# Patient Record
Sex: Male | Born: 1945
Health system: Southern US, Community
[De-identification: ages and names within clinical notes are randomized; demographics above are authoritative.]

## PROBLEM LIST (undated history)

## (undated) DIAGNOSIS — C801 Malignant (primary) neoplasm, unspecified: Secondary | ICD-10-CM

## (undated) DIAGNOSIS — M79609 Pain in unspecified limb: Secondary | ICD-10-CM

## (undated) DIAGNOSIS — K5100 Ulcerative (chronic) pancolitis without complications: Secondary | ICD-10-CM

## (undated) DIAGNOSIS — J45909 Unspecified asthma, uncomplicated: Secondary | ICD-10-CM

## (undated) DIAGNOSIS — F988 Other specified behavioral and emotional disorders with onset usually occurring in childhood and adolescence: Secondary | ICD-10-CM

## (undated) DIAGNOSIS — E785 Hyperlipidemia, unspecified: Secondary | ICD-10-CM

## (undated) DIAGNOSIS — N529 Male erectile dysfunction, unspecified: Secondary | ICD-10-CM

## (undated) DIAGNOSIS — T8852XA Failed moderate sedation during procedure, initial encounter: Secondary | ICD-10-CM

## (undated) DIAGNOSIS — R011 Cardiac murmur, unspecified: Secondary | ICD-10-CM

## (undated) DIAGNOSIS — K219 Gastro-esophageal reflux disease without esophagitis: Secondary | ICD-10-CM

## (undated) DIAGNOSIS — K589 Irritable bowel syndrome without diarrhea: Secondary | ICD-10-CM

## (undated) DIAGNOSIS — F329 Major depressive disorder, single episode, unspecified: Secondary | ICD-10-CM

## (undated) DIAGNOSIS — F411 Generalized anxiety disorder: Secondary | ICD-10-CM

## (undated) DIAGNOSIS — I10 Essential (primary) hypertension: Secondary | ICD-10-CM

## (undated) DIAGNOSIS — M25559 Pain in unspecified hip: Secondary | ICD-10-CM

## (undated) DIAGNOSIS — M899 Disorder of bone, unspecified: Secondary | ICD-10-CM

## (undated) DIAGNOSIS — M949 Disorder of cartilage, unspecified: Secondary | ICD-10-CM

## (undated) DIAGNOSIS — R091 Pleurisy: Secondary | ICD-10-CM

## (undated) DIAGNOSIS — M1990 Unspecified osteoarthritis, unspecified site: Secondary | ICD-10-CM

## (undated) DIAGNOSIS — R079 Chest pain, unspecified: Secondary | ICD-10-CM

## (undated) DIAGNOSIS — S2249XA Multiple fractures of ribs, unspecified side, initial encounter for closed fracture: Secondary | ICD-10-CM

## (undated) DIAGNOSIS — N182 Chronic kidney disease, stage 2 (mild): Secondary | ICD-10-CM

## (undated) HISTORY — DX: Essential (primary) hypertension: I10

## (undated) HISTORY — DX: Pain in unspecified hip: M25.559

## (undated) HISTORY — DX: Multiple fractures of ribs, unspecified side, initial encounter for closed fracture: S22.49XA

## (undated) HISTORY — DX: Chronic kidney disease, stage 2 (mild): N18.2

## (undated) HISTORY — DX: Generalized anxiety disorder: F41.1

## (undated) HISTORY — DX: Unspecified asthma, uncomplicated: J45.909

## (undated) HISTORY — DX: Malignant (primary) neoplasm, unspecified: C80.1

## (undated) HISTORY — DX: Chest pain, unspecified: R07.9

## (undated) HISTORY — DX: Unspecified osteoarthritis, unspecified site: M19.90

## (undated) HISTORY — DX: Irritable bowel syndrome without diarrhea: K58.9

## (undated) HISTORY — DX: Failed moderate sedation during procedure, initial encounter: T88.52XA

## (undated) HISTORY — DX: Pain in unspecified limb: M79.609

## (undated) HISTORY — PX: COLONOSCOPY W/ BIOPSIES: SHX1374

## (undated) HISTORY — DX: Disorder of bone, unspecified: M89.9

## (undated) HISTORY — DX: Hyperlipidemia, unspecified: E78.5

## (undated) HISTORY — DX: Cardiac murmur, unspecified: R01.1

## (undated) HISTORY — DX: Other specified behavioral and emotional disorders with onset usually occurring in childhood and adolescence: F98.8

## (undated) HISTORY — DX: Gastro-esophageal reflux disease without esophagitis: K21.9

## (undated) HISTORY — PX: BASAL CELL CARCINOMA EXCISION: SHX1214

## (undated) HISTORY — DX: Disorder of cartilage, unspecified: M94.9

## (undated) HISTORY — DX: Major depressive disorder, single episode, unspecified: F32.9

## (undated) HISTORY — DX: Male erectile dysfunction, unspecified: N52.9

## (undated) HISTORY — PX: TONSILLECTOMY: SUR1361

## (undated) HISTORY — DX: Ulcerative (chronic) pancolitis without complications: K51.00

---

## 1990-12-30 HISTORY — PX: LUMBAR DISC SURGERY: SHX700

## 1996-12-30 HISTORY — PX: INCISION AND DRAINAGE PERIRECTAL ABSCESS: SHX1804

## 1998-11-08 ENCOUNTER — Encounter: Payer: Self-pay | Admitting: Internal Medicine

## 1998-11-13 ENCOUNTER — Encounter: Payer: Self-pay | Admitting: Internal Medicine

## 1998-11-28 ENCOUNTER — Encounter: Payer: Self-pay | Admitting: Nephrology

## 1998-11-28 ENCOUNTER — Ambulatory Visit (HOSPITAL_COMMUNITY): Admission: RE | Admit: 1998-11-28 | Discharge: 1998-11-28 | Payer: Self-pay | Admitting: Nephrology

## 2000-02-07 ENCOUNTER — Ambulatory Visit (HOSPITAL_COMMUNITY): Admission: RE | Admit: 2000-02-07 | Discharge: 2000-02-07 | Payer: Self-pay | Admitting: Gastroenterology

## 2000-02-07 ENCOUNTER — Encounter (INDEPENDENT_AMBULATORY_CARE_PROVIDER_SITE_OTHER): Payer: Self-pay | Admitting: *Deleted

## 2001-06-29 HISTORY — PX: UPPER GASTROINTESTINAL ENDOSCOPY: SHX188

## 2001-07-14 ENCOUNTER — Encounter (INDEPENDENT_AMBULATORY_CARE_PROVIDER_SITE_OTHER): Payer: Self-pay | Admitting: Specialist

## 2001-07-14 ENCOUNTER — Encounter: Payer: Self-pay | Admitting: Internal Medicine

## 2001-07-14 ENCOUNTER — Other Ambulatory Visit: Admission: RE | Admit: 2001-07-14 | Discharge: 2001-07-14 | Payer: Self-pay | Admitting: Gastroenterology

## 2002-01-30 HISTORY — PX: ELBOW SURGERY: SHX618

## 2004-11-13 ENCOUNTER — Ambulatory Visit: Payer: Self-pay | Admitting: Internal Medicine

## 2004-11-16 ENCOUNTER — Ambulatory Visit: Payer: Self-pay | Admitting: Family Medicine

## 2004-11-20 ENCOUNTER — Encounter: Admission: RE | Admit: 2004-11-20 | Discharge: 2004-11-20 | Payer: Self-pay | Admitting: Family Medicine

## 2004-11-23 ENCOUNTER — Inpatient Hospital Stay (HOSPITAL_COMMUNITY): Admission: EM | Admit: 2004-11-23 | Discharge: 2004-11-25 | Payer: Self-pay | Admitting: Internal Medicine

## 2004-11-23 ENCOUNTER — Ambulatory Visit: Payer: Self-pay | Admitting: Internal Medicine

## 2004-11-28 ENCOUNTER — Encounter: Admission: RE | Admit: 2004-11-28 | Discharge: 2004-11-28 | Payer: Self-pay | Admitting: Internal Medicine

## 2004-12-03 ENCOUNTER — Ambulatory Visit: Payer: Self-pay | Admitting: Internal Medicine

## 2004-12-10 ENCOUNTER — Ambulatory Visit: Payer: Self-pay | Admitting: Internal Medicine

## 2005-01-16 ENCOUNTER — Ambulatory Visit: Payer: Self-pay | Admitting: Internal Medicine

## 2005-04-26 ENCOUNTER — Ambulatory Visit: Payer: Self-pay | Admitting: Internal Medicine

## 2005-05-31 ENCOUNTER — Ambulatory Visit: Payer: Self-pay | Admitting: Internal Medicine

## 2005-10-16 ENCOUNTER — Encounter: Admission: RE | Admit: 2005-10-16 | Discharge: 2005-10-16 | Payer: Self-pay | Admitting: Internal Medicine

## 2006-04-14 ENCOUNTER — Ambulatory Visit: Payer: Self-pay | Admitting: Internal Medicine

## 2006-07-08 ENCOUNTER — Ambulatory Visit: Payer: Self-pay | Admitting: Internal Medicine

## 2006-08-07 ENCOUNTER — Ambulatory Visit: Payer: Self-pay | Admitting: Internal Medicine

## 2006-08-13 ENCOUNTER — Encounter: Payer: Self-pay | Admitting: Internal Medicine

## 2006-08-14 ENCOUNTER — Ambulatory Visit: Payer: Self-pay | Admitting: Internal Medicine

## 2006-08-27 ENCOUNTER — Ambulatory Visit: Payer: Self-pay | Admitting: Psychology

## 2006-09-10 ENCOUNTER — Ambulatory Visit: Payer: Self-pay | Admitting: Gastroenterology

## 2006-09-10 ENCOUNTER — Ambulatory Visit: Payer: Self-pay | Admitting: Psychology

## 2006-09-15 ENCOUNTER — Ambulatory Visit: Payer: Self-pay | Admitting: Internal Medicine

## 2006-09-25 ENCOUNTER — Ambulatory Visit: Payer: Self-pay | Admitting: Gastroenterology

## 2006-09-25 ENCOUNTER — Encounter (INDEPENDENT_AMBULATORY_CARE_PROVIDER_SITE_OTHER): Payer: Self-pay | Admitting: *Deleted

## 2006-09-25 ENCOUNTER — Encounter: Payer: Self-pay | Admitting: Internal Medicine

## 2006-09-29 ENCOUNTER — Ambulatory Visit: Payer: Self-pay | Admitting: Psychology

## 2006-10-08 ENCOUNTER — Ambulatory Visit: Payer: Self-pay | Admitting: Psychology

## 2006-10-28 ENCOUNTER — Ambulatory Visit: Payer: Self-pay | Admitting: Psychology

## 2006-11-05 ENCOUNTER — Ambulatory Visit: Payer: Self-pay | Admitting: Gastroenterology

## 2006-11-06 ENCOUNTER — Ambulatory Visit: Payer: Self-pay | Admitting: Psychology

## 2006-12-10 ENCOUNTER — Ambulatory Visit: Payer: Self-pay | Admitting: Psychology

## 2006-12-16 ENCOUNTER — Ambulatory Visit: Payer: Self-pay | Admitting: Internal Medicine

## 2006-12-18 ENCOUNTER — Ambulatory Visit: Payer: Self-pay | Admitting: Psychology

## 2006-12-26 ENCOUNTER — Ambulatory Visit: Payer: Self-pay | Admitting: Psychology

## 2007-01-21 ENCOUNTER — Ambulatory Visit: Payer: Self-pay | Admitting: Psychology

## 2007-02-04 ENCOUNTER — Ambulatory Visit: Payer: Self-pay | Admitting: Psychology

## 2007-02-19 ENCOUNTER — Ambulatory Visit: Payer: Self-pay | Admitting: Psychology

## 2007-02-24 ENCOUNTER — Ambulatory Visit: Payer: Self-pay | Admitting: Internal Medicine

## 2007-03-04 ENCOUNTER — Ambulatory Visit: Payer: Self-pay | Admitting: Psychology

## 2007-03-27 ENCOUNTER — Ambulatory Visit: Payer: Self-pay | Admitting: Internal Medicine

## 2007-04-02 ENCOUNTER — Ambulatory Visit: Payer: Self-pay | Admitting: Psychology

## 2007-04-09 ENCOUNTER — Ambulatory Visit: Payer: Self-pay

## 2007-04-16 ENCOUNTER — Ambulatory Visit: Payer: Self-pay | Admitting: Psychology

## 2007-04-30 ENCOUNTER — Ambulatory Visit: Payer: Self-pay | Admitting: Psychology

## 2007-05-05 ENCOUNTER — Ambulatory Visit: Payer: Self-pay | Admitting: Internal Medicine

## 2007-05-18 ENCOUNTER — Ambulatory Visit: Payer: Self-pay | Admitting: Psychology

## 2007-06-11 ENCOUNTER — Ambulatory Visit: Payer: Self-pay | Admitting: Psychology

## 2007-07-09 ENCOUNTER — Ambulatory Visit: Payer: Self-pay | Admitting: Psychology

## 2007-08-03 DIAGNOSIS — I10 Essential (primary) hypertension: Secondary | ICD-10-CM

## 2007-08-03 DIAGNOSIS — F3289 Other specified depressive episodes: Secondary | ICD-10-CM

## 2007-08-03 DIAGNOSIS — E785 Hyperlipidemia, unspecified: Secondary | ICD-10-CM

## 2007-08-03 DIAGNOSIS — F411 Generalized anxiety disorder: Secondary | ICD-10-CM

## 2007-08-03 DIAGNOSIS — K219 Gastro-esophageal reflux disease without esophagitis: Secondary | ICD-10-CM | POA: Insufficient documentation

## 2007-08-03 DIAGNOSIS — F329 Major depressive disorder, single episode, unspecified: Secondary | ICD-10-CM

## 2007-08-03 DIAGNOSIS — J45909 Unspecified asthma, uncomplicated: Secondary | ICD-10-CM

## 2007-08-03 DIAGNOSIS — F339 Major depressive disorder, recurrent, unspecified: Secondary | ICD-10-CM | POA: Insufficient documentation

## 2007-08-03 HISTORY — DX: Essential (primary) hypertension: I10

## 2007-08-03 HISTORY — DX: Unspecified asthma, uncomplicated: J45.909

## 2007-08-03 HISTORY — DX: Generalized anxiety disorder: F41.1

## 2007-08-03 HISTORY — DX: Other specified depressive episodes: F32.89

## 2007-08-03 HISTORY — DX: Hyperlipidemia, unspecified: E78.5

## 2007-08-03 HISTORY — DX: Major depressive disorder, single episode, unspecified: F32.9

## 2007-08-03 HISTORY — DX: Gastro-esophageal reflux disease without esophagitis: K21.9

## 2007-08-06 ENCOUNTER — Ambulatory Visit: Payer: Self-pay | Admitting: Psychology

## 2007-08-13 ENCOUNTER — Ambulatory Visit: Payer: Self-pay | Admitting: Internal Medicine

## 2007-08-13 LAB — CONVERTED CEMR LAB
AST: 22 units/L (ref 0–37)
Albumin: 3.6 g/dL (ref 3.5–5.2)
Alkaline Phosphatase: 37 units/L — ABNORMAL LOW (ref 39–117)
BUN: 29 mg/dL — ABNORMAL HIGH (ref 6–23)
Basophils Absolute: 0 10*3/uL (ref 0.0–0.1)
Basophils Relative: 0.1 % (ref 0.0–1.0)
Bilirubin Urine: NEGATIVE
Bilirubin, Direct: 0.1 mg/dL (ref 0.0–0.3)
CO2: 34 meq/L — ABNORMAL HIGH (ref 19–32)
Calcium: 9.2 mg/dL (ref 8.4–10.5)
Chloride: 105 meq/L (ref 96–112)
Cholesterol: 199 mg/dL (ref 0–200)
Creatinine, Ser: 1.6 mg/dL — ABNORMAL HIGH (ref 0.4–1.5)
Eosinophils Absolute: 0.3 10*3/uL (ref 0.0–0.6)
Eosinophils Relative: 3.8 % (ref 0.0–5.0)
GFR calc Af Amer: 57 mL/min
GFR calc non Af Amer: 47 mL/min
Glucose, Bld: 86 mg/dL (ref 70–99)
Glucose, Urine, Semiquant: NEGATIVE
HDL: 55.9 mg/dL (ref >39.0)
Hemoglobin: 14.6 g/dL (ref 13.0–17.0)
Ketones, urine, test strip: NEGATIVE
LDL Cholesterol: 121 mg/dL — ABNORMAL HIGH (ref 0–99)
MCHC: 34.6 g/dL (ref 30.0–36.0)
MCV: 91.8 fL (ref 78.0–100.0)
Monocytes Absolute: 0.9 10*3/uL — ABNORMAL HIGH (ref 0.2–0.7)
Monocytes Relative: 10.8 % (ref 3.0–11.0)
Neutro Abs: 3.9 10*3/uL (ref 1.4–7.7)
Neutrophils Relative %: 47.1 % (ref 43.0–77.0)
Nitrite: NEGATIVE
PSA: 1.35 ng/mL (ref 0.10–4.00)
Platelets: 200 10*3/uL (ref 150–400)
Potassium: 3.9 meq/L (ref 3.5–5.1)
Protein, U semiquant: NEGATIVE
RDW: 12.2 % (ref 11.5–14.6)
Sodium: 144 meq/L (ref 135–145)
TSH: 1.74 microintl units/mL (ref 0.35–5.50)
Total Bilirubin: 0.7 mg/dL (ref 0.3–1.2)
Total CHOL/HDL Ratio: 3.6
Triglycerides: 111 mg/dL (ref 0–149)
Urobilinogen, UA: 0.2
VLDL: 22 mg/dL (ref 0–40)
WBC Urine, dipstick: NEGATIVE
WBC: 8.3 10*3/uL (ref 4.5–10.5)
pH: 6.5

## 2007-08-20 ENCOUNTER — Ambulatory Visit: Payer: Self-pay | Admitting: Internal Medicine

## 2007-08-26 ENCOUNTER — Ambulatory Visit: Payer: Self-pay | Admitting: Psychology

## 2007-09-04 ENCOUNTER — Ambulatory Visit: Payer: Self-pay | Admitting: Internal Medicine

## 2007-09-04 ENCOUNTER — Encounter: Payer: Self-pay | Admitting: Internal Medicine

## 2007-09-14 ENCOUNTER — Ambulatory Visit: Payer: Self-pay | Admitting: Psychology

## 2007-09-30 HISTORY — PX: SHOULDER SURGERY: SHX246

## 2007-12-10 ENCOUNTER — Telehealth: Payer: Self-pay | Admitting: Internal Medicine

## 2008-01-05 ENCOUNTER — Encounter: Payer: Self-pay | Admitting: Internal Medicine

## 2008-01-12 ENCOUNTER — Telehealth: Payer: Self-pay | Admitting: Internal Medicine

## 2008-02-03 ENCOUNTER — Telehealth: Payer: Self-pay | Admitting: Internal Medicine

## 2008-02-23 ENCOUNTER — Ambulatory Visit: Payer: Self-pay | Admitting: Internal Medicine

## 2008-02-23 DIAGNOSIS — N529 Male erectile dysfunction, unspecified: Secondary | ICD-10-CM | POA: Insufficient documentation

## 2008-02-23 DIAGNOSIS — F988 Other specified behavioral and emotional disorders with onset usually occurring in childhood and adolescence: Secondary | ICD-10-CM

## 2008-02-23 HISTORY — DX: Other specified behavioral and emotional disorders with onset usually occurring in childhood and adolescence: F98.8

## 2008-02-23 HISTORY — DX: Male erectile dysfunction, unspecified: N52.9

## 2008-02-25 ENCOUNTER — Telehealth: Payer: Self-pay | Admitting: Internal Medicine

## 2008-02-25 LAB — CONVERTED CEMR LAB: Testosterone: 251.61 ng/dL — ABNORMAL LOW (ref 350.00–890)

## 2008-05-09 ENCOUNTER — Ambulatory Visit: Payer: Self-pay | Admitting: Internal Medicine

## 2008-05-27 ENCOUNTER — Ambulatory Visit: Payer: Self-pay | Admitting: Internal Medicine

## 2008-05-27 DIAGNOSIS — M25559 Pain in unspecified hip: Secondary | ICD-10-CM

## 2008-05-27 DIAGNOSIS — M5136 Other intervertebral disc degeneration, lumbar region: Secondary | ICD-10-CM | POA: Insufficient documentation

## 2008-05-27 HISTORY — DX: Pain in unspecified hip: M25.559

## 2008-05-27 LAB — CONVERTED CEMR LAB
ALT: 24 units/L (ref 0–53)
AST: 28 units/L (ref 0–37)
Albumin: 3.8 g/dL (ref 3.5–5.2)
BUN: 24 mg/dL — ABNORMAL HIGH (ref 6–23)
Basophils Absolute: 0 10*3/uL (ref 0.0–0.1)
Basophils Relative: 0.5 % (ref 0.0–1.0)
CO2: 32 meq/L (ref 19–32)
Calcium: 9.3 mg/dL (ref 8.4–10.5)
Chloride: 99 meq/L (ref 96–112)
Creatinine, Ser: 1.6 mg/dL — ABNORMAL HIGH (ref 0.4–1.5)
Eosinophils Relative: 0.1 % (ref 0.0–5.0)
GFR calc Af Amer: 57 mL/min
GFR calc non Af Amer: 47 mL/min
Glucose, Bld: 103 mg/dL — ABNORMAL HIGH (ref 70–99)
HCT: 42.7 % (ref 39.0–52.0)
Hemoglobin: 14.7 g/dL (ref 13.0–17.0)
Lymphocytes Relative: 15.6 % (ref 12.0–46.0)
MCHC: 34.5 g/dL (ref 30.0–36.0)
Monocytes Relative: 6.6 % (ref 3.0–12.0)
Neutro Abs: 7.1 10*3/uL (ref 1.4–7.7)
Neutrophils Relative %: 77.2 % — ABNORMAL HIGH (ref 43.0–77.0)
Platelets: 217 10*3/uL (ref 150–400)
RBC: 4.64 M/uL (ref 4.22–5.81)
RDW: 12.2 % (ref 11.5–14.6)
Sodium: 140 meq/L (ref 135–145)
Total Bilirubin: 1.1 mg/dL (ref 0.3–1.2)
WBC: 9.1 10*3/uL (ref 4.5–10.5)

## 2008-05-31 ENCOUNTER — Encounter: Payer: Self-pay | Admitting: Internal Medicine

## 2008-06-26 ENCOUNTER — Emergency Department (HOSPITAL_BASED_OUTPATIENT_CLINIC_OR_DEPARTMENT_OTHER): Admission: EM | Admit: 2008-06-26 | Discharge: 2008-06-26 | Payer: Self-pay | Admitting: Emergency Medicine

## 2008-06-28 ENCOUNTER — Telehealth: Payer: Self-pay | Admitting: Internal Medicine

## 2008-06-30 ENCOUNTER — Ambulatory Visit: Payer: Self-pay | Admitting: Internal Medicine

## 2008-06-30 DIAGNOSIS — S2249XA Multiple fractures of ribs, unspecified side, initial encounter for closed fracture: Secondary | ICD-10-CM

## 2008-06-30 HISTORY — DX: Multiple fractures of ribs, unspecified side, initial encounter for closed fracture: S22.49XA

## 2008-07-08 ENCOUNTER — Telehealth: Payer: Self-pay | Admitting: Internal Medicine

## 2008-07-26 ENCOUNTER — Ambulatory Visit: Payer: Self-pay | Admitting: Internal Medicine

## 2008-07-26 DIAGNOSIS — M899 Disorder of bone, unspecified: Secondary | ICD-10-CM

## 2008-07-26 DIAGNOSIS — M949 Disorder of cartilage, unspecified: Secondary | ICD-10-CM

## 2008-07-26 HISTORY — DX: Disorder of bone, unspecified: M89.9

## 2008-08-11 ENCOUNTER — Ambulatory Visit (HOSPITAL_COMMUNITY): Admission: RE | Admit: 2008-08-11 | Discharge: 2008-08-11 | Payer: Self-pay | Admitting: Internal Medicine

## 2008-08-11 ENCOUNTER — Encounter: Payer: Self-pay | Admitting: Internal Medicine

## 2008-08-11 LAB — HM COLONOSCOPY

## 2008-08-18 ENCOUNTER — Ambulatory Visit: Payer: Self-pay | Admitting: Internal Medicine

## 2008-08-19 ENCOUNTER — Encounter (INDEPENDENT_AMBULATORY_CARE_PROVIDER_SITE_OTHER): Payer: Self-pay

## 2008-09-21 ENCOUNTER — Ambulatory Visit: Payer: Self-pay | Admitting: Internal Medicine

## 2008-09-23 ENCOUNTER — Encounter: Payer: Self-pay | Admitting: Internal Medicine

## 2008-09-23 ENCOUNTER — Ambulatory Visit: Payer: Self-pay | Admitting: Internal Medicine

## 2008-09-26 ENCOUNTER — Encounter: Payer: Self-pay | Admitting: Internal Medicine

## 2008-10-03 ENCOUNTER — Encounter: Payer: Self-pay | Admitting: Internal Medicine

## 2008-10-03 LAB — CONVERTED CEMR LAB: Creatinine, Urine: 66.1 mg/dL

## 2008-10-04 LAB — CONVERTED CEMR LAB: Protein, Ur: 30 mg/24hr — ABNORMAL LOW (ref 50–100)

## 2008-10-19 ENCOUNTER — Ambulatory Visit: Payer: Self-pay | Admitting: Internal Medicine

## 2008-10-19 LAB — CONVERTED CEMR LAB
ALT: 23 units/L (ref 0–53)
AST: 29 units/L (ref 0–37)
Albumin: 3.7 g/dL (ref 3.5–5.2)
Alkaline Phosphatase: 41 units/L (ref 39–117)
BUN: 26 mg/dL — ABNORMAL HIGH (ref 6–23)
Basophils Absolute: 0.1 10*3/uL (ref 0.0–0.1)
Basophils Relative: 0.9 % (ref 0.0–3.0)
Bilirubin, Direct: 0.1 mg/dL (ref 0.0–0.3)
CO2: 31 meq/L (ref 19–32)
Calcium: 8.9 mg/dL (ref 8.4–10.5)
Chloride: 106 meq/L (ref 96–112)
Cholesterol: 202 mg/dL (ref 0–200)
Creatinine, Ser: 1.6 mg/dL — ABNORMAL HIGH (ref 0.4–1.5)
Direct LDL: 103.6 mg/dL
Eosinophils Absolute: 0.3 10*3/uL (ref 0.0–0.7)
Eosinophils Relative: 4.4 % (ref 0.0–5.0)
GFR calc Af Amer: 57 mL/min
GFR calc non Af Amer: 47 mL/min
Glucose, Bld: 94 mg/dL (ref 70–99)
HCT: 40.2 % (ref 39.0–52.0)
HDL: 62.9 mg/dL (ref >39.0)
Hemoglobin: 14 g/dL (ref 13.0–17.0)
Lymphocytes Relative: 43.2 % (ref 12.0–46.0)
MCHC: 34.7 g/dL (ref 30.0–36.0)
MCV: 92.3 fL (ref 78.0–100.0)
Monocytes Absolute: 0.8 10*3/uL (ref 0.1–1.0)
Monocytes Relative: 12.4 % — ABNORMAL HIGH (ref 3.0–12.0)
Neutro Abs: 2.6 10*3/uL (ref 1.4–7.7)
Neutrophils Relative %: 39.1 % — ABNORMAL LOW (ref 43.0–77.0)
PSA: 0.89 ng/mL (ref 0.10–4.00)
Platelets: 172 10*3/uL (ref 150–400)
Potassium: 4.1 meq/L (ref 3.5–5.1)
RBC: 4.36 M/uL (ref 4.22–5.81)
RDW: 12.3 % (ref 11.5–14.6)
Sodium: 146 meq/L — ABNORMAL HIGH (ref 135–145)
TSH: 1.4 microintl units/mL (ref 0.35–5.50)
Total Bilirubin: 0.7 mg/dL (ref 0.3–1.2)
Total CHOL/HDL Ratio: 3.2
Total Protein: 6.4 g/dL (ref 6.0–8.3)
Triglycerides: 70 mg/dL (ref 0–149)
VLDL: 14 mg/dL (ref 0–40)
WBC: 6.7 10*3/uL (ref 4.5–10.5)

## 2008-10-25 ENCOUNTER — Encounter: Payer: Self-pay | Admitting: Internal Medicine

## 2008-10-26 ENCOUNTER — Ambulatory Visit: Payer: Self-pay | Admitting: Internal Medicine

## 2008-11-02 ENCOUNTER — Ambulatory Visit: Payer: Self-pay | Admitting: Internal Medicine

## 2008-11-02 DIAGNOSIS — M1990 Unspecified osteoarthritis, unspecified site: Secondary | ICD-10-CM | POA: Insufficient documentation

## 2008-11-02 DIAGNOSIS — K589 Irritable bowel syndrome without diarrhea: Secondary | ICD-10-CM | POA: Insufficient documentation

## 2008-11-02 DIAGNOSIS — T8852XA Failed moderate sedation during procedure, initial encounter: Secondary | ICD-10-CM | POA: Insufficient documentation

## 2008-11-02 HISTORY — DX: Unspecified osteoarthritis, unspecified site: M19.90

## 2008-11-02 HISTORY — DX: Failed moderate sedation during procedure, initial encounter: T88.52XA

## 2008-11-02 HISTORY — DX: Irritable bowel syndrome, unspecified: K58.9

## 2008-11-04 DIAGNOSIS — N182 Chronic kidney disease, stage 2 (mild): Secondary | ICD-10-CM

## 2008-11-04 DIAGNOSIS — N183 Chronic kidney disease, stage 3 unspecified: Secondary | ICD-10-CM | POA: Insufficient documentation

## 2008-11-04 HISTORY — DX: Chronic kidney disease, stage 2 (mild): N18.2

## 2008-11-07 ENCOUNTER — Telehealth: Payer: Self-pay | Admitting: Internal Medicine

## 2008-11-10 ENCOUNTER — Telehealth: Payer: Self-pay | Admitting: Internal Medicine
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
## 2008-11-16 ENCOUNTER — Telehealth: Payer: Self-pay | Admitting: Internal Medicine

## 2008-11-17 ENCOUNTER — Ambulatory Visit: Payer: Self-pay | Admitting: Internal Medicine

## 2008-11-17 ENCOUNTER — Telehealth: Payer: Self-pay | Admitting: Internal Medicine

## 2008-11-17 DIAGNOSIS — R079 Chest pain, unspecified: Secondary | ICD-10-CM

## 2008-11-17 HISTORY — DX: Chest pain, unspecified: R07.9

## 2008-11-18 ENCOUNTER — Ambulatory Visit: Payer: Self-pay | Admitting: Cardiology

## 2008-11-22 LAB — CONVERTED CEMR LAB
ALT: 23 units/L (ref 0–53)
AST: 28 units/L (ref 0–37)
Albumin: 3.8 g/dL (ref 3.5–5.2)
Alkaline Phosphatase: 37 units/L — ABNORMAL LOW (ref 39–117)
Basophils Absolute: 0 10*3/uL (ref 0.0–0.1)
Basophils Relative: 0.7 % (ref 0.0–3.0)
CO2: 29 meq/L (ref 19–32)
Calcium: 9.2 mg/dL (ref 8.4–10.5)
Chloride: 102 meq/L (ref 96–112)
Creatinine, Ser: 1.6 mg/dL — ABNORMAL HIGH (ref 0.4–1.5)
Eosinophils Absolute: 0 10*3/uL (ref 0.0–0.7)
Eosinophils Relative: 0.4 % (ref 0.0–5.0)
GFR calc Af Amer: 57 mL/min
GFR calc non Af Amer: 47 mL/min
Glucose, Bld: 128 mg/dL — ABNORMAL HIGH (ref 70–99)
HCT: 40.5 % (ref 39.0–52.0)
Hemoglobin: 13.9 g/dL (ref 13.0–17.0)
Lymphocytes Relative: 22.3 % (ref 12.0–46.0)
MCHC: 34.4 g/dL (ref 30.0–36.0)
MCV: 91.8 fL (ref 78.0–100.0)
Monocytes Absolute: 0.4 10*3/uL (ref 0.1–1.0)
Monocytes Relative: 6.6 % (ref 3.0–12.0)
Neutro Abs: 4.8 10*3/uL (ref 1.4–7.7)
Neutrophils Relative %: 70 % (ref 43.0–77.0)
Platelets: 188 10*3/uL (ref 150–400)
RBC: 4.41 M/uL (ref 4.22–5.81)
Sodium: 140 meq/L (ref 135–145)
Total Bilirubin: 0.7 mg/dL (ref 0.3–1.2)
Total Protein: 6.4 g/dL (ref 6.0–8.3)

## 2008-11-29 ENCOUNTER — Encounter: Payer: Self-pay | Admitting: Internal Medicine

## 2008-11-29 ENCOUNTER — Ambulatory Visit: Payer: Self-pay

## 2008-12-27 ENCOUNTER — Ambulatory Visit: Payer: Self-pay | Admitting: Internal Medicine

## 2009-01-05 ENCOUNTER — Encounter: Payer: Self-pay | Admitting: Internal Medicine

## 2009-01-19 ENCOUNTER — Ambulatory Visit: Payer: Self-pay | Admitting: Internal Medicine

## 2009-01-25 ENCOUNTER — Ambulatory Visit: Payer: Self-pay | Admitting: Internal Medicine

## 2009-01-26 DIAGNOSIS — K5100 Ulcerative (chronic) pancolitis without complications: Secondary | ICD-10-CM

## 2009-01-26 HISTORY — DX: Ulcerative (chronic) pancolitis without complications: K51.00

## 2009-04-05 ENCOUNTER — Telehealth: Payer: Self-pay | Admitting: Internal Medicine

## 2009-05-24 ENCOUNTER — Ambulatory Visit: Payer: Self-pay | Admitting: Internal Medicine

## 2009-06-20 ENCOUNTER — Ambulatory Visit: Payer: Self-pay | Admitting: Internal Medicine

## 2009-06-20 DIAGNOSIS — M79609 Pain in unspecified limb: Secondary | ICD-10-CM

## 2009-06-20 HISTORY — DX: Pain in unspecified limb: M79.609

## 2009-06-30 ENCOUNTER — Telehealth: Payer: Self-pay | Admitting: Family Medicine

## 2009-07-25 ENCOUNTER — Encounter (INDEPENDENT_AMBULATORY_CARE_PROVIDER_SITE_OTHER): Payer: Self-pay | Admitting: *Deleted

## 2009-07-31 ENCOUNTER — Telehealth: Payer: Self-pay | Admitting: Internal Medicine

## 2009-08-02 ENCOUNTER — Encounter: Admission: RE | Admit: 2009-08-02 | Discharge: 2009-08-02 | Payer: Self-pay | Admitting: Specialist

## 2009-09-11 ENCOUNTER — Telehealth: Payer: Self-pay | Admitting: Internal Medicine

## 2009-09-19 ENCOUNTER — Ambulatory Visit: Payer: Self-pay | Admitting: Internal Medicine

## 2009-09-20 ENCOUNTER — Encounter: Payer: Self-pay | Admitting: Internal Medicine

## 2009-09-20 LAB — CONVERTED CEMR LAB
Cholesterol: 323 mg/dL — ABNORMAL HIGH (ref 0–200)
Direct LDL: 240.3 mg/dL
HDL: 51.6 mg/dL (ref >39.00)
Total CHOL/HDL Ratio: 6
Triglycerides: 156 mg/dL — ABNORMAL HIGH (ref 0.0–149.0)
VLDL: 31.2 mg/dL (ref 0.0–40.0)

## 2009-10-02 ENCOUNTER — Telehealth: Payer: Self-pay | Admitting: Internal Medicine

## 2009-10-19 ENCOUNTER — Ambulatory Visit: Payer: Self-pay | Admitting: Internal Medicine

## 2009-10-19 ENCOUNTER — Encounter: Payer: Self-pay | Admitting: Internal Medicine

## 2009-12-20 ENCOUNTER — Ambulatory Visit: Payer: Self-pay | Admitting: Internal Medicine

## 2009-12-20 LAB — CONVERTED CEMR LAB
ALT: 24 units/L (ref 0–53)
Alkaline Phosphatase: 50 units/L (ref 39–117)
BUN: 29 mg/dL — ABNORMAL HIGH (ref 6–23)
Basophils Relative: 0.4 % (ref 0.0–3.0)
Bilirubin, Direct: 0 mg/dL (ref 0.0–0.3)
Calcium: 9.6 mg/dL (ref 8.4–10.5)
Cholesterol: 203 mg/dL — ABNORMAL HIGH (ref 0–200)
Creatinine, Ser: 1.8 mg/dL — ABNORMAL HIGH (ref 0.4–1.5)
Direct LDL: 119.9 mg/dL
Eosinophils Relative: 4 % (ref 0.0–5.0)
GFR calc non Af Amer: 40.68 mL/min (ref >60)
HCT: 41.6 % (ref 39.0–52.0)
HDL: 67.3 mg/dL (ref >39.00)
Lymphocytes Relative: 31.4 % (ref 12.0–46.0)
Lymphs Abs: 2.5 10*3/uL (ref 0.7–4.0)
MCHC: 33.8 g/dL (ref 30.0–36.0)
Monocytes Absolute: 0.9 10*3/uL (ref 0.1–1.0)
Monocytes Relative: 11.7 % (ref 3.0–12.0)
Neutro Abs: 4.4 10*3/uL (ref 1.4–7.7)
Neutrophils Relative %: 52.5 % (ref 43.0–77.0)
PSA: 0.59 ng/mL (ref 0.10–4.00)
Platelets: 189 10*3/uL (ref 150.0–400.0)
Potassium: 4.2 meq/L (ref 3.5–5.1)
RBC: 4.29 M/uL (ref 4.22–5.81)
TSH: 1.44 microintl units/mL (ref 0.35–5.50)
Total Bilirubin: 0.8 mg/dL (ref 0.3–1.2)
Total CHOL/HDL Ratio: 3
Total Protein: 6.6 g/dL (ref 6.0–8.3)
VLDL: 18.4 mg/dL (ref 0.0–40.0)
WBC: 8.1 10*3/uL (ref 4.5–10.5)

## 2010-01-01 ENCOUNTER — Telehealth: Payer: Self-pay | Admitting: Internal Medicine

## 2010-01-11 ENCOUNTER — Telehealth: Payer: Self-pay | Admitting: Internal Medicine

## 2010-01-16 ENCOUNTER — Encounter: Admission: RE | Admit: 2010-01-16 | Discharge: 2010-01-16 | Payer: Self-pay | Admitting: Specialist

## 2010-02-21 ENCOUNTER — Telehealth: Payer: Self-pay | Admitting: Internal Medicine

## 2010-02-27 HISTORY — PX: HAND SURGERY: SHX662

## 2010-03-20 ENCOUNTER — Ambulatory Visit: Payer: Self-pay | Admitting: Internal Medicine

## 2010-04-03 ENCOUNTER — Ambulatory Visit: Payer: Self-pay | Admitting: Internal Medicine

## 2010-04-05 LAB — CONVERTED CEMR LAB
BUN: 27 mg/dL — ABNORMAL HIGH (ref 6–23)
CO2: 30 meq/L (ref 19–32)
Chloride: 102 meq/L (ref 96–112)
Creatinine, Ser: 1.8 mg/dL — ABNORMAL HIGH (ref 0.4–1.5)
Glucose, Bld: 95 mg/dL (ref 70–99)
Potassium: 4.5 meq/L (ref 3.5–5.1)
Sodium: 140 meq/L (ref 135–145)

## 2010-05-29 ENCOUNTER — Telehealth: Payer: Self-pay | Admitting: Internal Medicine

## 2010-07-17 ENCOUNTER — Ambulatory Visit: Payer: Self-pay | Admitting: Internal Medicine

## 2010-09-11 ENCOUNTER — Telehealth: Payer: Self-pay | Admitting: Internal Medicine

## 2010-09-21 ENCOUNTER — Telehealth: Payer: Self-pay | Admitting: Internal Medicine

## 2010-09-25 ENCOUNTER — Telehealth: Payer: Self-pay | Admitting: Internal Medicine

## 2010-11-01 ENCOUNTER — Telehealth: Payer: Self-pay | Admitting: Internal Medicine

## 2010-11-19 ENCOUNTER — Telehealth: Payer: Self-pay | Admitting: Internal Medicine

## 2010-11-20 ENCOUNTER — Ambulatory Visit: Payer: Self-pay | Admitting: Internal Medicine

## 2010-11-20 DIAGNOSIS — M533 Sacrococcygeal disorders, not elsewhere classified: Secondary | ICD-10-CM | POA: Insufficient documentation

## 2010-11-21 ENCOUNTER — Ambulatory Visit: Payer: Self-pay | Admitting: Internal Medicine

## 2010-11-21 LAB — CONVERTED CEMR LAB: Vit D, 25-Hydroxy: 56 ng/mL (ref 30–89)

## 2010-12-26 ENCOUNTER — Encounter: Payer: Self-pay | Admitting: Internal Medicine

## 2010-12-30 HISTORY — PX: CARPAL TUNNEL RELEASE: SHX101

## 2010-12-30 HISTORY — PX: ANTERIOR FUSION CERVICAL SPINE: SUR626

## 2011-01-23 ENCOUNTER — Ambulatory Visit: Admit: 2011-01-23 | Payer: Self-pay | Admitting: Family Medicine

## 2011-01-26 ENCOUNTER — Encounter: Payer: Self-pay | Admitting: Internal Medicine
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
## 2011-01-29 NOTE — Assessment & Plan Note (Signed)
Summary: 4 month rov/njr   Vital Signs:  Patient profile:   65 year old male Weight:      147 pounds Temp:     98.1 degrees F oral BP sitting:   110 / 66  (right arm) Cuff size:   regular  Vitals Entered By: Duard Brady LPN (November 21, 2010 7:57 AM) CC: 4 mos rov - doing ok   **declines flu vaccine Is Patient Diabetic? No   Primary Care Provider:  Eleonore Chiquito, MD  CC:  4 mos rov - doing ok   **declines flu vaccine.  History of Present Illness: 65 year old patient who has a history of inflammatory bowel disease.  His last two colonoscopies have been negative and it is felt by GI at that this is fairly inactive.  He also has IBS.  He has osteoporosis and Fosamax was discontinued a few months ago after a greater than 5 years of use.  Remains on calcium and vitamin D supplementation.  Attempts at tapering prednisone, less than 5 mg have resulted in a flare of his symptoms.  This was discussed with the patient and it is more likely that symptoms are related to IBS or possibly the prednisone taper itself.  Will attempt a very slow taper.  He has hypertension and dyslipidemia, which has been stable.  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  His asthma has been stable  Allergies: 1)  ! Novocain 2)  ! Purinethol (Mercaptopurine) 3)  ! Adhesive Tape 4)  Aspirin (Aspirin)  Past History:  Past Medical History: Reviewed history from 11/20/2010 and no changes required. Asthma Hyperlipidemia Hypertension GERD Anxiety Depression Ulcerative colitis Renal insufficiency  Osteopenia DEXA 10/10 ADD Arthritis Irritable Bowel Syndrome ED  Past Surgical History: Reviewed history from 07/17/2010 and no changes required. Colonoscopy-07/2008 left elbow surgery in February 2003 left shoulder surgery, October 2008, injury at work, dislocated I&D perirectal abscess 1998 Lumbar disk 1992 bone density 2008 2010 Hand surgery 02/2010 basal thumb jont  removal and tendon transplant ([place])  Review of Systems  The patient denies anorexia, fever, weight loss, weight gain, vision loss, decreased hearing, hoarseness, chest pain, syncope, dyspnea on exertion, peripheral edema, prolonged cough, headaches, hemoptysis, abdominal pain, melena, hematochezia, severe indigestion/heartburn, hematuria, incontinence, genital sores, muscle weakness, suspicious skin lesions, transient blindness, difficulty walking, depression, unusual weight change, abnormal bleeding, enlarged lymph nodes, angioedema, breast masses, and testicular masses.    Physical Exam  General:  Well-developed,well-nourished,in no acute distress; alert,appropriate and cooperative throughout examination Head:  Normocephalic and atraumatic without obvious abnormalities. No apparent alopecia or balding. Eyes:  No corneal or conjunctival inflammation noted. EOMI. Perrla. Funduscopic exam benign, without hemorrhages, exudates or papilledema. Vision grossly normal. Mouth:  Oral mucosa and oropharynx without lesions or exudates.  Teeth in good repair. Neck:  No deformities, masses, or tenderness noted. Lungs:  Normal respiratory effort, chest expands symmetrically. Lungs are clear to auscultation, no crackles or wheezes. Heart:  Normal rate and regular rhythm. S1 and S2 normal without gallop, murmur, click, rub or other extra sounds. Abdomen:  Bowel sounds positive,abdomen soft and non-tender without masses, organomegaly or hernias noted. Msk:  No deformity or scoliosis noted of thoracic or lumbar spine.   Pulses:  R and L carotid,radial,femoral,dorsalis pedis and posterior tibial pulses are full and equal bilaterally Extremities:  No clubbing, cyanosis, edema, or deformity noted with normal full range of motion of all joints.     Impression & Recommendations:  Problem # 1:  IRRITABLE BOWEL SYNDROME (ICD-564.1)  Problem # 2:  OSTEOPENIA (ICD-733.90)  Orders: Venipuncture (21308) Specimen  Handling (65784)  Problem # 3:  ADD (ICD-314.00)  Problem # 4:  HYPERTENSION (ICD-401.9)  His updated medication list for this problem includes:    Benazepril-hydrochlorothiazide 20-12.5 Mg Tabs (Benazepril-hydrochlorothiazide) .Marland Kitchen... Take 1 tablet by mouth once a day  His updated medication list for this problem includes:    Benazepril-hydrochlorothiazide 20-12.5 Mg Tabs (Benazepril-hydrochlorothiazide) .Marland Kitchen... Take 1 tablet by mouth once a day  Problem # 5:  HYPERLIPIDEMIA (ICD-272.4)  His updated medication list for this problem includes:    Zocor 80 Mg Tabs (Simvastatin) .Marland Kitchen... Take 1 tablet by mouth at bedtime  His updated medication list for this problem includes:    Zocor 80 Mg Tabs (Simvastatin) .Marland Kitchen... Take 1 tablet by mouth at bedtime  Complete Medication List: 1)  Adderall Xr 10 Mg Cp24 (Amphetamine-dextroamphetamine) .... Take 1 tablet by mouth every morning 2)  Omeprazole 20 Mg Cpdr (Omeprazole) .Marland Kitchen.. 1 once a day 3)  Hyoscyamine Sulfate Cr 0.375 Mg Xr12h-tab (Hyoscyamine sulfate) .... One tablet by mouth once daily 4)  Alprazolam 0.5 Mg Tbdp (Alprazolam) .... Take 1/2 tablet by mouth once daily with adderall 5)  Zocor 80 Mg Tabs (Simvastatin) .... Take 1 tablet by mouth at bedtime 6)  Benazepril-hydrochlorothiazide 20-12.5 Mg Tabs (Benazepril-hydrochlorothiazide) .... Take 1 tablet by mouth once a day 7)  Androgel 50 Mg/5gm Gel (Testosterone) .Marland Kitchen.. 1 once daily 8)  Multivitamins Tabs (Multiple vitamin) .... Take 1 tablet by mouth once a day 9)  Allegra-d 12 Hour 60-120 Mg Xr12h-tab (Fexofenadine-pseudoephedrine) .... One twice daily as needed 10)  Ambien 10 Mg Tabs (Zolpidem tartrate) .... Take 1 tablet by mouth at bedtime as needed 11)  Budeprion Sr 150 Mg Xr12h-tab (Bupropion hcl) .... Take 1 tablet by mouth once a day in morning 12)  Upcal D 500-250 Mg-unit Pack (Calcium citrate-vitamin d) .Marland Kitchen.. 1 two times a day 13)  Proair Hfa 108 (90 Base) Mcg/act Aers (Albuterol sulfate)  .... 2 puffs qid prn 14)  Vitamin C 1000 Mg Tabs (Ascorbic acid) .... Once daily 15)  Prednisone 5 Mg Tabs (Prednisone) .... One half tablet  daily as directed 16)  Prednisone 1 Mg Tabs (Prednisone) .... Two tablets daily  Patient Instructions: 1)  It is important that you exercise regularly at least 20 minutes 5 times a week. If you develop chest pain, have severe difficulty breathing, or feel very tired , stop exercising immediately and seek medical attention. 2)  Take calcium +Vitamin D daily. 3)  prednisone 4.5 milligrams daily for one month, then 3.5 mg daily for two months, then 2.5 mg daily 4)  Please schedule a follow-up appointment in 4 months for CPX 5)  Advised not to eat any food or drink any liquids after 10 PM the night before your procedure. Prescriptions: AMBIEN 10 MG TABS (ZOLPIDEM TARTRATE) Take 1 tablet by mouth at bedtime as needed  #50 x 3   Entered and Authorized by:   Gordy Savers  MD   Signed by:   Gordy Savers  MD on 11/21/2010   Method used:   Print then Give to Patient   RxID:   [id number] ANDROGEL 50 MG/5GM  GEL (TESTOSTERONE) 1 once daily  #30 x 6   Entered and Authorized by:   Gordy Savers  MD   Signed by:   Gordy Savers  MD on 11/21/2010   Method used:   Print then Give  to Patient   RxID:   [id number] ALPRAZOLAM 0.5 MG TBDP (ALPRAZOLAM) Take 1/2 tablet by mouth once daily with adderall  #50 x 4   Entered and Authorized by:   Gordy Savers  MD   Signed by:   Gordy Savers  MD on 11/21/2010   Method used:   Print then Give to Patient   RxID:   [id number] ADDERALL XR 10 MG CP24 (AMPHETAMINE-DEXTROAMPHETAMINE) Take 1 tablet by mouth every morning  #30 x 0   Entered and Authorized by:   Gordy Savers  MD   Signed by:   Gordy Savers  MD on 11/21/2010   Method used:   Print then Give to Patient   RxID:   [id number] ADDERALL XR 10 MG CP24 (AMPHETAMINE-DEXTROAMPHETAMINE) Take 1 tablet by  mouth every morning  #30 x 0   Entered and Authorized by:   Gordy Savers  MD   Signed by:   Gordy Savers  MD on 11/21/2010   Method used:   Print then Give to Patient   RxID:   [id number] ADDERALL XR 10 MG CP24 (AMPHETAMINE-DEXTROAMPHETAMINE) Take 1 tablet by mouth every morning  #30 x 0   Entered and Authorized by:   Gordy Savers  MD   Signed by:   Gordy Savers  MD on 11/21/2010   Method used:   Print then Give to Patient   RxID:   [id number] PREDNISONE 1 MG TABS (PREDNISONE) two tablets daily  #100 x 2   Entered and Authorized by:   Gordy Savers  MD   Signed by:   Gordy Savers  MD on 11/21/2010   Method used:   Electronically to        [facility] (retail)       [address]       [place], [place]  [address]       Ph: [phone]       Fax: [phone]   RxID:   [id number] PREDNISONE 5 MG TABS (PREDNISONE) one half tablet  daily as directed  #100 x 2   Entered and Authorized by:   Gordy Savers  MD   Signed by:   Gordy Savers  MD on 11/21/2010   Method used:   Electronically to        [facility] ([facility]ail[facility]       [address]       [place], [place]  [address]       Ph: [phone]       Fax: [phone]   RxID:   [id number] PROAIR HFA 108 ([phone] BASE) MCG/ACT AERS (ALBUTEROL SULFATE) 2 puffs qid prn  #1 x 6   Entered and Authorized by:   Gordy Savers  MD   Signed by:   Gordy Savers  MD on 11/21/2010   Method used:   Electronically to        [facility]       [address]       [place], [place]  [address]       Ph: [phone]       Fax: [phone]   RxID:   [id number] BUDEPRION SR 150 MG XR12H-TAB (BUPROPION HCL) Take 1 tablet by mouth once a  day in morning  #90 x 6   Entered and Authorized by:   Gordy Savers   MD   Signed by:   Gordy Savers  MD on 11/21/2010   Method used:   Electronically to        [facility] ([facility]       [address]       [place], [place]  [address]       Ph: [phone]       Fax: [phone]   RxID:   [id number] BENAZEPRIL-HYDROCHLOROTHIAZIDE 20-12.5 MG  TABS (BENAZEPRIL-HYDROCHLOROTHIAZIDE) Take 1 tablet by mouth once a day  #90 x 6   Entered and Authorized by:   Gordy Savers  MD   Signed by:   Gordy Savers  MD on 11/21/2010   Method used:   Electronically to        [facility] ([facility]       [address]       [place], [place]  [address]       Ph: [phone]       Fax: [phone]   RxID:   [id number] ZOCOR 80 MG TABS (SIMVASTATIN) Take 1 tablet by mouth at bedtime  #90 x 6   Entered and Authorized by:   Gordy Savers  MD   Signed by:   Gordy Savers  MD on 11/21/2010   Method used:   Electronically to        [facility] ([facility]       [address]       [place], [place]  [address]       Ph: [phone]       Fax: [phone]   RxID:   [id number] HYOSCYAMINE SULFATE CR 0.375 MG XR12H-TAB (HYOSCYAMINE SULFATE) one tablet by mouth once daily  #90 x 3   Entered and Authorized by:   Gordy Savers  MD   Signed by:   Gordy Savers  MD on 11/21/2010   Method used:   Electronically to        [facility] ([facility]       [address]       [place], [place]  [address]       Ph: [phone]       Fax: [phone]   RxID:   [id number] OMEPRAZOLE 20 MG CPDR (OMEPRAZOLE) 1 once a day  #90 x 6   Entered and Authorized by:   Gordy Savers  MD   Signed by:   Gordy Savers  MD on 11/21/2010   Method used:   Electronically to        [facility] ([facility]        [address]       [place], [place]  [address]       Ph: [phone]       Fax: [phone]   RxID:   [id number]    Orders Added: 1)  Est. Patient Level IV [58099] 2)  Venipuncture [83382] 3)  Specimen Handling [99000]

## 2011-01-29 NOTE — Progress Notes (Signed)
Summary: prednisone refill  Phone Note Call from Patient   Caller: Spouse Summary of Call: requesting prednisone refill to [facility] [address]- would like to pick up today. KIK Initial call taken by: Duard Brady LPN,  November 01, 2010 2:24 PM  Follow-up for Phone Call        i do see where erquest was made to other doctor. Please advise. KIk Follow-up by: Duard Brady LPN,  November 01, 2010 2:25 PM  Additional Follow-up for Phone Call Additional follow up Details #1::        Prednisone 5mg   #90  RF 3 Additional Follow-up by: Gordy Savers  MD,  November 01, 2010 4:42 PM    Additional Follow-up for Phone Call Additional follow up Details #2::    called hm # - ans mach - LMTCB if questions - rx efilled to [facility]. KIK Follow-up by: Duard Brady LPN,  November 01, 2010 5:39 PM  New/Updated Medications: PREDNISONE 5 MG TABS (PREDNISONE) once daily as directed Prescriptions: PREDNISONE 5 MG TABS (PREDNISONE) once daily as directed  #90 x 3   Entered by:   Duard Brady LPN   Authorized by:   Gordy Savers  MD   Signed by:   Duard Brady LPN on 16/09/9603   Method used:   [facility]        [facility]       [address]       [place], [place]  [address]       Ph: [phone]       Fax: [phone]   RxID:   [id number]

## 2011-01-29 NOTE — Progress Notes (Signed)
Summary: refill hyoscyamine  Phone Note Refill Request Message from:  Fax from Pharmacy on September 11, 2010 5:02 PM  Refills Requested: Medication #1:  HYOSCYAMINE SULFATE CR 0.375 MG XR12H-TAB one tablet by mouth once daily [employer]   Method Requested: Fax to [facility] Initial call taken by: Cay Schillings LPN,  September 12, 4579 5:02 PM    Prescriptions: HYOSCYAMINE SULFATE CR 0.375 MG XR12H-TAB (HYOSCYAMINE SULFATE) one tablet by mouth once daily  #90 x 3   Entered by:   Cay Schillings LPN   Authorized by:   Marletta Lor  MD   Signed by:   Cay Schillings LPN on 99/83/3825   Method used:   Historical   RxID:   [id number]  faxed to [facility] home delivery. KIK

## 2011-01-29 NOTE — Progress Notes (Signed)
Summary: refill aplrazolam - rx expired  Phone Note Refill Request Message from:  Fax from Pharmacy on September 21, 2010 8:16 AM  Refills Requested: Medication #1:  ALPRAZOLAM 0.5 MG TBDP Take 1/2 tablet by mouth once daily with adderall has unsued refill - expired rx  Network engineer   Method Requested: Fax to [facility] Initial call taken by: Cay Schillings LPN,  September 21, 6393 8:17 AM    Prescriptions: ALPRAZOLAM 0.5 MG TBDP (ALPRAZOLAM) Take 1/2 tablet by mouth once daily with adderall  #50 x 1   Entered by:   Cay Schillings LPN   Authorized by:   Marletta Lor  MD   Signed by:   Cay Schillings LPN on 32/00/3794   Method used:   Historical   RxID:   [id number]  faxed to [facility]

## 2011-01-29 NOTE — Progress Notes (Signed)
Summary: directions on proair inhaler  Phone Note From Pharmacy   Caller: [facility] Summary of Call: need directions on proair inhaler - for insurance aduit. KIk  Follow-up for Phone Call        change to med list and ne rx sent to [facility] Follow-up by: Cay Schillings LPN,  November 19, 1854 12:09 PM    New/Updated Medications: PROAIR HFA 108 (90 BASE) MCG/ACT AERS (ALBUTEROL SULFATE) 2 puffs qid prn Prescriptions: PROAIR HFA 108 (90 BASE) MCG/ACT AERS (ALBUTEROL SULFATE) 2 puffs qid prn  #1 x 6   Entered by:   Cay Schillings LPN   Authorized by:   Marletta Lor  MD   Signed by:   Cay Schillings LPN on 01/58/6825   Method used:   Faxed to ...       [facility]       [address]       [place], [place]  [address]       Ph: [phone]       Fax: [phone]   RxID:   [id number]

## 2011-01-29 NOTE — Progress Notes (Signed)
Summary: hyoscymine refill  Phone Note Refill Request Message from:  Fax from Pharmacy on May 29, 2010 5:36 PM  Refills Requested: Medication #1:  HYOSCYAMINE SULFATE CR 0.375 MG XR12H-TAB one tablet by mouth once daily [facility]   [phone]  opt #3    order # [id number]   Method Requested: Fax to [facility] Initial call taken by: Cay Schillings LPN,  May 30, 8675 7:20 PM    Prescriptions: HYOSCYAMINE SULFATE CR 0.375 MG XR12H-TAB (HYOSCYAMINE SULFATE) one tablet by mouth once daily  #90 x 4   Entered by:   Cay Schillings LPN   Authorized by:   Marletta Lor  MD   Signed by:   Cay Schillings LPN on 94/70/9628   Method used:   Historical   RxID:   [id number]  faxed to [place] [facility] delivery pharmacy  KIK

## 2011-01-29 NOTE — Progress Notes (Signed)
Summary: mail order meds  Phone Note Refill Request Message from:  Fax from Pharmacy on September 25, 2010 11:07 AM  Refills Requested: Medication #1:  ALPRAZOLAM 0.5 MG TBDP Take 1/2 tablet by mouth once daily with adderall  Medication #2:  AMBIEN 10 MG TABS Take 1 tablet by mouth at bedtime as needed [facility]   Method Requested: Fax to Local Pharmacy Initial call taken by: Duard Brady LPN,  September 25, 2010 11:12 AM    Prescriptions: AMBIEN 10 MG TABS (ZOLPIDEM TARTRATE) Take 1 tablet by mouth at bedtime as needed  #50 x 3   Entered by:   Duard Brady LPN   Authorized by:   Gordy Savers  MD   Signed by:   Duard Brady LPN on 16/09/9603   Method used:   Historical   RxID:   [id number] ALPRAZOLAM 0.5 MG TBDP (ALPRAZOLAM) Take 1/2 tablet by mouth once daily with adderall  #50 x 2   Entered by:   Duard Brady LPN   Authorized by:   Gordy Savers  MD   Signed by:   Duard Brady LPN on 21/30/8657   Method used:   Historical   RxID:   [id number]  faxed to [facility]   KIK

## 2011-01-29 NOTE — Assessment & Plan Note (Signed)
Summary: UC F-UP/YF    History of Present Illness Visit Type: Follow-up Visit Primary GI MD: Stan Head MD [facility] Primary Provider: Beverely Low, MD Requesting Provider: Marzella Schlein Chief Complaint: ulcerative colitis History of Present Illness:   Last seen 12/2008. He has  colonoscopy  8/09 minimal dz   Is costipated on narcotic analgesics used for post-op hand pain.  Bruising with prednisone. Had tried Lialda but is convinced he had atypical chest pain and dyspnea with that. Unwilling to try that now but perhaps in future. Carol Ada to recover from left hand surgery. He is contemplating repeat left shoulder surgery.     GI Review of Systems      Denies abdominal pain, acid reflux, belching, bloating, chest pain, dysphagia with liquids, dysphagia with solids, heartburn, loss of appetite, nausea, vomiting, vomiting blood, weight loss, and  weight gain.      Reports constipation.     Denies anal fissure, black tarry stools, change in bowel habit, diarrhea, diverticulosis, fecal incontinence, heme positive stool, hemorrhoids, irritable bowel syndrome, jaundice, light color stool, liver problems, rectal bleeding, and  rectal pain.    Current Medications (verified): 1)  Adderall Xr 10 Mg Cp24 (Amphetamine-Dextroamphetamine) .... Take 1 Tablet By Mouth Every Morning 2)  Fosamax 70 Mg Tabs (Alendronate Sodium) .... Take 1 Tablet By Mouth Once A Week 3)  Omeprazole 20 Mg Cpdr (Omeprazole) .Marland Kitchen.. 1 Once A Day 4)  Hyoscyamine Sulfate Cr 0.375 Mg Xr12h-Tab (Hyoscyamine Sulfate) .... One Tablet By Mouth Once Daily 5)  Alprazolam 0.5 Mg Tbdp (Alprazolam) .... Take 1/2 Tablet By Mouth Once Daily With Adderall 6)  Zocor 80 Mg Tabs (Simvastatin) .... Take 1 Tablet By Mouth At Bedtime 7)  Benazepril-Hydrochlorothiazide 20-12.5 Mg  Tabs (Benazepril-Hydrochlorothiazide) .... Take 1 Tablet By Mouth Once A Day 8)  Androgel 50 Mg/5gm  Gel (Testosterone) .Marland Kitchen.. 1 Once Daily 9)  Multivitamins    Tabs (Multiple Vitamin) .... Take 1 Tablet By Mouth Once A Day 10)  Allegra-D 12 Hour 60-120 Mg Xr12h-Tab (Fexofenadine-Pseudoephedrine) .... One Twice Daily As Needed 11)  Ambien 10 Mg Tabs (Zolpidem Tartrate) .... Take 1 Tablet By Mouth At Bedtime As Needed 12)  Prednisone 7. 5 Mg Tabs (Prednisone) .... Qd 13)  Budeprion Sr 150 Mg Xr12h-Tab (Bupropion Hcl) .... Take 1 Tablet By Mouth Once A Day in Morning 14)  Upcal D 500-250 Mg-Unit Pack (Calcium Citrate-Vitamin D) .Marland Kitchen.. 1 Two Times A Day 15)  Voltaren 1 % Gel (Diclofenac Sodium) .... As Needed 16)  Proair Hfa 108 (90 Base) Mcg/act Aers (Albuterol Sulfate) .... Use As Needed 17)  Methocarbamol 500 Mg Tabs (Methocarbamol) .... Take 1 Tablet Every 6 Hours For Spasms 18)  Oxycodone-Acetaminophen 5-325 Mg Tabs (Oxycodone-Acetaminophen) .... Take 1-2 Every 4-6 Hours For Pain 19)  Doc-Q-Lace 100 Mg Caps (Docusate Sodium) .... Two Times A Day 20)  Vitamin C 1000 Mg Tabs (Ascorbic Acid) .... Once Daily 21)  [facility]vs Vitamin B-6 200 Mg Tabs (Pyridoxine Hcl) .... Once Daily 22)  Benadryl 25 Mg Tabs (Diphenhydramine Hcl) .... As Needed 23)  Gentle Laxative 5 Mg Tbec (Bisacodyl) .... Take 2 Tablets Daily 24)  Gentle Laxative 10 Mg Supp (Bisacodyl) .... As Needed  Allergies (verified): 1)  ! Novocain 2)  ! Purinethol (Mercaptopurine) 3)  ! Adhesive Tape 4)  Aspirin (Aspirin)  Past History:  Past Medical History: Reviewed history from 10/26/2008 and no changes required. Asthma Hyperlipidemia Hypertension GERD Anxiety Depression Ulcerative colitis Renal insufficiency  Osteoporosis ADD  Past Surgical History:  Colonoscopy-07/2008 left elbow surgery in February 2003 left shoulder surgery, October 2008, injury at work, dislocated I&D perirectal abscess 1998 Lumbar disk 1992 bone density 2008 2010 Hand surgery 02/2010 basal thumb jont removal and tendon transplant Orlan Leavens)  Family History: Reviewed history from 11/17/2008 and no changes  required. father died at 51.  History: Colon Cancer, metastatic to the brain mother died at 70, heart failure S/P CABG AGE 45 two brothers, one sister:  one brother with coronary disease,AGE 22 seizure disorder, and dementia.  Sister with COPD  Social History: Reviewed history from 05/27/2008 and no changes required. Never Smoked Married Illicit Drug Use - no Alcohol Use - yes  glass of wine with dinner Patient does not get regular exercise.   Review of Systems       The patient complains of arthritis/joint pain, back pain, fatigue, itching, and sleeping problems.    Vital Signs:  Patient profile:   65 year old male Height:      66 inches Weight:      146.50 pounds BMI:     23.73 Pulse rate:   68 / minute Pulse rhythm:   regular BP sitting:   134 / 72  (left arm) Cuff size:   regular  Vitals Entered By: June McMurray CMA Duncan Dull) (April 03, 2010 10:02 AM)  Physical Exam  General:  mildly cushingoid NAD Eyes:  anicteric Lungs:  scattered wheeze Heart:  Regular rate and rhythm; no murmurs, rubs,  or bruits. Abdomen:  Bowel sounds positive,abdomen soft and non-tender without masses, organomegaly or hernias noted. Extremities:  no edema LUE in wrap/splint Psych:  Alert and cooperative. Normal mood and affect.   Impression & Recommendations:  Problem # 1:  UNIVERSAL ULCERATIVE COLITIS (ICD-556.6) Assessment Unchanged Review of old biopsy and colonoscopy reports to 1982 indicate some 'granulomatoid features' on original biopsies and overall minimally active colitis in most cases. Endoscopic features when active are diffuse and confluent most consistent with UC. His current colonoscopy did not show active endoscopic disease nor did it show any activity on biopsies. He requires propofol sedation. He is intolerant of immunomodulators and there was a ? of renal insufficeincy related to Asacol. recent 24 hr urine without proteinuria and creat stable x yrs. He tried Lialda but  thought it caused exertional chest pain and dyspnea  Will try to taper prednisone to 5 mg once daily over next few months and then screening colonoscopy later this year. ERV 4 months.  Problem # 2:  RENAL DISEASE, CHRONIC, MILD (ICD-585.2) Assessment: Deteriorated Creatinine up to 1.8, will recheck. He says it was a creatine supplement that caused it.  Orders: TLB-BMP (Basic Metabolic Panel-BMET) (80048-METABOL)  Problem # 3:  STEROID USE, LONG TERM (ICD-V58.65) Assessment: Unchanged He is well aware of side effects of long-term steroids from multiple prior discussions. Will reattempt taper.  Problem # 4:  IRRITABLE BOWEL SYNDROME (ICD-564.1) Assessment: Unchanged ok at present He clearly has this in top of IBD   Patient Instructions: 1)  Please go to the basement to have your lab tests drawn today. 2)  Your kidney function will be rechecked today. 3)  Change prednisone as written in your medication list below. 4)  Please schedule a follow-up appointment in 4 months.  5)  The medication list was reviewed and reconciled.  All changed / newly prescribed medications were explained.  A complete medication list was provided to the patient / caregiver. Prescriptions: PREDNISONE 5 MG  TABS (PREDNISONE) 1 1/2 tabs by mouth  one day alternating with 1 tab daily x 1 month then on May 5 change to 5 mg by mouth once daily (1 tab)  #45 x 3   Entered and Authorized by:   Iva Boop MD, [facility]   Signed by:   Iva Boop MD, [facility] on 04/03/2010   Method used:   Electronically to        [facility] (retail)       [address]       [place], [place]  [address]       Ph: [phone]       Fax: [phone]   RxID:   [id number]

## 2011-01-29 NOTE — Progress Notes (Signed)
Summary: refill  Phone Note Call from Patient   Caller: Patient Call For: Marletta Lor  MD Summary of Call: Pt needs refill of generic Pro Air called to [facility]. [phone] Initial call taken by: Deanna Artis CMA,  January 01, 2010 10:11 AM  Follow-up for Phone Call        Rx Called In Follow-up by: Chipper Oman, RN,  January 01, 2010 1:46 PM    New/Updated Medications: PROAIR HFA 108 (90 BASE) MCG/ACT AERS (ALBUTEROL SULFATE) use as needed Prescriptions: PROAIR HFA 108 (90 BASE) MCG/ACT AERS (ALBUTEROL SULFATE) use as needed  #1 x 5   Entered by:   Chipper Oman, RN   Authorized by:   Marletta Lor  MD   Signed by:   Chipper Oman, RN on 01/01/2010   Method used:   Electronically to        [facility]       [address]       [place], [place]  [address]       Ph: [phone]       Fax: [phone]   RxID:   [id number]

## 2011-01-29 NOTE — Assessment & Plan Note (Signed)
Summary: 3 month rov/njr  Will in the  Vital Signs:  Patient profile:   65 year old male Weight:      147 pounds Temp:     97.7 degrees F oral BP sitting:   112 / 68  (right arm) Cuff size:   regular  Vitals Entered By: Duard Brady LPN (March 20, 2010 8:20 AM) CC: 3 mos rov - faitgue this am , needs refills Is Patient Diabetic? No   Primary Care Provider:  Beverely Low, MD  CC:  3 mos rov - faitgue this am  and needs refills.  History of Present Illness: 65 year old patient who is seen today for follow-up.  He has a history of ulcerative colitis and is scheduled for GI follow-up soon.  He is tolerated a prednisone taper to 7.5 mg daily.  His hypertension and dyslipidemia.  Laboratory studies were reviewed from December of last year.  He has a history of asthma, which has been stable, although he does complain of some mild allergy related symptoms.  He has a history depression and does complain of some fatigue today.  A brother had serious traumatic brain injury about 7 weeks ago.  He has ADD, which has been stable.  Allergies: 1)  ! Novocain 2)  ! Purinethol (Mercaptopurine) 3)  ! Adhesive Tape 4)  Aspirin (Aspirin)  Past History:  Past Medical History: Reviewed history from 10/26/2008 and no changes required. Asthma Hyperlipidemia Hypertension GERD Anxiety Depression Ulcerative colitis Renal insufficiency  Osteoporosis ADD  Past Surgical History: Reviewed history from 12/20/2009 and no changes required. Colonoscopy-09/25/2006 left elbow surgery in February 2003 left shoulder surgery, October 2008, injury at work, dislocated I&D perirectal abscess 1998 Lumbar disk 1992 bone density 2008 2010  Review of Systems       The patient complains of depression.  The patient denies anorexia, fever, weight loss, weight gain, vision loss, decreased hearing, hoarseness, chest pain, syncope, dyspnea on exertion, peripheral edema, prolonged cough, headaches,  hemoptysis, abdominal pain, melena, hematochezia, severe indigestion/heartburn, hematuria, incontinence, genital sores, muscle weakness, suspicious skin lesions, transient blindness, difficulty walking, unusual weight change, abnormal bleeding, enlarged lymph nodes, angioedema, breast masses, and testicular masses.    Physical Exam  General:  Well-developed,well-nourished,in no acute distress; alert,appropriate and cooperative throughout examination; low-pressure low normal Head:  Normocephalic and atraumatic without obvious abnormalities. No apparent alopecia or balding. Eyes:  No corneal or conjunctival inflammation noted. EOMI. Perrla. Funduscopic exam benign, without hemorrhages, exudates or papilledema. Vision grossly normal. Mouth:  Oral mucosa and oropharynx without lesions or exudates.  Teeth in good repair. Neck:  No deformities, masses, or tenderness noted. Lungs:  Normal respiratory effort, chest expands symmetrically. Lungs are clear to auscultation, no crackles or wheezes. Heart:  grade 2/6 systolic murmur Abdomen:  Bowel sounds positive,abdomen soft and non-tender without masses, organomegaly or hernias noted. Msk:  No deformity or scoliosis noted of thoracic or lumbar spine.   Pulses:  R and L carotid,radial,femoral,dorsalis pedis and posterior tibial pulses are full and equal bilaterally Extremities:  No clubbing, cyanosis, edema, or deformity noted with normal full range of motion of all joints.   Skin:  resolving ecchymosis, right medial calf Cervical Nodes:  No lymphadenopathy noted   Impression & Recommendations:  Problem # 1:  OSTEOARTHRITIS (ICD-715.90)  Problem # 2:  OSTEOPOROSIS (ICD-733.00)  His updated medication list for this problem includes:    Fosamax 70 Mg Tabs (Alendronate sodium) .Marland Kitchen... Take 1 tablet by mouth once a week  His updated medication  list for this problem includes:    Fosamax 70 Mg Tabs (Alendronate sodium) .Marland Kitchen... Take 1 tablet by mouth once a  week  Problem # 3:  UNIVERSAL ULCERATIVE COLITIS (ICD-556.6)  Problem # 4:  ADD (ICD-314.00) medications refilled  Problem # 5:  HYPERTENSION (ICD-401.9)  His updated medication list for this problem includes:    Benazepril-hydrochlorothiazide 20-12.5 Mg Tabs (Benazepril-hydrochlorothiazide) .Marland Kitchen... Take 1 tablet by mouth once a day  His updated medication list for this problem includes:    Benazepril-hydrochlorothiazide 20-12.5 Mg Tabs (Benazepril-hydrochlorothiazide) .Marland Kitchen... Take 1 tablet by mouth once a day  Complete Medication List: 1)  Adderall Xr 10 Mg Cp24 (Amphetamine-dextroamphetamine) .... Take 1 tablet by mouth every morning 2)  Fosamax 70 Mg Tabs (Alendronate sodium) .... Take 1 tablet by mouth once a week 3)  Omeprazole 20 Mg Cpdr (Omeprazole) .Marland Kitchen.. 1 once a day 4)  Hyoscyamine Sulfate Cr 0.375 Mg Xr12h-tab (Hyoscyamine sulfate) .... One tablet by mouth once daily 5)  Alprazolam 0.5 Mg Tbdp (Alprazolam) .... Take 1/2 tablet by mouth once daily with adderall 6)  Zocor 80 Mg Tabs (Simvastatin) .... Take 1 tablet by mouth at bedtime 7)  Benazepril-hydrochlorothiazide 20-12.5 Mg Tabs (Benazepril-hydrochlorothiazide) .... Take 1 tablet by mouth once a day 8)  Androgel 50 Mg/5gm Gel (Testosterone) .Marland Kitchen.. 1 once daily 9)  Multivitamins Tabs (Multiple vitamin) .... Take 1 tablet by mouth once a day 10)  Allegra-d 12 Hour 60-120 Mg Xr12h-tab (Fexofenadine-pseudoephedrine) .... One twice daily as needed 11)  Ambien 10 Mg Tabs (Zolpidem tartrate) .... Take 1 tablet by mouth at bedtime as needed 12)  Prednisone 7. 5 Mg Tabs (prednisone)  .... Qd 13)  Budeprion Sr 150 Mg Xr12h-tab (Bupropion hcl) .... Take 1 tablet by mouth once a day in morning 14)  Upcal D 500-250 Mg-unit Pack (Calcium citrate-vitamin d) .Marland Kitchen.. 1 two times a day 15)  Voltaren 1 % Gel (Diclofenac sodium) .... As needed 16)  Proair Hfa 108 (90 Base) Mcg/act Aers (Albuterol sulfate) .... Use as needed  Patient  Instructions: 1)  Please schedule a follow-up appointment in 4 months. 2)  Limit your Sodium (Salt). 3)  It is important that you exercise regularly at least 20 minutes 5 times a week. If you develop chest pain, have severe difficulty breathing, or feel very tired , stop exercising immediately and seek medical attention. Prescriptions: ADDERALL XR 10 MG CP24 (AMPHETAMINE-DEXTROAMPHETAMINE) Take 1 tablet by mouth every morning  #30 x 0   Entered and Authorized by:   Gordy Savers  MD   Signed by:   Gordy Savers  MD on 03/20/2010   Method used:   Print then Give to Patient   RxID:   [id number] ADDERALL XR 10 MG CP24 (AMPHETAMINE-DEXTROAMPHETAMINE) Take 1 tablet by mouth every morning  #30 x 0   Entered and Authorized by:   Gordy Savers  MD   Signed by:   Gordy Savers  MD on 03/20/2010   Method used:   Print then Give to Patient   RxID:   [id number] PROAIR HFA 108 (90 BASE) MCG/ACT AERS (ALBUTEROL SULFATE) use as needed  #1 x 5   Entered and Authorized by:   Gordy Savers  MD   Signed by:   Gordy Savers  MD on 03/20/2010   Method used:   Print then Give to Patient   RxID:   [id number] BUDEPRION SR 150 MG XR12H-TAB (BUPROPION HCL) Take 1 tablet by mouth once a day  in morning  #90 x 6   Entered and Authorized by:   Gordy Savers  MD   Signed by:   Gordy Savers  MD on 03/20/2010   Method used:   Print then Give to Patient   RxID:   [id number] PREDNISONE 7. 5 MG TABS (PREDNISONE) qd  #90 x 2   Entered and Authorized by:   Gordy Savers  MD   Signed by:   Gordy Savers  MD on 03/20/2010   Method used:   Print then Give to Patient   RxID:   [id number] AMBIEN 10 MG TABS (ZOLPIDEM TARTRATE) Take 1 tablet by mouth at bedtime as needed  #50 x 3   Entered and Authorized by:   Gordy Savers  MD   Signed by:   Gordy Savers  MD on 03/20/2010   Method used:   Print then Give to Patient   RxID:    [id number] ALLEGRA-D 12 HOUR 60-120 MG XR12H-TAB (FEXOFENADINE-PSEUDOEPHEDRINE) one twice daily as needed  #50 x 6   Entered and Authorized by:   Gordy Savers  MD   Signed by:   Gordy Savers  MD on 03/20/2010   Method used:   Print then Give to Patient   RxID:   [id number] ANDROGEL 50 MG/5GM  GEL (TESTOSTERONE) 1 once daily  #30 x 6   Entered and Authorized by:   Gordy Savers  MD   Signed by:   Gordy Savers  MD on 03/20/2010   Method used:   Print then Give to Patient   RxID:   [id number] BENAZEPRIL-HYDROCHLOROTHIAZIDE 20-12.5 MG  TABS (BENAZEPRIL-HYDROCHLOROTHIAZIDE) Take 1 tablet by mouth once a day  #90 x 6   Entered and Authorized by:   Gordy Savers  MD   Signed by:   Gordy Savers  MD on 03/20/2010   Method used:   Print then Give to Patient   RxID:   [id number] ZOCOR 80 MG TABS (SIMVASTATIN) Take 1 tablet by mouth at bedtime  #90 x 6   Entered and Authorized by:   Gordy Savers  MD   Signed by:   Gordy Savers  MD on 03/20/2010   Method used:   Print then Give to Patient   RxID:   [id number] ALPRAZOLAM 0.5 MG TBDP (ALPRAZOLAM) Take 1/2 tablet by mouth once daily with adderall  #50 x 6   Entered and Authorized by:   Gordy Savers  MD   Signed by:   Gordy Savers  MD on 03/20/2010   Method used:   Print then Give to Patient   RxID:   [id number] OMEPRAZOLE 20 MG CPDR (OMEPRAZOLE) 1 once a day  #90 x 6   Entered and Authorized by:   Gordy Savers  MD   Signed by:   Gordy Savers  MD on 03/20/2010   Method used:   Print then Give to Patient   RxID:   [id number] FOSAMAX 70 MG TABS (ALENDRONATE SODIUM) Take 1 tablet by mouth once a week  #3 x 6   Entered and Authorized by:   Gordy Savers  MD   Signed by:   Gordy Savers  MD on 03/20/2010   Method used:   Print then Give to Patient   RxID:   [id number] ADDERALL XR 10 MG CP24  (AMPHETAMINE-DEXTROAMPHETAMINE) Take 1 tablet by mouth every morning  #30 x 0   Entered  and Authorized by:   Gordy Savers  MD   Signed by:   Gordy Savers  MD on 03/20/2010   Method used:   Print then Give to Patient   RxID:   [id number]

## 2011-01-29 NOTE — Assessment & Plan Note (Signed)
Summary: 4 month rov/njr   Vital Signs:  Patient profile:   65 year old male Weight:      148 pounds Temp:     98.1 degrees F oral BP sitting:   102 / 70  (left arm) Cuff size:   regular  Vitals Entered By: Duard Brady LPN (July 17, 2010 8:00 AM) CC: 4 mos rov - doing ok Is Patient Diabetic? No   Primary Care Provider:  Beverely Low, MD  CC:  4 mos rov - doing ok.  History of Present Illness: T13-year[age]old patient who is seen today for follow-up medical problems include ulcerative colitis.  He is followed closely by GI and the prednisone dose recently tapered to 5 mg.  He has associated osteoporosis.  He has ADD, which has been stable.  History of hypertension, and dyslipidemia.  He is recovering from a recent left hand surgery.  Has a history of anxiety and insomnia, which has improved  Allergies: 1)  ! Novocain 2)  ! Purinethol (Mercaptopurine) 3)  ! Adhesive Tape 4)  Aspirin (Aspirin)  Past History:  Past Medical History: Reviewed history from 10/26/2008 and no changes required. 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    Physical Exam  General:  Well-developed,well-nourished,in no acute distress; alert,appropriate and cooperative throughout examination Head:  Normocephalic and atraumatic without obvious abnormalities. No apparent alopecia or balding. Eyes:  No corneal or conjunctival inflammation noted. EOMI. Perrla. Funduscopic exam benign, without hemorrhages, exudates or papilledema. Vision grossly normal. Ears:  External ear exam shows no significant lesions or deformities.  Otoscopic examination reveals clear canals, tympanic membranes are intact bilaterally without bulging, retraction, inflammation or discharge. Hearing is grossly normal bilaterally. Mouth:  Oral mucosa and oropharynx without lesions or exudates.  Teeth in good repair. Neck:  No deformities, masses, or tenderness noted. Chest Wall:  No deformities, masses, tenderness or gynecomastia noted. Lungs:  Normal respiratory effort, chest expands symmetrically. Lungs are clear to auscultation, no crackles or wheezes. Heart:  Normal rate and regular rhythm. S1 and S2 normal without gallop, murmur, click, rub or other extra sounds. Abdomen:  Bowel sounds positive,abdomen soft and non-tender without masses, organomegaly or hernias noted. Msk:  No deformity or scoliosis noted of thoracic or lumbar spine.   Pulses:  R and L carotid,radial,femoral,dorsalis pedis and posterior tibial pulses are full and equal bilaterally Extremities:  No clubbing, cyanosis, edema, or deformity noted with normal full range of motion of all joints.     Impression & Recommendations:  Problem # 1:  OSTEOPOROSIS (ICD-733.00)  His updated medication list for this problem includes:    Fosamax 70 Mg Tabs (Alendronate sodium) .Marland Kitchen... Take 1 tablet by mouth once a week  His updated medication list for this problem includes:    Fosamax 70 Mg Tabs (Alendronate sodium) .Marland Kitchen... Take 1 tablet by  mouth once a week  Problem #  2:  UNIVERSAL ULCERATIVE COLITIS (ICD-556.6)  Problem # 3:  ADD (ICD-314.00)  Problem # 4:  HYPERTENSION (ICD-401.9)  His updated medication list for this problem includes:    Benazepril-hydrochlorothiazide 20-12.5 Mg Tabs (Benazepril-hydrochlorothiazide) .Marland Kitchen... Take 1 tablet by mouth once a day  His updated medication list for this problem includes:    Benazepril-hydrochlorothiazide 20-12.5 Mg Tabs (Benazepril-hydrochlorothiazide) .Marland Kitchen... Take 1 tablet by mouth once a day  Complete Medication List: 1)  Adderall Xr 10 Mg Cp24 (Amphetamine-dextroamphetamine) .... Take 1 tablet by mouth every morning 2)  Fosamax 70 Mg Tabs (Alendronate sodium) .... Take 1 tablet by mouth once a week 3)  Omeprazole 20 Mg Cpdr (Omeprazole) .Marland Kitchen.. 1 once a day 4)  Hyoscyamine Sulfate Cr 0.375 Mg Xr12h-tab (Hyoscyamine sulfate) .... One tablet by mouth once daily 5)  Alprazolam 0.5 Mg Tbdp (Alprazolam) .... Take 1/2 tablet by mouth once daily with adderall 6)  Zocor 80 Mg Tabs (Simvastatin) .... Take 1 tablet by mouth at bedtime 7)  Benazepril-hydrochlorothiazide 20-12.5 Mg Tabs (Benazepril-hydrochlorothiazide) .... Take 1 tablet by mouth once a day 8)  Androgel 50 Mg/5gm Gel (Testosterone) .Marland Kitchen.. 1 once daily 9)  Multivitamins Tabs (Multiple vitamin) .... Take 1 tablet by mouth once a day 10)  Allegra-d 12 Hour 60-120 Mg Xr12h-tab (Fexofenadine-pseudoephedrine) .... One twice daily as needed 11)  Ambien 10 Mg Tabs (Zolpidem tartrate) .... Take 1 tablet by mouth at bedtime as needed 12)  Prednisone 5 Mg Tabs (Prednisone) .Marland Kitchen.. 1 1/2 tabs by mouth one day alternating with 1 tab daily x 1 month then on may 5 change to 5 mg by mouth once daily (1 tab) 13)  Budeprion Sr 150 Mg Xr12h-tab (Bupropion hcl) .... Take 1 tablet by mouth once a day in morning 14)  Upcal D 500-250 Mg-unit Pack (Calcium citrate-vitamin d) .Marland Kitchen.. 1 two times a day 15)  Voltaren 1 % Gel (Diclofenac sodium) .... As needed 16)  Proair Hfa 108 (90 Base)  Mcg/act Aers (Albuterol sulfate) .... Use as needed 17)  Vitamin C 1000 Mg Tabs (Ascorbic acid) .... Once daily 18)  Cvs Vitamin B-6 200 Mg Tabs (Pyridoxine hcl) .... Once daily 19)  Benadryl 25 Mg Tabs (Diphenhydramine hcl) .... As needed 20)  Gentle Laxative 5 Mg Tbec (Bisacodyl) .... Take 2 tablets daily  Patient Instructions: 1)  Please schedule a follow-up appointment in 4 months. 2)  Limit your Sodium (Salt) to less than 4 grams a day (slightly less than 1 teaspoon) to prevent fluid retention, swelling, or worsening or symptoms. 3)  It is important that you exercise regularly at least 20 minutes 5 times a week. If you develop chest pain, have severe difficulty breathing, or feel very tired , stop exercising immediately and seek medical attention. 4)  Take calcium +Vitamin D daily. Prescriptions: ALPRAZOLAM 0.5 MG TBDP (ALPRAZOLAM) Take 1/2 tablet by mouth once daily with adderall  #50 x 6   Entered and Authorized by:   Gordy Savers  MD   Signed by:   Gordy Savers  MD on 07/17/2010   Method used:   Print then Give to Patient   RxID:   [id number] ADDERALL XR 10 MG CP24 (AMPHETAMINE-DEXTROAMPHETAMINE) Take 1 tablet by mouth every morning  #30 x 0   Entered and Authorized by:   Gordy Savers  MD   Signed by:   Gordy Savers  MD on 07/17/2010   Method used:   Print then  Give to Patient   RxID:   [id number] ADDERALL XR 10 MG CP24 (AMPHETAMINE-DEXTROAMPHETAMINE) Take 1 tablet by mouth every morning  #30 x 0   Entered and Authorized by:   Gordy Savers  MD   Signed by:   Gordy Savers  MD on 07/17/2010   Method used:   Print then Give to Patient   RxID:   [id number] ADDERALL XR 10 MG CP24 (AMPHETAMINE-DEXTROAMPHETAMINE) Take 1 tablet by mouth every morning  #30 x 0   Entered and Authorized by:   Gordy Savers  MD   Signed by:   Gordy Savers  MD on 07/17/2010   Method used:   Print then Give to Patient   RxID:    [id number]

## 2011-01-29 NOTE — Progress Notes (Signed)
Summary: REQ FOR MED  Phone Note Call from Patient   Caller: Patient [phone] Reason for Call: Refill Medication Summary of Call: Pt called to adv that he needs a new RX for med:  ADDERALL XR 10 MG ...... Pt adv that he was given 3 scripts at his last visit and 2 have already been filled but when he went to get the 3rd prescription filled, the pharmacist adv that she could not fill it because the script wasn't signed.... Pt would like to see if same can be prepared so he can take same to [facility] [address], [employer]  to be filled.  Pt also req refills RX for the next 3 months be prepared for p/u so he won't have to get off of work to make another trip for new scripts...Marland Kitchen??  Pt can be reached at [phone] when same is ready for p/u or if you have any questions or concerns.  Initial call taken by: Debbra Riding,  February 21, 2010 9:43 AM  Follow-up for Phone Call        spoke with pt - rx's ready for pick up .  KIK Follow-up by: Duard Brady LPN,  February 21, 2010 12:07 PM    Prescriptions: ADDERALL XR 10 MG CP24 (AMPHETAMINE-DEXTROAMPHETAMINE) Take 1 tablet by mouth every morning  #30 x 0   Entered and Authorized by:   Gordy Savers  MD   Signed by:   Gordy Savers  MD on 02/21/2010   Method used:   Print then Give to Patient   RxID:   [id number]

## 2011-01-29 NOTE — Progress Notes (Signed)
Summary: Schedule REV   Phone Note Outgoing Call Call back at [facility] Phone [phone]   Call placed by: Harlow Mares CMA Duncan Dull),  January 11, 2010 4:21 PM Call placed to: Patient Summary of Call: Left message on patients machine to call back. patient needs an office visit to schedule a rev to follow up on the patients UC Initial call taken by: Harlow Mares CMA Duncan Dull),  January 11, 2010 4:22 PM  Follow-up for Phone Call        Left message on patients machine to call back. Follow-up by: Harlow Mares CMA Duncan Dull),  January 19, 2010 11:55 AM  Additional Follow-up for Phone Call Additional follow up Details #1::        I scheduled pt for 02-09-10 4pm. He wanted the very latest appoinment of the day. Additional Follow-up by: Leanor Kail [facility],  January 19, 2010 3:41 PM

## 2011-01-31 NOTE — Medication Information (Signed)
Summary: New Rx for Androgel requested by Marikay Alar Rx for Androgel requested by [employer]   Imported By: Laural Benes 01/01/2011 09:40:28  _____________________________________________________________________  External Attachment:    Type:   Image     Comment:   External Document

## 2011-02-01 ENCOUNTER — Ambulatory Visit (INDEPENDENT_AMBULATORY_CARE_PROVIDER_SITE_OTHER): Payer: Managed Care, Other (non HMO) | Admitting: Internal Medicine

## 2011-02-01 ENCOUNTER — Encounter: Payer: Self-pay | Admitting: Internal Medicine

## 2011-02-01 DIAGNOSIS — F988 Other specified behavioral and emotional disorders with onset usually occurring in childhood and adolescence: Secondary | ICD-10-CM

## 2011-02-01 DIAGNOSIS — M25559 Pain in unspecified hip: Secondary | ICD-10-CM

## 2011-02-01 DIAGNOSIS — K5100 Ulcerative (chronic) pancolitis without complications: Secondary | ICD-10-CM

## 2011-02-01 DIAGNOSIS — I10 Essential (primary) hypertension: Secondary | ICD-10-CM

## 2011-02-01 MED ORDER — AMPHETAMINE-DEXTROAMPHET ER 10 MG PO CP24: 10.0000 mg | ORAL_CAPSULE | ORAL | Status: DC

## 2011-02-01 MED ORDER — PREDNISONE 5 MG PO TABS: 5.0000 mg | ORAL_TABLET | Freq: Every day | ORAL | Status: DC

## 2011-02-01 NOTE — Progress Notes (Signed)
  Subjective:    Patient ID: Joseph Mejia, male    DOB: 1946-03-16, 65 y.o.   MRN: [id number]  HPI  65 year old patient who presents with a 4 week history of worsening right hip and leg pain.  He does have a history of lumbar disk disease and 2005 and 2006 Underwent successful epidural injections at L4-5 on the left.  At that time, a lumbar MRI suggested a recurrent herniated disk on the right at the same level. He has a history of inflammatory bowel disease and has been on chronic prednisone.  He describes increasing pain, especially the small joints of the hands since a prednisone reduction down to 3.5 mg daily.  He has had some intermittent right hip pain in the past.  He does have a history of osteopenia, but has been reluctant to consider Fosamax due to possible side effects.  He has treated hypertension and ADD.  He needs refills on his Adderall. Review of Systems  Constitutional: Negative for fever, chills, appetite change and fatigue.  HENT: Negative for hearing loss, ear pain, congestion, sore throat, trouble swallowing, neck stiffness, dental problem, voice change and tinnitus.   Eyes: Negative for pain, discharge and visual disturbance.  Respiratory: Negative for cough, chest tightness, wheezing and stridor.   Cardiovascular: Negative for chest pain, palpitations and leg swelling.  Gastrointestinal: Negative for nausea, vomiting, abdominal pain, diarrhea, constipation, blood in stool and abdominal distention.  Genitourinary: Negative for urgency, hematuria, flank pain, discharge, difficulty urinating and genital sores.  Musculoskeletal: Positive for back pain and gait problem. Negative for myalgias, joint swelling and arthralgias.  Skin: Negative for rash.  Neurological: Negative for dizziness, syncope, speech difficulty, weakness, numbness and headaches.  Hematological: Negative for adenopathy. Does not bruise/bleed easily.  Psychiatric/Behavioral: Negative for behavioral problems  and dysphoric mood. The patient is not nervous/anxious.        Objective:   Physical Exam  Constitutional: He is oriented to person, place, and time. He appears well-developed.  HENT:  Head: Normocephalic.  Right Ear: External ear normal.  Left Ear: External ear normal.  Eyes: Conjunctivae and EOM are normal.  Neck: Normal range of motion.  Cardiovascular: Normal rate and normal heart sounds.   Pulmonary/Chest: Breath sounds normal.  Abdominal: Bowel sounds are normal.  Musculoskeletal: Normal range of motion. He exhibits no edema and no tenderness.       Patient had a positive straight leg test on the right; range of motion of the right hip appear to be intact without pain  Neurological: He is alert and oriented to person, place, and time.  Psychiatric: He has a normal mood and affect. His behavior is normal.          Assessment & Plan:

## 2011-02-01 NOTE — Assessment & Plan Note (Signed)
Appears clinically stable on reduce prednisone dose.  Will increase prednisone to 5 mg daily untold epidurals set up early next week

## 2011-02-01 NOTE — Assessment & Plan Note (Signed)
ADD-stable.  Ninety day supply of Adderall, dispensed today

## 2011-02-01 NOTE — Assessment & Plan Note (Signed)
Right hip pain-  Clinical gland appears consistent with a right herniated disk and he does have a prior MRI confirming a herniated disk at the right L4-5 level.  Will set up for epidural

## 2011-02-01 NOTE — Patient Instructions (Signed)
Return in the spring for your annual exam as scheduled Lumbar epidurals as scheduled  Call if any clinical worsening or leg weakness

## 2011-02-05 ENCOUNTER — Other Ambulatory Visit: Payer: Self-pay | Admitting: Internal Medicine

## 2011-02-05 ENCOUNTER — Telehealth: Payer: Self-pay | Admitting: Internal Medicine

## 2011-02-05 DIAGNOSIS — M25559 Pain in unspecified hip: Secondary | ICD-10-CM

## 2011-02-05 DIAGNOSIS — M2550 Pain in unspecified joint: Secondary | ICD-10-CM

## 2011-02-06 ENCOUNTER — Other Ambulatory Visit: Payer: Self-pay | Admitting: Internal Medicine

## 2011-02-06 ENCOUNTER — Ambulatory Visit
Admission: RE | Admit: 2011-02-06 | Discharge: 2011-02-06 | Disposition: A | Discharge status: Home / Self Care | Payer: Managed Care, Other (non HMO) | Source: Ambulatory Visit | Attending: Internal Medicine | Admitting: Internal Medicine

## 2011-02-06 DIAGNOSIS — M2550 Pain in unspecified joint: Secondary | ICD-10-CM

## 2011-02-06 DIAGNOSIS — M25559 Pain in unspecified hip: Secondary | ICD-10-CM

## 2011-02-13 NOTE — Telephone Encounter (Signed)
error 

## 2011-02-15 ENCOUNTER — Telehealth: Payer: Self-pay | Admitting: Internal Medicine

## 2011-02-15 ENCOUNTER — Other Ambulatory Visit: Payer: Self-pay | Admitting: Internal Medicine

## 2011-02-15 NOTE — Telephone Encounter (Signed)
efilled med 245pm  KIK

## 2011-02-15 NOTE — Telephone Encounter (Signed)
Pt called to adv that he needs a refill on med: wellbutrin 150 mg...... Joseph Mejia - [employer].... Pt is currently out of medication and will need same asap.

## 2011-03-08 ENCOUNTER — Other Ambulatory Visit: Payer: Managed Care, Other (non HMO) | Admitting: Internal Medicine

## 2011-03-08 DIAGNOSIS — Z0000 Encounter for general adult medical examination without abnormal findings: Secondary | ICD-10-CM

## 2011-03-08 LAB — BASIC METABOLIC PANEL
BUN: 28 mg/dL — ABNORMAL HIGH (ref 6–23)
CO2: 31 mEq/L (ref 19–32)
Chloride: 103 mEq/L (ref 96–112)
Glucose, Bld: 83 mg/dL (ref 70–99)
Potassium: 4.4 mEq/L (ref 3.5–5.1)
Sodium: 142 mEq/L (ref 135–145)

## 2011-03-08 LAB — CBC WITH DIFFERENTIAL/PLATELET
Basophils Absolute: 0 10*3/uL (ref 0.0–0.1)
HCT: 42.2 % (ref 39.0–52.0)
Hemoglobin: 14.4 g/dL (ref 13.0–17.0)
Lymphs Abs: 2.6 10*3/uL (ref 0.7–4.0)
MCHC: 34 g/dL (ref 30.0–36.0)
MCV: 94.9 fl (ref 78.0–100.0)
Monocytes Absolute: 0.8 10*3/uL (ref 0.1–1.0)
Monocytes Relative: 11.4 % (ref 3.0–12.0)
Neutro Abs: 3.2 10*3/uL (ref 1.4–7.7)
Platelets: 177 10*3/uL (ref 150.0–400.0)
RDW: 13.2 % (ref 11.5–14.6)

## 2011-03-08 LAB — LIPID PANEL
Cholesterol: 184 mg/dL (ref 0–200)
Triglycerides: 91 mg/dL (ref 0.0–149.0)
VLDL: 18.2 mg/dL (ref 0.0–40.0)

## 2011-03-08 LAB — POCT URINALYSIS DIPSTICK
Blood, UA: NEGATIVE
Glucose, UA: NEGATIVE
Ketones, UA: NEGATIVE
Protein, UA: NEGATIVE
Spec Grav, UA: 1.01

## 2011-03-08 LAB — HEPATIC FUNCTION PANEL
AST: 24 U/L (ref 0–37)
Albumin: 3.9 g/dL (ref 3.5–5.2)
Total Bilirubin: 0.5 mg/dL (ref 0.3–1.2)

## 2011-03-08 LAB — TSH: TSH: 1.25 u[IU]/mL (ref 0.35–5.50)

## 2011-03-08 LAB — PSA: PSA: 0.79 ng/mL (ref 0.10–4.00)

## 2011-03-15 ENCOUNTER — Encounter: Payer: Self-pay | Admitting: Internal Medicine

## 2011-03-18 ENCOUNTER — Other Ambulatory Visit: Payer: Self-pay | Admitting: Internal Medicine

## 2011-03-29 ENCOUNTER — Encounter: Payer: Self-pay | Admitting: Internal Medicine

## 2011-03-29 ENCOUNTER — Other Ambulatory Visit: Payer: Self-pay

## 2011-03-29 ENCOUNTER — Ambulatory Visit (INDEPENDENT_AMBULATORY_CARE_PROVIDER_SITE_OTHER): Payer: Managed Care, Other (non HMO) | Admitting: Internal Medicine

## 2011-03-29 VITALS — BP 110/78 | HR 86 | Temp 98.4°F | Resp 16 | Ht 67.0 in | Wt 140.0 lb

## 2011-03-29 DIAGNOSIS — M899 Disorder of bone, unspecified: Secondary | ICD-10-CM

## 2011-03-29 DIAGNOSIS — IMO0002 Reserved for concepts with insufficient information to code with codable children: Secondary | ICD-10-CM

## 2011-03-29 DIAGNOSIS — I10 Essential (primary) hypertension: Secondary | ICD-10-CM

## 2011-03-29 DIAGNOSIS — K5100 Ulcerative (chronic) pancolitis without complications: Secondary | ICD-10-CM

## 2011-03-29 DIAGNOSIS — M5416 Radiculopathy, lumbar region: Secondary | ICD-10-CM

## 2011-03-29 DIAGNOSIS — N182 Chronic kidney disease, stage 2 (mild): Secondary | ICD-10-CM

## 2011-03-29 DIAGNOSIS — M949 Disorder of cartilage, unspecified: Secondary | ICD-10-CM

## 2011-03-29 DIAGNOSIS — M1990 Unspecified osteoarthritis, unspecified site: Secondary | ICD-10-CM

## 2011-03-29 DIAGNOSIS — Z0000 Encounter for general adult medical examination without abnormal findings: Secondary | ICD-10-CM

## 2011-03-29 DIAGNOSIS — F988 Other specified behavioral and emotional disorders with onset usually occurring in childhood and adolescence: Secondary | ICD-10-CM

## 2011-03-29 MED ORDER — ALENDRONATE SODIUM 70 MG PO TABS: 70.0000 mg | ORAL_TABLET | ORAL | Status: DC

## 2011-03-29 MED ORDER — OMEPRAZOLE 20 MG PO CPDR: 20.0000 mg | DELAYED_RELEASE_CAPSULE | Freq: Every day | ORAL | Status: DC

## 2011-03-29 MED ORDER — ALPRAZOLAM 0.5 MG PO TABS: 0.2500 mg | ORAL_TABLET | Freq: Every day | ORAL | Status: DC

## 2011-03-29 MED ORDER — SIMVASTATIN 80 MG PO TABS: 80.0000 mg | ORAL_TABLET | Freq: Every day | ORAL | Status: DC

## 2011-03-29 MED ORDER — ALBUTEROL SULFATE HFA 108 (90 BASE) MCG/ACT IN AERS: 2.0000 | INHALATION_SPRAY | Freq: Four times a day (QID) | RESPIRATORY_TRACT | Status: DC

## 2011-03-29 MED ORDER — PREDNISONE 2.5 MG PO TABS: ORAL_TABLET | ORAL | Status: DC

## 2011-03-29 MED ORDER — BENAZEPRIL-HYDROCHLOROTHIAZIDE 20-12.5 MG PO TABS: 1.0000 | ORAL_TABLET | Freq: Every day | ORAL | Status: DC

## 2011-03-29 MED ORDER — ZOLPIDEM TARTRATE 10 MG PO TABS: 10.0000 mg | ORAL_TABLET | Freq: Every evening | ORAL | Status: DC | PRN

## 2011-03-29 MED ORDER — AMPHETAMINE-DEXTROAMPHET ER 10 MG PO CP24: 10.0000 mg | ORAL_CAPSULE | ORAL | Status: DC

## 2011-03-29 MED ORDER — TESTOSTERONE 50 MG/5GM (1%) TD GEL: 5.0000 g | Freq: Every day | TRANSDERMAL | Status: DC

## 2011-03-29 MED ORDER — BUPROPION HCL ER (SR) 150 MG PO TB12: 150.0000 mg | ORAL_TABLET | ORAL | Status: DC

## 2011-03-29 NOTE — Progress Notes (Signed)
Subjective:    Patient ID: Joseph Mejia, male    DOB: 01-11-1946, 65 y.o.   MRN: [id number]  HPI  65 year old patient who is seen today for an annual exam. Medical problems include a history of ulcerative colitis. This has been stable for some time and presently he is on a prednisone taper presently he has tapered down to 2.5 mg daily. He's had no GI symptoms. He has a history of ADD and which has done well on his present regimen refills are needed. He continues to have right lumbar back pain with radiation down the right leg. He did have an epidural earlier this year which was not very successful. A lumbar MRI in 2005 revealed a right L4-L5 disc. He states the pain has worsening and he is requesting another MRI for further evaluation and consideration of surgery  Review of Systems  Constitutional: Negative for fever, chills, activity change, appetite change and fatigue.  HENT: Negative for hearing loss, ear pain, congestion, rhinorrhea, sneezing, mouth sores, trouble swallowing, neck pain, neck stiffness, dental problem, voice change, sinus pressure and tinnitus.   Eyes: Negative for photophobia, pain, redness and visual disturbance.  Respiratory: Negative for apnea, cough, choking, chest tightness, shortness of breath and wheezing.   Cardiovascular: Negative for chest pain, palpitations and leg swelling.  Gastrointestinal: Negative for nausea, vomiting, abdominal pain, diarrhea, constipation, blood in stool, abdominal distention, anal bleeding and rectal pain.  Genitourinary: Negative for dysuria, urgency, frequency, hematuria, flank pain, decreased urine volume, discharge, penile swelling, scrotal swelling, difficulty urinating, genital sores and testicular pain.  Musculoskeletal: Positive for back pain. Negative for myalgias, joint swelling, arthralgias and gait problem.  Skin: Negative for color change, rash and wound.       [Easy bruisability secondary to prednisone Neurological:  Negative for dizziness, tremors, seizures, syncope, facial asymmetry, speech difficulty, weakness, light-headedness, numbness and headaches.  Hematological: Negative for adenopathy. Does not bruise/bleed easily.  Psychiatric/Behavioral: Negative for suicidal ideas, hallucinations, behavioral problems, confusion, sleep disturbance, self-injury, dysphoric mood, decreased concentration and agitation. The patient is not nervous/anxious.        Objective:   Physical Exam  Constitutional: He appears well-developed and well-nourished.       Blood pressure low normal  HENT:  Head: Normocephalic and atraumatic.  Right Ear: External ear normal.  Left Ear: External ear normal.  Nose: Nose normal.  Mouth/Throat: Oropharynx is clear and moist.  Eyes: Conjunctivae and EOM are normal. Pupils are equal, round, and reactive to light. No scleral icterus.  Neck: Normal range of motion. Neck supple. No JVD present. No thyromegaly present.  Cardiovascular: Regular rhythm, normal heart sounds and intact distal pulses.  Exam reveals no gallop and no friction rub.   No murmur heard. Pulmonary/Chest: Effort normal and breath sounds normal. He exhibits no tenderness.  Abdominal: Soft. Bowel sounds are normal. He exhibits no distension and no mass. There is no tenderness.  Genitourinary: Prostate normal and penis normal.       Prostate +2 enlarged and benign. Stool hematest negative.  Musculoskeletal: Normal range of motion. He exhibits no edema and no tenderness.  Lymphadenopathy:    He has no cervical adenopathy.  Neurological: He is alert. He has normal reflexes. No cranial nerve deficit. Coordination normal.  Skin: Skin is warm and dry. Rash noted.       Scattered ecchymoses over the arms  Psychiatric: He has a normal mood and affect. His behavior is normal.  Assessment & Plan:  Annual health examination Chronic lumbar and right leg pain. History of right L4-L5 herniated disc Hypertension  stable Universal ulcerative colitis ADD Osteopenia. We'll  continue calcium vitamin D and Fosamax. Chronic lumbar and right leg pain refractory to spinal epidurals. We'll set up for a lumbar MRI

## 2011-03-29 NOTE — Patient Instructions (Signed)
It is important that you exercise regularly, at least 20 minutes 3 to 4 times per week.  If you develop chest pain or shortness of breath seek  medical attention.  Take a calcium supplement, plus [phone] units of vitamin D  Return in 4 months for follow-up

## 2011-03-29 NOTE — Telephone Encounter (Signed)
Opened in error

## 2011-04-11 ENCOUNTER — Ambulatory Visit
Admission: RE | Admit: 2011-04-11 | Discharge: 2011-04-11 | Disposition: A | Discharge status: Home / Self Care | Payer: Managed Care, Other (non HMO) | Source: Ambulatory Visit | Attending: Internal Medicine | Admitting: Internal Medicine

## 2011-04-11 DIAGNOSIS — M5416 Radiculopathy, lumbar region: Secondary | ICD-10-CM

## 2011-04-24 ENCOUNTER — Telehealth: Payer: Self-pay | Admitting: *Deleted

## 2011-04-24 NOTE — Telephone Encounter (Signed)
Pt would like MRI results, please.

## 2011-04-25 NOTE — Telephone Encounter (Signed)
Attempt to call - ans mach at Hm# - [id number] if need assist with referral to neurosurgeon. Left results. KIK

## 2011-04-25 NOTE — Telephone Encounter (Signed)
Please patient that the lumbar MRI revealed significant degenerative changes as well as moderate spinal stenosis. Suggest he followup with his neurosurgeon. Ask if we need to refer

## 2011-05-14 NOTE — Assessment & Plan Note (Signed)
[place] OFFICE NOTE   NAME:Pelcher, TALOR CHEEMA                    MRN:          [id number]  DATE:11/18/2008                            DOB:          12-25-1946    Mr. Appleby is a pleasant 65 year old gentleman with a past medical  history of hypertension, hyperlipidemia, ulcerative colitis, who I am  asked to evaluate for chest pain.  The patient does state that he has  had a murmur.  Approximately 12 years ago, he also had a problem with  his heart racing, but this has resolved.  In March 2008, he apparently  was scheduled to have a stress Cardiolite as well as ultrasound of his  carotids.  I do not have those records available.  The patient states  that recently Dr. Carlean Purl has been trying to decrease his prednisone.  He is on this for ulcerative colitis.  After decreasing from 10 to 5 mg,  the patient felt week and fatigued.  Approximately 3 weeks ago, he was  working in his yard.  He felt the pain in his chest that was described  as a tightness.  The pain increased with certain movements.  It also  increased with inspiration.  It did not radiate.  There is no associated  nausea, vomiting, shortness of breath, or diaphoresis.  If he did not  move his arms, the pain was not present.  He had another episode this  past Monday while working in his yard, again associated with arm  movements.  If he is active without moving his arms, he does not have  chest pain.  There is no dyspnea on exertion, orthopnea, PND, pedal  edema, palpitations, presyncope, or syncope.  Because of the above, we  were asked to further evaluate.   MEDICATIONS:  1. Adderall 10 mg p.o. daily.  2. Fosamax.  3. Omeprazole 20 mg p.o. daily.  4. Hyoscyamine.  5. Zocor 80 mg p.o. daily.  6. Prednisone 10 mg p.o. daily.  7. Benazepril HCT 20/12.5 mg p.o. daily.  8. Tramadol 50 mg p.o. b.i.d.  9. AndroGel.  10.Calcium/vitamin D.  11.Multivitamin.  12.Uroxatral.  13.He takes Xanax and Allegra as needed.   ALLERGIES:  He has an allergy to ASPIRIN which causes nosebleeds and to  NOVOCAINE.   SOCIAL HISTORY:  He does not smoke.  He consumes a glass of wine in the  evenings occasionally.  He is married.   FAMILY HISTORY:  Positive for coronary artery disease.  His mother had a  coronary bypassing graft at age 22.  He has a brother who had a stent  placed at age approximately 10.  His father died of colon cancer.   PAST MEDICAL HISTORY:  Significant for hypertension and hyperlipidemia.  There is no diabetes mellitus.  He has a history of ulcerative colitis  as described in the HPI.  He has had a prior tonsillectomy as well as  back surgery.  He has also had left shoulder surgery and left elbow  surgery.  Also note that he fell approximately 3 months ago and  fractured several ribs on the right.   REVIEW OF SYSTEMS:  He denies any headaches or fevers, chills.  He  occasionally has a cough but is nonproductive.  There is no hemoptysis.  There is no dysphagia, odynophagia, melena, or hematochezia.  There is  no dysuria or hematuria.  There is no rash or seizure activity.  There  is no orthopnea, PND, or pedal edema.  Remaining systems are negative.   PHYSICAL EXAMINATION:  VITAL SIGNS:  Today shows a blood pressure of  128/73 and his pulse is 64.  His weight is 148 pounds.\  GENERAL:  He is well-developed and well-nourished in no acute distress.  He is not acutely depressed, although mildly anxious.  SKIN:  Warm and dry.  His skin is significant for diffuse ecchymoses.  BACK:  Normal.  HEENT:  Normal.  Normal eyelids.  NECK:  Supple with a normal upstroke bilaterally.  He has a soft left  carotid bruit which is being followed by Dr. Burnice Logan.  There is no  thyromegaly.  I cannot appreciate jugular venous distention.  CHEST:  Clear to auscultation.  No expansion.  CARDIOVASCULAR:  Regular rate and rhythm.   Normal S1 and S2.  There is a  1/6 systolic ejection murmur at the left sternal border.  There is no S3  or S4.  ABDOMEN:  Nontender, distended, positive bowel sounds.  No  hepatosplenomegaly, no masses appreciated.  There is no abdominal bruit.  EXTREMITIES:  There is no peripheral clubbing.  He has 2+ femoral pulses  bilaterally.  No bruits.  Extremities show no edema.  I could palpate no  cords.  He has 2+ posterior tibial pulses bilaterally.  NEUROLOGIC:  Grossly intact.   His electrocardiogram shows a sinus rhythm at a rate of 60.  The axis is  normal.  There are no significant ST changes noted.   DIAGNOSIS:  1. Atypical chest pain - Mr. [place]onkers symptoms are somewhat      atypical and may be musculoskeletal in etiology.  Note, his      electrocardiogram is normal.  However, he has multiple risk factors      including family history, male sex, hypertension, hyperlipidemia.      We will schedule him to have a stress Myoview.  If it shows normal      perfusion, then we will not pursue further cardiac workup.  He will      continue with risk factor modification.  2. Hypertension - his blood pressure is adequately controlled on his      present medications.  3. Hyperlipidemia - he will continue on his statin and this is being      managed by Dr. Burnice Logan.  4. Renal insufficiency - he did have BUN and creatinine checked      recently which is 33 and 1.6.  He apparently has had mild renal      insufficiency in the past and I will ask him to follow up with Dr.      Burnice Logan concerning this issue as well.  5. Ulcerative colitis.   I will see him back on as-needed basis pending the results of his  Myoview.  Also note that he has had carotid Dopplers in the past and he  states that the Dr. Burnice Logan is following this as well.     Denice Bors Stanford Breed, MD, [facility]  Electronically Signed    BSC/MedQ  DD: 11/18/2008  DT: 11/18/2008  Job #: [id number]   cc:  Lowella Bandy. Olevia Perches, MD

## 2011-05-17 NOTE — Assessment & Plan Note (Signed)
[facility] OFFICE NOTE   NAME:Joseph Mejia, Joseph Mejia                    MRN:          [id number]  DATE:08/14/2006                            DOB:          Nov 21, 1946    REASON FOR VISIT:  A 65 year old gentleman seen today for a comprehensive  evaluation.  He has a history of inflammatory bowel disease followed by Dr.  Velora Heckler.  He has hypertension, hypercholesterolemia, and history of  palpitations.  He has been seen a number of professionals for depression.  His main complaint today is depression, fatigue, lack of energy and interest  in daily activities.  His last colonoscopy was in 2002.   FAMILY HISTORY:  Positive for colon cancer, congestive heart failure,  coronary artery disease.   PHYSICAL EXAMINATION:  GENERAL:  Well-developed, healthy-appearing male in  no acute distress.  VITAL SIGNS:  Blood pressure 150/96.  HEENT:  Fundi, ears, nose, and throat clear.  NECK:  No bruits or adenopathy.  CHEST:  Clear.  CARDIOVASCULAR:  Grade 2/6 systolic murmur.  ABDOMEN:  Benign.  No tenderness or organomegaly.  External genitalia  normal.  RECTAL:  Prostate +1 and benign.  Stool heme-negative.  EXTREMITIES:  Full peripheral pulses, no edema.   IMPRESSION:  1. Hypertension, poorly controlled.  2. Depression.  3. Beta-blocker therapy.  4. Inflammatory bowel disease.  5. Hyperlipidemia.   DISPOSITION:  His Toprol-XL will be tapered and discontinued.  He will be  maintained on clonidine.  He will be placed on Benicar 40/12.5 and  reassessed in four weeks.   FOLLOW UP:  He will follow up with Dr. Velora Heckler for a colonoscopy and also  Dr. Cheryln Manly for his depression.  Reassess here in four weeks.                                   Marletta Lor, MD   PFK/MedQ  DD:  08/14/2006  DT:  08/14/2006  Job #:  [id number]

## 2011-05-17 NOTE — Assessment & Plan Note (Signed)
[place] OFFICE NOTE   NAME:[name], [name]                    MRN:          [id number]  DATE:11/05/2006                            DOB:          22-May-1946    Joseph Mejia comes in on November 05, 2006, after his colonoscopy.  I read the results  of the biopsies, which were good, showing very little inflammatory response.  He said he is still taking his 10 mg of prednisone but he has a very  difficult time and he really does not feel like he can cut back on it  because he has tried a number of times to do it alternating with from 10 to  7.5 and so on, and just starts having GI symptoms.  Whether this is  functional or not, he is not sure, but it occurs anyway.  He says his  depression seems to be getting worse.  He has been working with Dr.  Cheryln Manly with this and been on some medication as per Dr. Cheryln Manly for  this as well.  He has also been treated for his osteoporosis by Dr.  Burnice Logan.   Joseph Mejia is taking omeprazole on a daily basis for his heartburn, he says, which  helps him.  As far as his GI tract is concerned, he does have some  alternating diarrhea and constipation, abdominal pain and gas.  He has had  problems with hemorrhoids and he has also lost some weight recently.  We did  talk to him about trying to get back on his Azulfidine and mesalamine, but  it is obvious from reviewing the chart that there was a question about  whether he had some increased BUN/creatinine levels, which we suspected to  be on the basis of the use of that medication so it was discontinued.   It should be noted that [name]'s father had colon cancer at age 55.   PAST MEDICAL HISTORY:  Reveals that he has had hypertension, asthma,  hyperlipidemia, arthritis, has marked anxiety and depression, allergies, and  some back surgery.   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  Does not smoke or drink.   REVIEW OF SYSTEMS:  He notes some recent changes in urination, some skin  rashes, some difficulty with sleeping.  He also described to me some  numbness in his feet that he said Dr. Burnice Logan is aware of.   The laboratory studies that I received recently revealed some mild  eosinophil elevations.  He also had some slight elevation of his BUN at 24,  creatinine at 1.8 which is only minimal, and his cholesterol is slightly  elevated.  There was no B12 level available.   PHYSICAL EXAMINATION:  GENERAL:  He is quite thin.  I have known him a  number of years and taken care of him a number of years.  VITAL SIGNS:  He weighed 142 pounds, was 5 feet 6 inches, blood pressure  130/64, pulse 60 and regular.  HEENT:  His oropharynx was negative although his  tongue was somewhat coated.  NECK:  The supraclavicular area was negative, neck was negative, thyroid  appeared normal and felt normal to palpation.  LUNGS:  Clear.  HEART:  Revealed a regular rhythm.  ABDOMEN:  Soft.  There were no masses or organomegaly, nontender.  There  were no bruits or rubs.  RECTAL:  Deferred.  EXTREMITIES:  Unremarkable.   IMPRESSION:  1. A patient who has steroid-dependent colitis.  2. Anxiety/depression, the latter becoming more severe.  3. History of gastroesophageal reflux disease and gastritis.  4. Hypertension.   RECOMMENDATION:  Continue the same medicines, although I wonder if Dr.  Burnice Logan would consider getting a B12 level on him and following his BUN  and creatinine and maybe we should retry some Azulfidine or mesalamine to  see if we cannot get him off the steroids, or down to at least a 5-mg level.  I wonder about getting CK levels because he is on Zocor and he says he has  some muscle weaknesses.  I will leave those up to his primary care doctor,  Dr. Burnice Logan.  I will plan to see Joseph Mejia back in 3-4 months in followup  unless his symptoms worsen.      Clarene Reamer, MD  Electronically Signed    SML/MedQ  DD: 11/05/2006  DT: 11/06/2006  Job #: [id number]   cc:   Marletta Lor, MD

## 2011-05-17 NOTE — Assessment & Plan Note (Signed)
[facility] OFFICE NOTE   NAME:Joseph Mejia, Joseph Mejia                    MRN:          [id number]  DATE:03/27/2007                            DOB:          10/12/1946    Patient is seen today for followup.  He had radiographs of the neck and  shoulder at [facility].  Apparently this showed some calcium  in his left carotid artery.  Patient also states that, three weeks ago,  he had an episode of significant mid-chest pain that lasted ten minutes.  He has had no recurrent pain.  He does have a history of hypertension  and hyperlipidemia.   Examination today revealed him to be in no acute distress.  Blood  pressure was 90/60 in both arms.  Examination of the neck revealed no  bruits.  Carotid upstroke felt normal.  Chest and cardiovascular exam:  Normal.   IMPRESSION:  1. History of hypertension, presently slightly hypotensive.  2. History of carotid artery calcification.  3. History of chest pain.   DISPOSITION:  In view of his multiple cardiac risk factors, we will set  up for a stress Cardiolite.  In addition, we will do an ultrasound of  his carotid arteries.  His clonidine h.s. will be held and his Benicar  HCT will also be held for 48 hours.  He will then be resumed on Benicar  20 daily.  We will reassess his blood pressure and general status in  three weeks.  To report any focal neurological symptoms or any recurrent  chest pain.     Marletta Lor, MD  Electronically Signed    PFK/MedQ  DD: 03/27/2007  DT: 03/27/2007  Job #: [id number]

## 2011-06-03 ENCOUNTER — Telehealth: Payer: Self-pay | Admitting: Internal Medicine

## 2011-06-03 NOTE — Telephone Encounter (Signed)
I spoke with the patient's wife.  She is not sure about his UC symptoms but she knows he is losing weight she estimates 10-15 lbs in the last 6 weeks.  Patient wants to come in on a Thursday due to work schedule.  He will come in and see Tye Savoy RNP on 06/06/11.

## 2011-06-06 ENCOUNTER — Ambulatory Visit: Payer: Managed Care, Other (non HMO) | Admitting: Nurse Practitioner

## 2011-06-10 ENCOUNTER — Telehealth: Payer: Self-pay | Admitting: Internal Medicine

## 2011-06-10 MED ORDER — SIMVASTATIN 80 MG PO TABS: 80.0000 mg | ORAL_TABLET | Freq: Every day | ORAL | Status: DC

## 2011-06-10 NOTE — Telephone Encounter (Signed)
OK  to0  RF 4#9

## 2011-06-10 NOTE — Telephone Encounter (Signed)
Refill Simvastatin at [facility] order.

## 2011-06-10 NOTE — Telephone Encounter (Signed)
Simvastatin ordered sent to [place]

## 2011-06-21 ENCOUNTER — Ambulatory Visit (INDEPENDENT_AMBULATORY_CARE_PROVIDER_SITE_OTHER): Payer: Managed Care, Other (non HMO) | Admitting: Internal Medicine

## 2011-06-21 ENCOUNTER — Encounter: Payer: Self-pay | Admitting: Internal Medicine

## 2011-06-21 DIAGNOSIS — F988 Other specified behavioral and emotional disorders with onset usually occurring in childhood and adolescence: Secondary | ICD-10-CM

## 2011-06-21 DIAGNOSIS — M25559 Pain in unspecified hip: Secondary | ICD-10-CM

## 2011-06-21 DIAGNOSIS — M1990 Unspecified osteoarthritis, unspecified site: Secondary | ICD-10-CM

## 2011-06-21 DIAGNOSIS — M48061 Spinal stenosis, lumbar region without neurogenic claudication: Secondary | ICD-10-CM

## 2011-06-21 DIAGNOSIS — I10 Essential (primary) hypertension: Secondary | ICD-10-CM

## 2011-06-21 DIAGNOSIS — E785 Hyperlipidemia, unspecified: Secondary | ICD-10-CM

## 2011-06-21 MED ORDER — AMPHETAMINE-DEXTROAMPHET ER 10 MG PO CP24: 10.0000 mg | ORAL_CAPSULE | ORAL | Status: DC

## 2011-06-21 MED ORDER — ALPRAZOLAM 0.5 MG PO TABS: 0.2500 mg | ORAL_TABLET | Freq: Every day | ORAL | Status: DC

## 2011-06-21 MED ORDER — TESTOSTERONE 50 MG/5GM (1%) TD GEL: 5.0000 g | Freq: Every day | TRANSDERMAL | Status: DC

## 2011-06-21 MED ORDER — HYDROCODONE-ACETAMINOPHEN 7.5-750 MG PO TABS: 1.0000 | ORAL_TABLET | Freq: Four times a day (QID) | ORAL | Status: AC | PRN

## 2011-06-21 MED ORDER — SIMVASTATIN 80 MG PO TABS: 80.0000 mg | ORAL_TABLET | Freq: Every day | ORAL | Status: DC

## 2011-06-21 MED ORDER — PREDNISONE 2.5 MG PO TABS: ORAL_TABLET | ORAL | Status: DC

## 2011-06-21 MED ORDER — ALBUTEROL SULFATE HFA 108 (90 BASE) MCG/ACT IN AERS: 2.0000 | INHALATION_SPRAY | Freq: Four times a day (QID) | RESPIRATORY_TRACT | Status: DC

## 2011-06-21 MED ORDER — BUPROPION HCL ER (SR) 150 MG PO TB12: 150.0000 mg | ORAL_TABLET | ORAL | Status: DC

## 2011-06-21 MED ORDER — BENAZEPRIL-HYDROCHLOROTHIAZIDE 20-12.5 MG PO TABS: 1.0000 | ORAL_TABLET | Freq: Every day | ORAL | Status: DC

## 2011-06-21 MED ORDER — OMEPRAZOLE 20 MG PO CPDR: 20.0000 mg | DELAYED_RELEASE_CAPSULE | Freq: Every day | ORAL | Status: DC

## 2011-06-21 NOTE — Progress Notes (Signed)
  Subjective:    Patient ID: Joseph Mejia, male    DOB: [birth year], 65 y.o.   MRN: [id number]  HPI  and 65 year old patient who is seen today for followup of his multiple medical problems he has a history of ulcerative colitis which has been stable on low-dose alternate day they're obtained. His main complaints today are orthopedic. He continues to have low back pain with radiation down the right leg. He did have the MRI performed earlier that revealed moderate spinal stenosis. He has had a prior lumbar laminectomy in the past. He is requesting referral to [facility] orthopedics. For the past month she has had some right shoulder pain and paresthesias and weakness involving his right hand. He has a history of ADHD and needs medication refills. He is requesting a refill on his analgesics. He has anxiety and requires medication refill.  Wt Readings from Last 3 Encounters:  06/21/11 137 lb (62.143 kg)  03/29/11 140 lb (63.504 kg)  02/01/11 145 lb (65.772 kg)     Review of Systems  Constitutional: Negative for fever, chills, appetite change and fatigue.  HENT: Negative for hearing loss, ear pain, congestion, sore throat, trouble swallowing, neck stiffness, dental problem, voice change and tinnitus.   Eyes: Negative for pain, discharge and visual disturbance.  Respiratory: Negative for cough, chest tightness, wheezing and stridor.   Cardiovascular: Negative for chest pain, palpitations and leg swelling.  Gastrointestinal: Negative for nausea, vomiting, abdominal pain, diarrhea, constipation, blood in stool and abdominal distention.  Genitourinary: Negative for urgency, hematuria, flank pain, discharge, difficulty urinating and genital sores.  Musculoskeletal: Positive for back pain. Negative for myalgias, joint swelling, arthralgias and gait problem.  Skin: Negative for rash.  Neurological: Positive for numbness. Negative for dizziness, syncope, speech difficulty, weakness and headaches.    Hematological: Negative for adenopathy. Does not bruise/bleed easily.  Psychiatric/Behavioral: Negative for behavioral problems and dysphoric mood. The patient is not nervous/anxious.        Objective:   Physical Exam  Constitutional: He is oriented to person, place, and time. He appears well-developed.  HENT:  Head: Normocephalic.  Right Ear: External ear normal.  Left Ear: External ear normal.  Eyes: Conjunctivae and EOM are normal.  Neck: Normal range of motion.  Cardiovascular: Normal rate and normal heart sounds.   Pulmonary/Chest: Breath sounds normal.  Abdominal: Bowel sounds are normal.  Musculoskeletal: Normal range of motion. He exhibits no edema and no tenderness.  Neurological: He is alert and oriented to person, place, and time.       Grip strength appeared fairly symmetrical Reflexes were brisk and equal bilaterally This included biceps triceps patellar and Achilles  Psychiatric: He has a normal mood and affect. His behavior is normal.          Assessment & Plan:   Osteoarthritis with spinal stenosis Rule out cervical radiculopathy Ulcerative colitis Hypertension stable ADHD. Medications refilled Anxiety. Medications refilled  We'll set up referral to [facility] orthopedics. May need cervical MRI

## 2011-06-21 NOTE — Patient Instructions (Signed)
Followup Dr. Leroy Kennedy and greens for orthopedics as discussed  Return here 3 months for followup

## 2011-07-19 ENCOUNTER — Ambulatory Visit: Payer: Managed Care, Other (non HMO) | Admitting: Internal Medicine

## 2011-08-09 ENCOUNTER — Other Ambulatory Visit (INDEPENDENT_AMBULATORY_CARE_PROVIDER_SITE_OTHER): Payer: Managed Care, Other (non HMO)

## 2011-08-09 ENCOUNTER — Encounter: Payer: Self-pay | Admitting: Internal Medicine

## 2011-08-09 ENCOUNTER — Ambulatory Visit (INDEPENDENT_AMBULATORY_CARE_PROVIDER_SITE_OTHER): Payer: Managed Care, Other (non HMO) | Admitting: Internal Medicine

## 2011-08-09 VITALS — BP 98/58 | HR 88 | Ht 66.0 in | Wt 136.0 lb

## 2011-08-09 DIAGNOSIS — K589 Irritable bowel syndrome without diarrhea: Secondary | ICD-10-CM

## 2011-08-09 DIAGNOSIS — R634 Abnormal weight loss: Secondary | ICD-10-CM

## 2011-08-09 DIAGNOSIS — K219 Gastro-esophageal reflux disease without esophagitis: Secondary | ICD-10-CM

## 2011-08-09 DIAGNOSIS — IMO0002 Reserved for concepts with insufficient information to code with codable children: Secondary | ICD-10-CM

## 2011-08-09 DIAGNOSIS — M5137 Other intervertebral disc degeneration, lumbosacral region: Secondary | ICD-10-CM

## 2011-08-09 DIAGNOSIS — M79609 Pain in unspecified limb: Secondary | ICD-10-CM

## 2011-08-09 DIAGNOSIS — N182 Chronic kidney disease, stage 2 (mild): Secondary | ICD-10-CM

## 2011-08-09 DIAGNOSIS — F411 Generalized anxiety disorder: Secondary | ICD-10-CM

## 2011-08-09 DIAGNOSIS — K5100 Ulcerative (chronic) pancolitis without complications: Secondary | ICD-10-CM

## 2011-08-09 DIAGNOSIS — M5136 Other intervertebral disc degeneration, lumbar region: Secondary | ICD-10-CM

## 2011-08-09 DIAGNOSIS — M51379 Other intervertebral disc degeneration, lumbosacral region without mention of lumbar back pain or lower extremity pain: Secondary | ICD-10-CM

## 2011-08-09 LAB — COMPREHENSIVE METABOLIC PANEL
ALT: 27 U/L (ref 0–53)
AST: 27 U/L (ref 0–37)
Albumin: 4.3 g/dL (ref 3.5–5.2)
Calcium: 9.9 mg/dL (ref 8.4–10.5)
Chloride: 101 mEq/L (ref 96–112)
Potassium: 4.4 mEq/L (ref 3.5–5.1)
Total Protein: 7.3 g/dL (ref 6.0–8.3)

## 2011-08-09 NOTE — Patient Instructions (Addendum)
Please go to the basement upon leaving today to have your labs done. Stay on 2 mg of the Prednisone daily. We will call you with lab results. Return to see Dr. Leone Payor in about 3 months.

## 2011-08-09 NOTE — Progress Notes (Signed)
  Subjective:    Patient ID: Joseph Mejia, male    DOB: 08-09-1946, 65 y.o.   MRN: [id number]  HPI Tapering prednisone and down to 2/1 went back to 2 mg daily. Struggling with back pain and has a nerve impingement in lumbar area and also began having right hand and finger problems with pain - cervical spine MRI checked but pending. Some left hand symptoms also.  Weight loss also - sold his house and moving into new town home. One brother died 04-10-2023 and another brother had a head injury and is disabled. Stressful times. Living in a motel some also and renting a room.  Schedule at work is such that once a month has to go to work at [employer] 1 weekend a month. More anxiety from that. This really causes a disruption. Upset about daughter needing gynecological surgery.  Significant fatigue. Better with 2 mg daily, increased on his own. ?'s about how do we know when adrenal gland kicks in. Concerned about being completely off prednisone.  Ulcerative colitis is ok.   Review of Systems     Objective:   Physical Exam Thin NAD abd soft and nontender w/o mass Mood is slightly anxious, ? Slight tearing when discussing his stressors and physical ailments       Assessment & Plan:  CMET and TSH 2 mg daily pred Delay colo see me 3 mos Dr. Shon Baton  Anxiety/stress ? Other Tx

## 2011-08-10 DIAGNOSIS — IMO0002 Reserved for concepts with insufficient information to code with codable children: Secondary | ICD-10-CM | POA: Insufficient documentation

## 2011-08-10 DIAGNOSIS — R634 Abnormal weight loss: Secondary | ICD-10-CM | POA: Insufficient documentation

## 2011-08-10 NOTE — Assessment & Plan Note (Signed)
Appears asymptomatic at present.

## 2011-08-10 NOTE — Assessment & Plan Note (Signed)
10# in several months. I believe this is related to situational stressors, anxiety. Reassess through PCP and in 3 months.

## 2011-08-10 NOTE — Assessment & Plan Note (Signed)
Struggling to come off the steroids. Has gotten down but felt terrible at 2/1 mg alternating. Feels better at 2 mg daily. He admits could be psychological and not physiologic but is concerned abut this. Hold at 2 mg daily for now. Await colonoscopy later this year to bring down further.

## 2011-08-10 NOTE — Assessment & Plan Note (Signed)
Multiple stressors: family death, injury, moving, physical ailments (spinal and related sxs) are causing this to flare. Following up with PCP. Did not discuss today but ? counseling but I suspect that once he moves and gets diagnosis and plans on C-spine and L-spine issues things will be better.

## 2011-08-10 NOTE — Progress Notes (Signed)
Quick Note:  Thyroid working ok Kidney function abnormal as it has been - over time  Needs to be watched Repeat BMET 1 month re renal insufficiency ______

## 2011-08-10 NOTE — Assessment & Plan Note (Signed)
Asymptomatic. Is at 3 years since last screening colonoscopy but will defer for 3-6 months given other issues. Needs propofol. Continue current therapy. Only on prednisone so stay at 2 mg daily given overall situation. ? If UC is "burned out" and now more IBS. Leave at 2 mg daily prednisone until can get colonoscopy done. I will see him in 3 months to arrange that.

## 2011-08-12 ENCOUNTER — Other Ambulatory Visit: Payer: Self-pay

## 2011-08-12 DIAGNOSIS — N289 Disorder of kidney and ureter, unspecified: Secondary | ICD-10-CM

## 2011-09-10 ENCOUNTER — Other Ambulatory Visit (INDEPENDENT_AMBULATORY_CARE_PROVIDER_SITE_OTHER): Payer: Managed Care, Other (non HMO)

## 2011-09-10 DIAGNOSIS — N289 Disorder of kidney and ureter, unspecified: Secondary | ICD-10-CM

## 2011-09-11 LAB — BASIC METABOLIC PANEL
BUN: 36 mg/dL — ABNORMAL HIGH (ref 6–23)
Calcium: 8.7 mg/dL (ref 8.4–10.5)
Creatinine, Ser: 1.6 mg/dL — ABNORMAL HIGH (ref 0.4–1.5)
GFR: 46.01 mL/min — ABNORMAL LOW (ref >60.00)
Glucose, Bld: 106 mg/dL — ABNORMAL HIGH (ref 70–99)
Potassium: 3.7 mEq/L (ref 3.5–5.1)

## 2011-09-12 ENCOUNTER — Telehealth: Payer: Self-pay

## 2011-09-12 NOTE — Progress Notes (Signed)
Quick Note:  See result note in case I did not send it to you ______

## 2011-09-12 NOTE — Progress Notes (Signed)
Quick Note:  Kidney function is a bit better - had been slightly worse If he gets diarrhea problems and is at risk of dehydration he needs to get attention via phone or be seen as at higher risk of kidney injury due to meds he is on. He needs to see me Nov or Dec ______

## 2011-09-12 NOTE — Telephone Encounter (Signed)
Patient advised of results and that he needs an office visit with Dr Leone Payor for Nov or December

## 2011-09-12 NOTE — Telephone Encounter (Signed)
Message copied by Annett Fabian on Thu Sep 12, 2011 10:13 AM ------      Message from: Annett Fabian      Created: Mon Aug 12, 2011  2:32 PM       Needs labs

## 2011-09-17 ENCOUNTER — Other Ambulatory Visit: Payer: Self-pay

## 2011-09-17 MED ORDER — ZOLPIDEM TARTRATE 10 MG PO TABS: 10.0000 mg | ORAL_TABLET | Freq: Every evening | ORAL | Status: DC | PRN

## 2011-09-17 NOTE — Telephone Encounter (Signed)
Faxed back to pharm

## 2011-09-23 ENCOUNTER — Telehealth: Payer: Self-pay | Admitting: Internal Medicine

## 2011-09-23 NOTE — Telephone Encounter (Signed)
Pts spouse called mail order pharmacy re: status of pts Ambien, and was told that no response had been rcvd by pts pcp. Pt is completely out of med. Pls resend script to [place] and also sent a partial refill to [facility] in [facility] on [address] [phone], until pts mail order arrives.

## 2011-09-24 ENCOUNTER — Other Ambulatory Visit: Payer: Self-pay

## 2011-09-24 MED ORDER — ZOLPIDEM TARTRATE 10 MG PO TABS: 10.0000 mg | ORAL_TABLET | Freq: Every evening | ORAL | Status: DC | PRN

## 2011-09-24 NOTE — Telephone Encounter (Signed)
Rosann Auerbach was already faxed corrected rx - will call in short term rx to [facility].

## 2011-09-24 NOTE — Telephone Encounter (Signed)
Open in error

## 2011-09-25 ENCOUNTER — Other Ambulatory Visit: Payer: Self-pay | Admitting: Internal Medicine

## 2011-09-27 ENCOUNTER — Ambulatory Visit: Payer: Managed Care, Other (non HMO) | Admitting: Internal Medicine

## 2011-10-01 ENCOUNTER — Ambulatory Visit: Payer: Managed Care, Other (non HMO) | Admitting: Internal Medicine

## 2011-10-04 ENCOUNTER — Ambulatory Visit: Payer: Managed Care, Other (non HMO) | Admitting: Internal Medicine

## 2011-10-09 ENCOUNTER — Other Ambulatory Visit: Payer: Self-pay | Admitting: Dermatology

## 2011-10-11 ENCOUNTER — Encounter: Payer: Self-pay | Admitting: Internal Medicine

## 2011-10-11 ENCOUNTER — Ambulatory Visit (INDEPENDENT_AMBULATORY_CARE_PROVIDER_SITE_OTHER): Payer: Managed Care, Other (non HMO) | Admitting: Internal Medicine

## 2011-10-11 DIAGNOSIS — M5090 Cervical disc disorder, unspecified, unspecified cervical region: Secondary | ICD-10-CM

## 2011-10-11 DIAGNOSIS — K5100 Ulcerative (chronic) pancolitis without complications: Secondary | ICD-10-CM

## 2011-10-11 DIAGNOSIS — I10 Essential (primary) hypertension: Secondary | ICD-10-CM

## 2011-10-11 DIAGNOSIS — M1990 Unspecified osteoarthritis, unspecified site: Secondary | ICD-10-CM

## 2011-10-11 DIAGNOSIS — M79609 Pain in unspecified limb: Secondary | ICD-10-CM

## 2011-10-11 DIAGNOSIS — F988 Other specified behavioral and emotional disorders with onset usually occurring in childhood and adolescence: Secondary | ICD-10-CM

## 2011-10-11 MED ORDER — LISINOPRIL 20 MG PO TABS: 20.0000 mg | ORAL_TABLET | Freq: Every day | ORAL | Status: DC

## 2011-10-11 MED ORDER — AMPHETAMINE-DEXTROAMPHET ER 10 MG PO CP24: 10.0000 mg | ORAL_CAPSULE | ORAL | Status: DC

## 2011-10-11 NOTE — Patient Instructions (Signed)
Limit your sodium (Salt) intake  Return in 3 months for follow-up  GI followup as scheduled  Neurosurgical followup

## 2011-10-11 NOTE — Progress Notes (Signed)
  Subjective:    Patient ID: Joseph Mejia, male    DOB: 05-22-1946, 65 y.o.   MRN: [id number]  HPI  65 year old patient who is seen today for followup. He is followed closely by GI for his ulcerative colitis and is scheduled for followup next month. He remains on low dose prednisone therapy. He has ADHD and requires medication refills of his Adderall He has treated hypertension. He has had some low readings. Blood pressure today was in a low normal range Complaints today include bilateral hand pain this sounds more like osteoarthritis. He has been seen at [facility] for orthopedics and a cervical MRI has been obtained he does have a moderate right paracentral disc extrusion that deforms the cord. Is requesting a referral to Dr. Donalee Citrin    Review of Systems  Constitutional: Positive for fatigue. Negative for fever, chills and appetite change.  HENT: Negative for hearing loss, ear pain, congestion, sore throat, trouble swallowing, neck stiffness, dental problem, voice change and tinnitus.   Eyes: Negative for pain, discharge and visual disturbance.  Respiratory: Negative for cough, chest tightness, wheezing and stridor.   Cardiovascular: Negative for chest pain, palpitations and leg swelling.  Gastrointestinal: Negative for nausea, vomiting, abdominal pain, diarrhea, constipation, blood in stool and abdominal distention.  Genitourinary: Negative for urgency, hematuria, flank pain, discharge, difficulty urinating and genital sores.  Musculoskeletal: Positive for back pain and joint swelling. Negative for myalgias, arthralgias and gait problem.  Skin: Negative for rash.  Neurological: Positive for light-headedness. Negative for dizziness, syncope, speech difficulty, weakness, numbness and headaches.  Hematological: Negative for adenopathy. Does not bruise/bleed easily.  Psychiatric/Behavioral: Negative for behavioral problems and dysphoric mood. The patient is not nervous/anxious.          Objective:   Physical Exam  Constitutional: He is oriented to person, place, and time. He appears well-developed.        Blood pressure 96/70  HENT:  Head: Normocephalic.  Right Ear: External ear normal.  Left Ear: External ear normal.  Eyes: Conjunctivae and EOM are normal.  Neck: Normal range of motion.  Cardiovascular: Normal rate and normal heart sounds.   Pulmonary/Chest: Breath sounds normal.  Abdominal: Bowel sounds are normal.  Musculoskeletal: Normal range of motion. He exhibits no edema and no tenderness.       Osteoarthritic changes involving the small joints of the hands  Neurological: He is alert and oriented to person, place, and time.       He does appear to have diminished grip strength bilaterally  Psychiatric: He has a normal mood and affect. His behavior is normal.          Assessment & Plan:   Cervical disc disease. We'll set up for neurosurgical evaluation Osteoarthritis Hypertension. We'll discontinue combination therapy and treat with lisinopril only. Diuretic therapy will be discontinued A DD. Adderall refill completed

## 2011-10-30 ENCOUNTER — Other Ambulatory Visit: Payer: Self-pay | Admitting: Internal Medicine

## 2011-11-07 ENCOUNTER — Ambulatory Visit: Payer: Managed Care, Other (non HMO) | Admitting: Internal Medicine

## 2011-12-03 ENCOUNTER — Telehealth: Payer: Self-pay | Admitting: Internal Medicine

## 2011-12-04 NOTE — Telephone Encounter (Signed)
See notes from the [facility] patient called back and declined to schedule at this time

## 2011-12-04 NOTE — Telephone Encounter (Signed)
Per Dr Marvell Fuller direction on labs from 09/12/2011 patient needs seen in the office in Nov or Dec not a direct procedure.  I have left a message for him to call back to discuss

## 2011-12-05 ENCOUNTER — Ambulatory Visit
Admission: RE | Admit: 2011-12-05 | Discharge: 2011-12-05 | Disposition: A | Discharge status: Home / Self Care | Payer: Managed Care, Other (non HMO) | Source: Ambulatory Visit | Attending: Neurosurgery | Admitting: Neurosurgery

## 2011-12-05 ENCOUNTER — Other Ambulatory Visit: Payer: Self-pay | Admitting: Neurosurgery

## 2011-12-05 DIAGNOSIS — M542 Cervicalgia: Secondary | ICD-10-CM

## 2011-12-05 NOTE — Telephone Encounter (Signed)
Patient aware he will call back after his surgery

## 2011-12-05 NOTE — Telephone Encounter (Signed)
He can do a direct colonoscopy but needs propofol - I was thinking of seeing him in office to be sure it was done that way but I think we have worked out direct propofol so ok.  Indication is screening and UC

## 2011-12-17 ENCOUNTER — Other Ambulatory Visit: Payer: Self-pay | Admitting: Internal Medicine

## 2011-12-17 MED ORDER — HYOSCYAMINE SULFATE CR 0.375 MG PO CP12: 0.3750 mg | ORAL_CAPSULE | Freq: Every day | ORAL | Status: DC

## 2011-12-17 NOTE — Telephone Encounter (Signed)
Pt requesting refill on  hyoscyamine (LEVSINEX) 0.375 MG 12 hr capsule    [facility] Delivery [phone]

## 2012-01-08 ENCOUNTER — Encounter: Payer: Self-pay | Admitting: Internal Medicine

## 2012-01-08 ENCOUNTER — Ambulatory Visit (INDEPENDENT_AMBULATORY_CARE_PROVIDER_SITE_OTHER): Payer: Managed Care, Other (non HMO) | Admitting: Internal Medicine

## 2012-01-08 DIAGNOSIS — IMO0002 Reserved for concepts with insufficient information to code with codable children: Secondary | ICD-10-CM

## 2012-01-08 DIAGNOSIS — E785 Hyperlipidemia, unspecified: Secondary | ICD-10-CM

## 2012-01-08 DIAGNOSIS — I10 Essential (primary) hypertension: Secondary | ICD-10-CM

## 2012-01-08 DIAGNOSIS — M1990 Unspecified osteoarthritis, unspecified site: Secondary | ICD-10-CM

## 2012-01-08 DIAGNOSIS — K5100 Ulcerative (chronic) pancolitis without complications: Secondary | ICD-10-CM

## 2012-01-08 DIAGNOSIS — F988 Other specified behavioral and emotional disorders with onset usually occurring in childhood and adolescence: Secondary | ICD-10-CM

## 2012-01-08 MED ORDER — AMPHETAMINE-DEXTROAMPHET ER 10 MG PO CP24: 10.0000 mg | ORAL_CAPSULE | ORAL | Status: DC

## 2012-01-08 MED ORDER — HYDROCODONE-ACETAMINOPHEN 5-500 MG PO TABS: 1.0000 | ORAL_TABLET | ORAL | Status: DC | PRN

## 2012-01-08 NOTE — Patient Instructions (Signed)
Limit your sodium (Salt) intake    It is important that you exercise regularly, at least 20 minutes 3 to 4 times per week.  If you develop chest pain or shortness of breath seek  medical attention.  Take a calcium supplement, plus 800-1200 units of vitamin D  Return in 6 months for follow-up  

## 2012-01-08 NOTE — Progress Notes (Signed)
  Subjective:    Patient ID: Joseph Mejia, male    DOB: 04-Feb-1946, 66 y.o.   MRN: [id number]  HPI  66 year old patient who is in today for followup. He has a history of ulcerative colitis and is scheduled for followup colonoscopy this year. Since his last visit here he has had C-spine surgery as well as a right-sided carpal tunnel release. He is doing quite well. He has ADD and requires medication refill. He has treated hypertension as well as dyslipidemia. He denies any cardiac symptoms. He plans to return to work in the near future and work until 55.    Review of Systems  Constitutional: Negative for fever, chills, appetite change and fatigue.  HENT: Negative for hearing loss, ear pain, congestion, sore throat, trouble swallowing, neck stiffness, dental problem, voice change and tinnitus.   Eyes: Negative for pain, discharge and visual disturbance.  Respiratory: Negative for cough, chest tightness, wheezing and stridor.   Cardiovascular: Negative for chest pain, palpitations and leg swelling.  Gastrointestinal: Negative for nausea, vomiting, abdominal pain, diarrhea, constipation, blood in stool and abdominal distention.  Genitourinary: Negative for urgency, hematuria, flank pain, discharge, difficulty urinating and genital sores.  Musculoskeletal: Positive for back pain and arthralgias. Negative for myalgias, joint swelling and gait problem.  Skin: Negative for rash.  Neurological: Negative for dizziness, syncope, speech difficulty, weakness, numbness and headaches.  Hematological: Negative for adenopathy. Does not bruise/bleed easily.  Psychiatric/Behavioral: Negative for behavioral problems and dysphoric mood. The patient is not nervous/anxious.        Objective:   Physical Exam  Constitutional: He is oriented to person, place, and time. He appears well-developed.  HENT:  Head: Normocephalic.  Right Ear: External ear normal.  Left Ear: External ear normal.  Eyes: Conjunctivae  and EOM are normal.  Neck: Normal range of motion.  Cardiovascular: Normal rate and normal heart sounds.   Pulmonary/Chest: Breath sounds normal.  Abdominal: Bowel sounds are normal.  Musculoskeletal: Normal range of motion. He exhibits no edema and no tenderness.  Neurological: He is alert and oriented to person, place, and time.  Skin:       Healing scar involving me right anterior neck and right wrist  Psychiatric: He has a normal mood and affect. His behavior is normal.          Assessment & Plan:   ADD. Well controlled on present medicines. We'll refill for 3 months Hypertension well controlled blood pressure low normal today. We'll continue present regimen Dyslipidemia stable Ulcerative colitis. Followup GI and follow colonoscopy this year  Recheck in 6 months

## 2012-01-23 ENCOUNTER — Ambulatory Visit
Admission: RE | Admit: 2012-01-23 | Discharge: 2012-01-23 | Disposition: A | Discharge status: Home / Self Care | Payer: Managed Care, Other (non HMO) | Source: Ambulatory Visit | Attending: Neurosurgery | Admitting: Neurosurgery

## 2012-01-23 ENCOUNTER — Other Ambulatory Visit: Payer: Self-pay | Admitting: Neurosurgery

## 2012-01-23 DIAGNOSIS — M542 Cervicalgia: Secondary | ICD-10-CM

## 2012-02-07 ENCOUNTER — Other Ambulatory Visit: Payer: Self-pay

## 2012-02-07 MED ORDER — TESTOSTERONE 50 MG/5GM (1%) TD GEL: 5.0000 g | Freq: Every day | TRANSDERMAL | Status: DC

## 2012-02-07 NOTE — Telephone Encounter (Signed)
Faxed rx to [facility]

## 2012-03-26 ENCOUNTER — Ambulatory Visit
Admission: RE | Admit: 2012-03-26 | Discharge: 2012-03-26 | Disposition: A | Discharge status: Home / Self Care | Payer: Managed Care, Other (non HMO) | Source: Ambulatory Visit | Attending: Neurosurgery | Admitting: Neurosurgery

## 2012-03-26 ENCOUNTER — Other Ambulatory Visit: Payer: Self-pay | Admitting: Neurosurgery

## 2012-03-26 DIAGNOSIS — M542 Cervicalgia: Secondary | ICD-10-CM

## 2012-04-03 ENCOUNTER — Other Ambulatory Visit: Payer: Self-pay

## 2012-04-03 MED ORDER — ALPRAZOLAM 0.5 MG PO TABS: 0.2500 mg | ORAL_TABLET | Freq: Every day | ORAL | Status: DC

## 2012-04-07 ENCOUNTER — Telehealth: Payer: Self-pay | Admitting: Family Medicine

## 2012-04-07 MED ORDER — FEXOFENADINE-PSEUDOEPHED ER 60-120 MG PO TB12: 1.0000 | ORAL_TABLET | Freq: Two times a day (BID) | ORAL | Status: DC

## 2012-04-07 NOTE — Telephone Encounter (Signed)
Pt requesting Rx for Allegra-D. Can get OTC, but cheaper if they have RX (for insurance). Please send this Rx to [facility] in [place]. Thanks.

## 2012-04-07 NOTE — Telephone Encounter (Signed)
Pt aware.

## 2012-04-09 ENCOUNTER — Other Ambulatory Visit: Payer: Self-pay

## 2012-04-09 MED ORDER — OMEPRAZOLE 20 MG PO CPDR: 20.0000 mg | DELAYED_RELEASE_CAPSULE | Freq: Every day | ORAL | Status: DC

## 2012-05-11 ENCOUNTER — Telehealth: Payer: Self-pay | Admitting: Internal Medicine

## 2012-05-11 DIAGNOSIS — F988 Other specified behavioral and emotional disorders with onset usually occurring in childhood and adolescence: Secondary | ICD-10-CM

## 2012-05-11 MED ORDER — AMPHETAMINE-DEXTROAMPHET ER 10 MG PO CP24: 10.0000 mg | ORAL_CAPSULE | ORAL | Status: DC

## 2012-05-11 NOTE — Telephone Encounter (Signed)
rx's ready for pick up

## 2012-05-11 NOTE — Telephone Encounter (Signed)
Pt requesting refill on amphetamine-dextroamphetamine (ADDERALL XR) 10 MG 24 hr capsule

## 2012-06-10 ENCOUNTER — Telehealth: Payer: Self-pay | Admitting: Internal Medicine

## 2012-06-10 MED ORDER — ZOLPIDEM TARTRATE 10 MG PO TABS: 10.0000 mg | ORAL_TABLET | Freq: Every evening | ORAL | Status: DC | PRN

## 2012-06-10 NOTE — Telephone Encounter (Signed)
Done called in

## 2012-06-10 NOTE — Telephone Encounter (Signed)
Patient's wife called stating that patient need a 30 day refill of ambien called into [facility] in [place]. Please assist.

## 2012-06-29 ENCOUNTER — Other Ambulatory Visit (INDEPENDENT_AMBULATORY_CARE_PROVIDER_SITE_OTHER): Payer: Managed Care, Other (non HMO)

## 2012-06-29 DIAGNOSIS — Z0000 Encounter for general adult medical examination without abnormal findings: Secondary | ICD-10-CM

## 2012-06-29 LAB — BASIC METABOLIC PANEL
BUN: 27 mg/dL — ABNORMAL HIGH (ref 6–23)
GFR: 46.23 mL/min — ABNORMAL LOW (ref >60.00)
Potassium: 4.3 mEq/L (ref 3.5–5.1)
Sodium: 140 mEq/L (ref 135–145)

## 2012-06-29 LAB — POCT URINALYSIS DIPSTICK
Leukocytes, UA: NEGATIVE
Protein, UA: NEGATIVE
Urobilinogen, UA: 0.2

## 2012-06-29 LAB — CBC WITH DIFFERENTIAL/PLATELET
Eosinophils Relative: 5.1 % — ABNORMAL HIGH (ref 0.0–5.0)
HCT: 42.7 % (ref 39.0–52.0)
Lymphs Abs: 2.6 10*3/uL (ref 0.7–4.0)
Monocytes Relative: 10.6 % (ref 3.0–12.0)
Platelets: 195 10*3/uL (ref 150.0–400.0)
WBC: 6.9 10*3/uL (ref 4.5–10.5)

## 2012-06-29 LAB — LIPID PANEL
LDL Cholesterol: 112 mg/dL — ABNORMAL HIGH (ref 0–99)
Total CHOL/HDL Ratio: 4
VLDL: 23.8 mg/dL (ref 0.0–40.0)

## 2012-06-29 LAB — PSA: PSA: 0.66 ng/mL (ref 0.10–4.00)

## 2012-06-29 LAB — HEPATIC FUNCTION PANEL
ALT: 22 U/L (ref 0–53)
AST: 25 U/L (ref 0–37)
Alkaline Phosphatase: 56 U/L (ref 39–117)
Bilirubin, Direct: 0.1 mg/dL (ref 0.0–0.3)
Total Bilirubin: 0.5 mg/dL (ref 0.3–1.2)

## 2012-07-07 ENCOUNTER — Ambulatory Visit: Payer: Managed Care, Other (non HMO) | Admitting: Internal Medicine

## 2012-07-09 ENCOUNTER — Ambulatory Visit (INDEPENDENT_AMBULATORY_CARE_PROVIDER_SITE_OTHER): Payer: Managed Care, Other (non HMO) | Admitting: Internal Medicine

## 2012-07-09 ENCOUNTER — Encounter: Payer: Self-pay | Admitting: Internal Medicine

## 2012-07-09 VITALS — BP 110/74 | HR 62 | Temp 97.8°F | Resp 18 | Ht 65.5 in | Wt 141.0 lb

## 2012-07-09 DIAGNOSIS — I10 Essential (primary) hypertension: Secondary | ICD-10-CM

## 2012-07-09 DIAGNOSIS — F411 Generalized anxiety disorder: Secondary | ICD-10-CM

## 2012-07-09 DIAGNOSIS — N182 Chronic kidney disease, stage 2 (mild): Secondary | ICD-10-CM

## 2012-07-09 DIAGNOSIS — E785 Hyperlipidemia, unspecified: Secondary | ICD-10-CM

## 2012-07-09 DIAGNOSIS — Z0000 Encounter for general adult medical examination without abnormal findings: Secondary | ICD-10-CM

## 2012-07-09 DIAGNOSIS — K5100 Ulcerative (chronic) pancolitis without complications: Secondary | ICD-10-CM

## 2012-07-09 DIAGNOSIS — Z23 Encounter for immunization: Secondary | ICD-10-CM

## 2012-07-09 MED ORDER — HYDROCODONE-ACETAMINOPHEN 5-500 MG PO TABS: 1.0000 | ORAL_TABLET | ORAL | Status: DC | PRN

## 2012-07-09 MED ORDER — GABAPENTIN 300 MG PO CAPS: 300.0000 mg | ORAL_CAPSULE | Freq: Three times a day (TID) | ORAL | Status: DC

## 2012-07-09 MED ORDER — LISINOPRIL 20 MG PO TABS: 20.0000 mg | ORAL_TABLET | Freq: Every day | ORAL | Status: DC

## 2012-07-09 MED ORDER — BUPROPION HCL ER (SR) 150 MG PO TB12: 150.0000 mg | ORAL_TABLET | ORAL | Status: DC

## 2012-07-09 MED ORDER — TESTOSTERONE 50 MG/5GM (1%) TD GEL: 5.0000 g | Freq: Every day | TRANSDERMAL | Status: DC

## 2012-07-09 MED ORDER — ALPRAZOLAM 0.5 MG PO TABS: 0.2500 mg | ORAL_TABLET | Freq: Every day | ORAL | Status: DC

## 2012-07-09 MED ORDER — ZOLPIDEM TARTRATE 10 MG PO TABS: 10.0000 mg | ORAL_TABLET | Freq: Every evening | ORAL | Status: DC | PRN

## 2012-07-09 MED ORDER — ATORVASTATIN CALCIUM 40 MG PO TABS: 40.0000 mg | ORAL_TABLET | Freq: Every day | ORAL | Status: DC

## 2012-07-09 MED ORDER — OMEPRAZOLE 20 MG PO CPDR: 20.0000 mg | DELAYED_RELEASE_CAPSULE | Freq: Every day | ORAL | Status: DC

## 2012-07-09 NOTE — Progress Notes (Signed)
Subjective:    Patient ID: Joseph Mejia, male    DOB: 07-03-1946, 66 y.o.   MRN: [id number]  HPI  66 year old patient who is in today for a wellness exam. He is followed by GI due to the universal colitis. His main complaints today are arthritic mainly involving the small joints of the hands left greater than right. He also has had some mild Dupuytren's contractures involving the right hand. Since his last physical he has had C-spine surgery in November as well as a right-sided carpal toe release in December of last year  Past Medical History  Diagnosis Date  . HYPERLIPIDEMIA 08/03/2007  . ANXIETY 08/03/2007  . DEPRESSION 08/03/2007  . ADD 02/23/2008  . HYPERTENSION 08/03/2007  . ASTHMA 08/03/2007  . GERD 08/03/2007  . UNIVERSAL ULCERATIVE COLITIS 01/26/2009  . Irritable bowel syndrome 11/02/2008  . RENAL DISEASE, CHRONIC, MILD 11/04/2008  . ERECTILE DYSFUNCTION, ORGANIC 02/23/2008  . OSTEOARTHRITIS 11/02/2008  . HIP PAIN, RIGHT 05/27/2008  . HAND PAIN, LEFT 06/20/2009  . OSTEOPENIA 07/26/2008  . CHEST PAIN 11/17/2008  . Closed fracture of four ribs 06/30/2008  . Failed moderate sedation during procedure 11/02/2008    History   Social History  . Marital Status: Married    Spouse Name: N/A    Number of Children: N/A  . Years of Education: N/A   Occupational History  . Not on file.   Social History Main Topics  . Smoking status: Never Smoker   . Smokeless tobacco: Never Used  . Alcohol Use: 4.2 oz/week    7 Glasses of wine per week  . Drug Use: No  . Sexually Active: Not on file   Other Topics Concern  . Not on file   Social History Narrative  . No narrative on file    Past Surgical History  Procedure Date  . Elbow surgery 01/2002    left  . Shoulder surgery 09/2007    injury at work, dislocated  . Incision and drainage perirectal abscess 1998  . Lumbar disc surgery 1992  . Hand surgery 02/2010    basal thumb joint removal and tendon transplant([place])  . Colonoscopy w/  biopsies 06/2001, 08/2006, 07/2008    colon polyp, ulcerative colitis, lymphoid aggregates  . Upper gastrointestinal endoscopy 06/2001    2cm hiatal hernia  . Carpal tunnel release   . Spine surgery     Family History  Problem Relation Age of Onset  . Heart disease Mother     s/p CABG age 73  . Cancer Father     colon , brain  . COPD Sister   . Heart disease Brother     CAD  . Seizures Brother 67    Allergies  Allergen Reactions  . Aspirin     REACTION: unspecified  . Mercaptopurine     REACTION: myalgias  . Procaine Hcl     REACTION: MAKES HEART RACE    Current Outpatient Prescriptions on File Prior to Visit  Medication Sig Dispense Refill  . albuterol (PROAIR HFA) 108 (90 BASE) MCG/ACT inhaler Inhale 2 puffs into the lungs 4 (four) times daily.  2 Inhaler  6  . ALPRAZolam (XANAX) 0.5 MG tablet Take 0.5 tablets (0.25 mg total) by mouth daily. With adderall  50 tablet  2  . amphetamine-dextroamphetamine (ADDERALL XR) 10 MG 24 hr capsule Take 1 capsule (10 mg total) by mouth every morning.  30 capsule  0  . Ascorbic Acid (VITAMIN C) 1000 MG tablet Take 1,000 mg  by mouth daily.        Marland Kitchen buPROPion (WELLBUTRIN SR) 150 MG 12 hr tablet Take 1 tablet (150 mg total) by mouth every morning.  90 tablet  6  . Calcium Citrate-Vitamin D (UPCAL D) 500-250 MG-UNIT PACK Take 2 tablets by mouth.       . fexofenadine-pseudoephedrine (ALLEGRA-D) 60-120 MG per tablet Take 1 tablet by mouth 2 (two) times daily.  60 tablet  2  . gabapentin (NEURONTIN) 300 MG capsule Take 300 mg by mouth 3 (three) times daily.      Marland Kitchen HYDROcodone-acetaminophen (VICODIN) 5-500 MG per tablet Take 1 tablet by mouth every 4 (four) hours as needed for pain.  20 tablet  0  . hyoscyamine (LEVSINEX) 0.375 MG 12 hr capsule Take 1 capsule (0.375 mg total) by mouth daily.  90 capsule  1  . lisinopril (PRINIVIL,ZESTRIL) 20 MG tablet Take 1 tablet (20 mg total) by mouth daily.  90 tablet  11  . Multiple Vitamin (MULTIVITAMIN)  tablet Take 1 tablet by mouth daily.        Marland Kitchen omeprazole (PRILOSEC) 20 MG capsule Take 1 capsule (20 mg total) by mouth daily.  90 capsule  3  . predniSONE (DELTASONE) 1 MG tablet TAKE TWO TABLETS DAILY  60 tablet  1  . simvastatin (ZOCOR) 80 MG tablet Take 1 tablet (80 mg total) by mouth at bedtime.  90 tablet  6  . testosterone (ANDROGEL) 50 MG/5GM GEL Place 5 g onto the skin daily.  30 Package  4  . zolpidem (AMBIEN) 10 MG tablet Take 1 tablet (10 mg total) by mouth at bedtime as needed for sleep.  15 tablet  1    BP 110/74  Pulse 62  Temp 97.8 F (36.6 C) (Oral)  Resp 18  Ht 5' 5.5" (1.664 m)  Wt 141 lb (63.957 kg)  BMI 23.11 kg/m2  SpO2 98%  '      Review of Systems  Constitutional: Negative for fever, chills, activity change, appetite change and fatigue.  HENT: Negative for hearing loss, ear pain, congestion, rhinorrhea, sneezing, mouth sores, trouble swallowing, neck pain, neck stiffness, dental problem, voice change, sinus pressure and tinnitus.   Eyes: Negative for photophobia, pain, redness and visual disturbance.  Respiratory: Negative for apnea, cough, choking, chest tightness, shortness of breath and wheezing.   Cardiovascular: Negative for chest pain, palpitations and leg swelling.  Gastrointestinal: Negative for nausea, vomiting, abdominal pain, diarrhea, constipation, blood in stool, abdominal distention, anal bleeding and rectal pain.  Genitourinary: Negative for dysuria, urgency, frequency, hematuria, flank pain, decreased urine volume, discharge, penile swelling, scrotal swelling, difficulty urinating, genital sores and testicular pain.  Musculoskeletal: Positive for myalgias and arthralgias. Negative for back pain, joint swelling and gait problem.  Skin: Negative for color change, rash and wound.  Neurological: Negative for dizziness, tremors, seizures, syncope, facial asymmetry, speech difficulty, weakness, light-headedness, numbness and headaches.    Hematological: Negative for adenopathy. Does not bruise/bleed easily.  Psychiatric/Behavioral: Negative for suicidal ideas, hallucinations, behavioral problems, confusion, disturbed wake/sleep cycle, self-injury, dysphoric mood, decreased concentration and agitation. The patient is not nervous/anxious.        Objective:   Physical Exam  Constitutional: He appears well-developed and well-nourished.  HENT:  Head: Normocephalic and atraumatic.  Right Ear: External ear normal.  Left Ear: External ear normal.  Nose: Nose normal.  Mouth/Throat: Oropharynx is clear and moist.  Eyes: Conjunctivae and EOM are normal. Pupils are equal, round, and reactive to light. No scleral  icterus.  Neck: Normal range of motion. Neck supple. No JVD present. No thyromegaly present.  Cardiovascular: Regular rhythm, normal heart sounds and intact distal pulses.  Exam reveals no gallop and no friction rub.   No murmur heard. Pulmonary/Chest: Effort normal and breath sounds normal. He exhibits no tenderness.  Abdominal: Soft. Bowel sounds are normal. He exhibits no distension and no mass. There is no tenderness.  Genitourinary: Prostate normal and penis normal. Guaiac negative stool.  Musculoskeletal: Normal range of motion. He exhibits no edema and no tenderness.  Lymphadenopathy:    He has no cervical adenopathy.  Neurological: He is alert. He has normal reflexes. No cranial nerve deficit. Coordination normal.  Skin: Skin is warm and dry. No rash noted.  Psychiatric: He has a normal mood and affect. His behavior is normal.          Assessment & Plan:   Preventive health exam Ulcerative colitis Hypertension stable Dyslipidemia. We'll discontinue simvastatin 80 and switch to atorvastatin 40 Osteoarthritis adhd  Recheck 6 months

## 2012-07-09 NOTE — Patient Instructions (Signed)
Limit your sodium (Salt) intake  Take a calcium supplement, plus 800-1200 units of vitamin D    It is important that you exercise regularly, at least 20 minutes 3 to 4 times per week.  If you develop chest pain or shortness of breath seek  medical attention.  Return in 6 months for follow-up 

## 2012-07-13 ENCOUNTER — Ambulatory Visit (AMBULATORY_SURGERY_CENTER): Payer: Managed Care, Other (non HMO) | Admitting: *Deleted

## 2012-07-13 ENCOUNTER — Encounter: Payer: Self-pay | Admitting: Internal Medicine

## 2012-07-13 VITALS — Ht 65.5 in | Wt 140.0 lb

## 2012-07-13 DIAGNOSIS — K5100 Ulcerative (chronic) pancolitis without complications: Secondary | ICD-10-CM

## 2012-07-13 DIAGNOSIS — Z1211 Encounter for screening for malignant neoplasm of colon: Secondary | ICD-10-CM

## 2012-07-13 MED ORDER — MOVIPREP 100 G PO SOLR: ORAL | Status: DC

## 2012-07-21 ENCOUNTER — Ambulatory Visit (AMBULATORY_SURGERY_CENTER): Payer: Managed Care, Other (non HMO) | Admitting: Internal Medicine

## 2012-07-21 ENCOUNTER — Encounter: Payer: Self-pay | Admitting: Internal Medicine

## 2012-07-21 ENCOUNTER — Telehealth: Payer: Self-pay | Admitting: Internal Medicine

## 2012-07-21 VITALS — BP 139/69 | HR 71 | Temp 96.1°F | Resp 21 | Ht 65.0 in | Wt 140.0 lb

## 2012-07-21 DIAGNOSIS — K5289 Other specified noninfective gastroenteritis and colitis: Secondary | ICD-10-CM

## 2012-07-21 DIAGNOSIS — Z1211 Encounter for screening for malignant neoplasm of colon: Secondary | ICD-10-CM

## 2012-07-21 DIAGNOSIS — K5100 Ulcerative (chronic) pancolitis without complications: Secondary | ICD-10-CM

## 2012-07-21 DIAGNOSIS — K644 Residual hemorrhoidal skin tags: Secondary | ICD-10-CM

## 2012-07-21 DIAGNOSIS — D126 Benign neoplasm of colon, unspecified: Secondary | ICD-10-CM

## 2012-07-21 MED ORDER — SODIUM CHLORIDE 0.9 % IV SOLN: 500.0000 mL | INTRAVENOUS | Status: DC

## 2012-07-21 NOTE — Progress Notes (Signed)
The pt tolerated the colonoscopy very well. Maw   

## 2012-07-21 NOTE — Telephone Encounter (Signed)
Caller: Carolyn/Spouse; PCP: Bluford Kaufmann; CB#: [phone]; ; ; Call regarding Medication Question;  07-21-12 she said she got him home from colonoscopy and the discharge sheet of medications has a med on that she doesn't think he is on, it is Neurotin.  I looked in Epic and at his visit on 07-09-12 he was started on that.  I told her that and told her where it was sent in because he has not started it yet.  Also told her what it is for.

## 2012-07-21 NOTE — Op Note (Signed)
[facility] [place], [place]  [address]  COLONOSCOPY PROCEDURE REPORT  PATIENT:  Joseph Mejia, Joseph Mejia  MR#:  [id number] BIRTHDATE:  31-Mar-1946, 52 yrs. old  GENDER:  male ENDOSCOPIST:  Gatha Mayer, MD, [facility]  PROCEDURE DATE:  07/21/2012 PROCEDURE:  Colonoscopy with biopsy ASA CLASS:  Class II INDICATIONS:  surveillance and high-risk screening, evaluation of ulcerative colitis MEDICATIONS:   These medications were titrated to patient response per physician's verbal order, MAC sedation, administered by CRNA, propofol (Diprivan) 320 mg IV  DESCRIPTION OF PROCEDURE:   After the risks benefits and alternatives of the procedure were thoroughly explained, informed consent was obtained.  Digital rectal exam was performed and revealed no abnormalities and normal prostate.   The LB [id number] endoscope was introduced through the anus and advanced to the cecum, which was identified by both the appendix and ileocecal valve, without limitations.  The quality of the prep was excellent, using MoviPrep.  The instrument was then slowly withdrawn as the colon was fully examined. <<PROCEDUREIMAGES>>  FINDINGS:  A normal appearing cecum, ileocecal valve, and appendiceal orifice were identified. The ascending, hepatic flexure, transverse, splenic flexure, descending, sigmoid colon, and rectum appeared unremarkable.  Multiple biopsies were obtained in ulcerative colitis surveillance protocol fashion and sent to pathology.   Retroflexed views in the rectum revealed internal and external hemorrhoids.  They were small.  The time to cecum = 2:04 minutes. The scope was then withdrawn in 14:34 minutes from the cecum and the procedure completed. COMPLICATIONS:  None ENDOSCOPIC IMPRESSION: 1) Normal colon - ulcerative colitis appears to be in remission  2) Internal and external hemorrhoids - small  REPEAT EXAM:  In for Colonoscopy, pending biopsy results.  Gatha Mayer, MD, [facility]  CC:  The Patient  n. eSIGNED:   Gatha Mayer at 07/21/2012 09:41 AM  Marliss Coots, [phone]

## 2012-07-21 NOTE — Progress Notes (Signed)
Patient did not experience any of the following events: a burn prior to discharge; a fall within the facility; wrong site/side/patient/procedure/implant event; or a hospital transfer or hospital admission upon discharge from the facility. (G8907) Patient did not have preoperative order for IV antibiotic SSI prophylaxis. (G8918)  

## 2012-07-21 NOTE — Patient Instructions (Addendum)
The colon looks ok - I believe the colitis remains in remission. I will send you a letter about the results.  Thank you for choosing me and [facility].  Gatha Mayer, MD, [facility]  Discharge instructions given with verbal understanding.  Handouts on hemorrhoids and a high fiber diet given.  Resume previous medications.  YOU HAD AN ENDOSCOPIC PROCEDURE TODAY AT [facility]: Refer to the procedure report that was given to you for any specific questions about what was found during the examination.  If the procedure report does not answer your questions, please call your gastroenterologist to clarify.  If you requested that your care partner not be given the details of your procedure findings, then the procedure report has been included in a sealed envelope for you to review at your convenience later.  YOU SHOULD EXPECT: Some feelings of bloating in the abdomen. Passage of more gas than usual.  Walking can help get rid of the air that was put into your GI tract during the procedure and reduce the bloating. If you had a lower endoscopy (such as a colonoscopy or flexible sigmoidoscopy) you may notice spotting of blood in your stool or on the toilet paper. If you underwent a bowel prep for your procedure, then you may not have a normal bowel movement for a few days.  DIET: Your first meal following the procedure should be a light meal and then it is ok to progress to your normal diet.  A half-sandwich or bowl of soup is an example of a good first meal.  Heavy or fried foods are harder to digest and may make you feel nauseous or bloated.  Likewise meals heavy in dairy and vegetables can cause extra gas to form and this can also increase the bloating.  Drink plenty of fluids but you should avoid alcoholic beverages for 24 hours.  ACTIVITY: Your care partner should take you home directly after the procedure.  You should plan to take it easy, moving slowly for the rest of the  day.  You can resume normal activity the day after the procedure however you should NOT DRIVE or use heavy machinery for 24 hours (because of the sedation medicines used during the test).    SYMPTOMS TO REPORT IMMEDIATELY: A gastroenterologist can be reached at any hour.  During normal business hours, 8:30 AM to 5:00 PM Monday through Friday, call [phone].  After hours and on weekends, please call the GI answering service at [phone] who will take a message and have the physician on call contact you.   Following lower endoscopy (colonoscopy or flexible sigmoidoscopy):  Excessive amounts of blood in the stool  Significant tenderness or worsening of abdominal pains  Swelling of the abdomen that is new, acute  Fever of 100F or higher  FOLLOW UP: If any biopsies were taken you will be contacted by phone or by letter within the next 1-3 weeks.  Call your gastroenterologist if you have not heard about the biopsies in 3 weeks.  Our staff will call the home number listed on your records the next business day following your procedure to check on you and address any questions or concerns that you may have at that time regarding the information given to you following your procedure. This is a courtesy call and so if there is no answer at the home number and we have not heard from you through the emergency physician on call, we will assume that you have  returned to your regular daily activities without incident.  SIGNATURES/CONFIDENTIALITY: You and/or your care partner have signed paperwork which will be entered into your electronic medical record.  These signatures attest to the fact that that the information above on your After Visit Summary has been reviewed and is understood.  Full responsibility of the confidentiality of this discharge information lies with you and/or your care-partner.

## 2012-07-22 ENCOUNTER — Telehealth: Payer: Self-pay | Admitting: *Deleted

## 2012-07-22 NOTE — Telephone Encounter (Signed)
  Follow up Call-  Call back number 07/21/2012  Post procedure Call Back phone  # [phone]  Permission to leave phone message Yes   Pt had left for work; spoke with his wife  Patient questions:  Do you have a fever, pain , or abdominal swelling? no Pain Score  0 *  Have you tolerated food without any problems? yes  Have you been able to return to your normal activities? yes  Do you have any questions about your discharge instructions: Diet   no Medications  no Follow up visit  no  Do you have questions or concerns about your Care? no  Actions: * If pain score is 4 or above: No action needed, pain <4.

## 2012-07-29 ENCOUNTER — Other Ambulatory Visit: Payer: Self-pay

## 2012-07-29 ENCOUNTER — Encounter: Payer: Self-pay | Admitting: Internal Medicine

## 2012-07-29 DIAGNOSIS — IMO0002 Reserved for concepts with insufficient information to code with codable children: Secondary | ICD-10-CM

## 2012-07-29 DIAGNOSIS — M8580 Other specified disorders of bone density and structure, unspecified site: Secondary | ICD-10-CM

## 2012-08-03 ENCOUNTER — Other Ambulatory Visit: Payer: Self-pay

## 2012-08-03 MED ORDER — BUPROPION HCL ER (SR) 150 MG PO TB12: 150.0000 mg | ORAL_TABLET | ORAL | Status: DC

## 2012-08-04 ENCOUNTER — Other Ambulatory Visit: Payer: Managed Care, Other (non HMO)

## 2012-08-06 ENCOUNTER — Telehealth: Payer: Self-pay | Admitting: Internal Medicine

## 2012-08-06 NOTE — Telephone Encounter (Signed)
appt cancelled

## 2012-08-10 ENCOUNTER — Inpatient Hospital Stay: Admission: RE | Admit: 2012-08-10 | Payer: Managed Care, Other (non HMO) | Source: Ambulatory Visit

## 2012-08-18 ENCOUNTER — Other Ambulatory Visit: Payer: Self-pay | Admitting: Internal Medicine

## 2012-08-18 DIAGNOSIS — F988 Other specified behavioral and emotional disorders with onset usually occurring in childhood and adolescence: Secondary | ICD-10-CM

## 2012-08-18 MED ORDER — AMPHETAMINE-DEXTROAMPHET ER 10 MG PO CP24: 10.0000 mg | ORAL_CAPSULE | ORAL | Status: DC

## 2012-08-18 NOTE — Telephone Encounter (Signed)
Patient's spouse called stating that he need his 3 rxs for his adderall and would like a call or message when available for pick up. Please assist.

## 2012-08-18 NOTE — Telephone Encounter (Signed)
Wife aware rx's ready for pick up

## 2012-09-09 ENCOUNTER — Telehealth: Payer: Self-pay | Admitting: Internal Medicine

## 2012-09-09 NOTE — Telephone Encounter (Signed)
Called pt to make aware that dexa scan was cancelled.  Number busy; will attempt to call at a later time.

## 2012-09-09 NOTE — Telephone Encounter (Signed)
Caller: Carol/Spouse; Phone: [phone]; Reason for Call: Wife calling regarding a bone density test, she states she thought Dr.  Burnice Logan wanted the patient to have that done but not sure.  She's calling to see if someone could check on that and if Dr.  Burnice Logan did want the patient to have the bone density test then they would like to set up appt.  Please call her back.  Thanks

## 2012-09-09 NOTE — Telephone Encounter (Signed)
Called and [facility] for wife to call back. Pt called GI and canceled his dexa scan because he did not want to do it at this time.

## 2012-09-10 ENCOUNTER — Telehealth: Payer: Self-pay | Admitting: Internal Medicine

## 2012-09-10 NOTE — Telephone Encounter (Signed)
Forward to dr. Amador Cunas to informed

## 2012-09-10 NOTE — Telephone Encounter (Signed)
Joseph Mejia, please see documentation on this matter from yesterday and advise. Thanks!

## 2012-09-10 NOTE — Telephone Encounter (Signed)
original note on dr. Amador Cunas to informed and advise

## 2012-09-10 NOTE — Telephone Encounter (Signed)
Caller: Carolyn/Spouse; Patient Name: Joseph Mejia; PCP: Bluford Kaufmann [facility]); Best Callback Phone Number: [phone] Hoyle Sauer calling regarding patient needs a dexa scan, wife did not know he had one scheduled and cancelled, but he needs this done, please call Hoyle Sauer and let her know when this can be done and her prefers in the am.

## 2012-09-15 ENCOUNTER — Other Ambulatory Visit: Payer: Self-pay

## 2012-09-15 MED ORDER — HYOSCYAMINE SULFATE CR 0.375 MG PO CP12: 0.3750 mg | ORAL_CAPSULE | Freq: Every day | ORAL | Status: DC

## 2012-09-21 ENCOUNTER — Other Ambulatory Visit: Payer: Self-pay

## 2012-09-21 MED ORDER — HYOSCYAMINE SULFATE ER 0.375 MG PO TB12: 0.3750 mg | ORAL_TABLET | Freq: Two times a day (BID) | ORAL | Status: DC | PRN

## 2012-09-21 NOTE — Telephone Encounter (Signed)
Change from cap to tab - faxed to mail order phamracy

## 2012-10-07 ENCOUNTER — Other Ambulatory Visit: Payer: Self-pay | Admitting: Internal Medicine

## 2012-11-02 ENCOUNTER — Ambulatory Visit (INDEPENDENT_AMBULATORY_CARE_PROVIDER_SITE_OTHER): Payer: Managed Care, Other (non HMO) | Admitting: Psychology

## 2012-11-02 DIAGNOSIS — F331 Major depressive disorder, recurrent, moderate: Secondary | ICD-10-CM

## 2012-11-02 DIAGNOSIS — F411 Generalized anxiety disorder: Secondary | ICD-10-CM

## 2012-11-03 ENCOUNTER — Telehealth: Payer: Self-pay | Admitting: Internal Medicine

## 2012-11-03 NOTE — Telephone Encounter (Signed)
Caller: Carolyn/Spouse; Phone: [phone]; Reason for Call: Patient needs a copy of medical records for bloodwork from physicals performed in 2012 and 2013.  Please call back.

## 2012-11-09 ENCOUNTER — Other Ambulatory Visit: Payer: Self-pay | Admitting: Internal Medicine

## 2012-11-09 NOTE — Telephone Encounter (Signed)
Med not refilled. Requested to soon.

## 2012-11-10 NOTE — Telephone Encounter (Signed)
Left message for pt to come and fill out a release form.

## 2012-11-11 ENCOUNTER — Ambulatory Visit (INDEPENDENT_AMBULATORY_CARE_PROVIDER_SITE_OTHER): Payer: Managed Care, Other (non HMO) | Admitting: Psychology

## 2012-11-11 ENCOUNTER — Telehealth: Payer: Self-pay | Admitting: Internal Medicine

## 2012-11-11 DIAGNOSIS — F331 Major depressive disorder, recurrent, moderate: Secondary | ICD-10-CM

## 2012-11-11 DIAGNOSIS — F411 Generalized anxiety disorder: Secondary | ICD-10-CM

## 2012-11-11 NOTE — Telephone Encounter (Signed)
Left message for patient to call back  

## 2012-11-12 NOTE — Telephone Encounter (Signed)
Patient is rescheduled for 11/17/12 10:30

## 2012-11-17 ENCOUNTER — Ambulatory Visit (INDEPENDENT_AMBULATORY_CARE_PROVIDER_SITE_OTHER)
Admission: RE | Admit: 2012-11-17 | Discharge: 2012-11-17 | Disposition: A | Discharge status: Home / Self Care | Payer: Managed Care, Other (non HMO) | Source: Ambulatory Visit | Attending: Internal Medicine | Admitting: Internal Medicine

## 2012-11-17 DIAGNOSIS — M8580 Other specified disorders of bone density and structure, unspecified site: Secondary | ICD-10-CM

## 2012-11-17 DIAGNOSIS — M949 Disorder of cartilage, unspecified: Secondary | ICD-10-CM

## 2012-11-17 DIAGNOSIS — M899 Disorder of bone, unspecified: Secondary | ICD-10-CM

## 2012-11-17 DIAGNOSIS — IMO0002 Reserved for concepts with insufficient information to code with codable children: Secondary | ICD-10-CM

## 2012-11-19 ENCOUNTER — Ambulatory Visit (INDEPENDENT_AMBULATORY_CARE_PROVIDER_SITE_OTHER): Payer: Managed Care, Other (non HMO) | Admitting: Psychology

## 2012-11-19 DIAGNOSIS — F411 Generalized anxiety disorder: Secondary | ICD-10-CM

## 2012-11-19 DIAGNOSIS — F331 Major depressive disorder, recurrent, moderate: Secondary | ICD-10-CM

## 2012-11-23 ENCOUNTER — Other Ambulatory Visit: Payer: Self-pay | Admitting: Internal Medicine

## 2012-11-23 DIAGNOSIS — F988 Other specified behavioral and emotional disorders with onset usually occurring in childhood and adolescence: Secondary | ICD-10-CM

## 2012-11-23 MED ORDER — AMPHETAMINE-DEXTROAMPHET ER 10 MG PO CP24: 10.0000 mg | ORAL_CAPSULE | ORAL | Status: DC

## 2012-11-23 NOTE — Telephone Encounter (Signed)
ok 

## 2012-11-23 NOTE — Telephone Encounter (Signed)
Pts spouse called and said that pt needs refill of amphetamine-dextroamphetamine (ADDERALL XR) 10 MG 24 hr capsule 3 month supply.

## 2012-11-24 MED ORDER — ALPRAZOLAM 0.5 MG PO TABS: 0.5000 mg | ORAL_TABLET | Freq: Every day | ORAL | Status: DC

## 2012-11-24 NOTE — Telephone Encounter (Signed)
Rx called into pharmacy. Adderall Rx ready for pickup.

## 2012-11-24 NOTE — Telephone Encounter (Signed)
Left message on voicemail. Rx for Xanax was called into pharmacy and Adderall Rx is ready for pickup.

## 2012-11-30 ENCOUNTER — Ambulatory Visit (INDEPENDENT_AMBULATORY_CARE_PROVIDER_SITE_OTHER): Payer: Managed Care, Other (non HMO) | Admitting: Psychology

## 2012-11-30 DIAGNOSIS — F331 Major depressive disorder, recurrent, moderate: Secondary | ICD-10-CM

## 2012-11-30 DIAGNOSIS — F411 Generalized anxiety disorder: Secondary | ICD-10-CM

## 2012-12-03 ENCOUNTER — Other Ambulatory Visit: Payer: Self-pay | Admitting: Internal Medicine

## 2012-12-07 ENCOUNTER — Telehealth: Payer: Self-pay | Admitting: Internal Medicine

## 2012-12-07 ENCOUNTER — Other Ambulatory Visit: Payer: Self-pay | Admitting: Internal Medicine

## 2012-12-07 NOTE — Telephone Encounter (Signed)
Patient had Dexa in November. Will you please look at the results and advise.

## 2012-12-08 ENCOUNTER — Encounter: Payer: Self-pay | Admitting: Internal Medicine

## 2012-12-08 ENCOUNTER — Ambulatory Visit (INDEPENDENT_AMBULATORY_CARE_PROVIDER_SITE_OTHER): Payer: Managed Care, Other (non HMO) | Admitting: Internal Medicine

## 2012-12-08 VITALS — BP 118/70 | HR 92 | Temp 97.9°F | Resp 18 | Wt 144.0 lb

## 2012-12-08 DIAGNOSIS — E785 Hyperlipidemia, unspecified: Secondary | ICD-10-CM

## 2012-12-08 DIAGNOSIS — F3289 Other specified depressive episodes: Secondary | ICD-10-CM

## 2012-12-08 DIAGNOSIS — F988 Other specified behavioral and emotional disorders with onset usually occurring in childhood and adolescence: Secondary | ICD-10-CM

## 2012-12-08 DIAGNOSIS — I10 Essential (primary) hypertension: Secondary | ICD-10-CM

## 2012-12-08 DIAGNOSIS — J45909 Unspecified asthma, uncomplicated: Secondary | ICD-10-CM

## 2012-12-08 DIAGNOSIS — F329 Major depressive disorder, single episode, unspecified: Secondary | ICD-10-CM

## 2012-12-08 MED ORDER — ALBUTEROL SULFATE HFA 108 (90 BASE) MCG/ACT IN AERS: 2.0000 | INHALATION_SPRAY | Freq: Four times a day (QID) | RESPIRATORY_TRACT | Status: DC

## 2012-12-08 MED ORDER — HYOSCYAMINE SULFATE ER 0.375 MG PO TB12: 0.3750 mg | ORAL_TABLET | Freq: Two times a day (BID) | ORAL | Status: DC | PRN

## 2012-12-08 MED ORDER — LISINOPRIL 10 MG PO TABS: 10.0000 mg | ORAL_TABLET | Freq: Every day | ORAL | Status: DC

## 2012-12-08 MED ORDER — AMPHETAMINE-DEXTROAMPHET ER 10 MG PO CP24: 10.0000 mg | ORAL_CAPSULE | ORAL | Status: DC

## 2012-12-08 MED ORDER — TESTOSTERONE 50 MG/5GM (1%) TD GEL: 5.0000 g | Freq: Every day | TRANSDERMAL | Status: DC

## 2012-12-08 MED ORDER — ZOLPIDEM TARTRATE 10 MG PO TABS: 10.0000 mg | ORAL_TABLET | Freq: Every evening | ORAL | Status: DC | PRN

## 2012-12-08 MED ORDER — BUPROPION HCL ER (SR) 150 MG PO TB12: 150.0000 mg | ORAL_TABLET | ORAL | Status: DC

## 2012-12-08 MED ORDER — ATORVASTATIN CALCIUM 40 MG PO TABS: 40.0000 mg | ORAL_TABLET | Freq: Every day | ORAL | Status: DC

## 2012-12-08 MED ORDER — GABAPENTIN 300 MG PO CAPS: 300.0000 mg | ORAL_CAPSULE | Freq: Three times a day (TID) | ORAL | Status: DC

## 2012-12-08 MED ORDER — ALPRAZOLAM 0.5 MG PO TABS: 0.5000 mg | ORAL_TABLET | Freq: Every day | ORAL | Status: DC

## 2012-12-08 MED ORDER — OMEPRAZOLE 20 MG PO CPDR: 20.0000 mg | DELAYED_RELEASE_CAPSULE | Freq: Every day | ORAL | Status: DC

## 2012-12-08 NOTE — Progress Notes (Signed)
Subjective:    Patient ID: Joseph Mejia, male    DOB: 1946-07-11, 66 y.o.   MRN: [id number]  HPI  66 year old patient who is seen today for followup. He has a history of ulcerative colitis and also of anxiety depression. He has hypertension ADD and a history of asthma. He has osteoarthritis. He currently will be having surgery do to a right ulnar compressive neuropathy. Remains on low dose prednisone. Has had a recent bone density study. He remains on testosterone replacement for testosterone deficiency. In general doing quite well. No new concerns or complaints He is followed by multiple consultants  Past Medical History  Diagnosis Date  . HYPERLIPIDEMIA 08/03/2007  . ANXIETY 08/03/2007  . DEPRESSION 08/03/2007  . ADD 02/23/2008  . HYPERTENSION 08/03/2007  . ASTHMA 08/03/2007  . GERD 08/03/2007  . UNIVERSAL ULCERATIVE COLITIS 01/26/2009  . Irritable bowel syndrome 11/02/2008  . RENAL DISEASE, CHRONIC, MILD 11/04/2008  . ERECTILE DYSFUNCTION, ORGANIC 02/23/2008  . OSTEOARTHRITIS 11/02/2008  . HIP PAIN, RIGHT 05/27/2008  . HAND PAIN, LEFT 06/20/2009  . OSTEOPENIA 07/26/2008  . CHEST PAIN 11/17/2008  . Closed fracture of four ribs 06/30/2008  . Failed moderate sedation during procedure 11/02/2008    History   Social History  . Marital Status: Married    Spouse Name: N/A    Number of Children: N/A  . Years of Education: N/A   Occupational History  . Not on file.   Social History Main Topics  . Smoking status: Never Smoker   . Smokeless tobacco: Never Used  . Alcohol Use: 4.2 oz/week    7 Glasses of wine per week  . Drug Use: No  . Sexually Active: Not on file   Other Topics Concern  . Not on file   Social History Narrative  . No narrative on file    Past Surgical History  Procedure Date  . Elbow surgery 01/2002    left  . Shoulder surgery 09/2007    injury at work, dislocated  . Incision and drainage perirectal abscess 1998  . Lumbar disc surgery 1992  . Hand surgery 02/2010     basal thumb joint removal and tendon transplant([facility])  . Colonoscopy w/ biopsies 06/2001, 08/2006, 07/2008    colon polyp, ulcerative colitis, lymphoid aggregates  . Upper gastrointestinal endoscopy 06/2001    2cm hiatal hernia  . Carpal tunnel release 2012  . Anterior fusion cervical spine 2012    Family History  Problem Relation Age of Onset  . Heart disease Mother     s/p CABG age 65  . Cancer Father     colon , brain  . Colon cancer Father   . COPD Sister   . Heart disease Brother     CAD  . Seizures Brother 67    Allergies  Allergen Reactions  . Aspirin     REACTION: unspecified  . Mercaptopurine     REACTION: myalgias  . Procaine Hcl     REACTION: MAKES HEART RACE    Current Outpatient Prescriptions on File Prior to Visit  Medication Sig Dispense Refill  . amphetamine-dextroamphetamine (ADDERALL XR) 10 MG 24 hr capsule Take 1 capsule (10 mg total) by mouth every morning.  30 capsule  0  . amphetamine-dextroamphetamine (ADDERALL XR) 10 MG 24 hr capsule Take 1 capsule (10 mg total) by mouth every morning.  30 capsule  0  . Ascorbic Acid (VITAMIN C) 1000 MG tablet Take 1,000 mg by mouth daily.        Marland Kitchen  Calcium Citrate-Vitamin D (UPCAL D) 500-250 MG-UNIT PACK Take 2 tablets by mouth.       . fexofenadine-pseudoephedrine (ALLEGRA-D) 60-120 MG per tablet Take 1 tablet by mouth 2 (two) times daily.  60 tablet  2  . HYDROcodone-acetaminophen (VICODIN) 5-500 MG per tablet Take 1 tablet by mouth every 4 (four) hours as needed for pain.  20 tablet  0  . Multiple Vitamin (MULTIVITAMIN) tablet Take 1 tablet by mouth daily.        . predniSONE (DELTASONE) 1 MG tablet TAKE TWO TABLETS DAILY  60 tablet  3  . [DISCONTINUED] albuterol (PROAIR HFA) 108 (90 BASE) MCG/ACT inhaler Inhale 2 puffs into the lungs 4 (four) times daily.  2 Inhaler  6  . [DISCONTINUED] amphetamine-dextroamphetamine (ADDERALL XR) 10 MG 24 hr capsule Take 1 capsule (10 mg total) by mouth every morning.  30  capsule  0  . [DISCONTINUED] atorvastatin (LIPITOR) 40 MG tablet Take 1 tablet (40 mg total) by mouth daily.  90 tablet  3  . [DISCONTINUED] buPROPion (WELLBUTRIN SR) 150 MG 12 hr tablet Take 1 tablet (150 mg total) by mouth every morning.  90 tablet  2  . [DISCONTINUED] gabapentin (NEURONTIN) 300 MG capsule Take 1 capsule (300 mg total) by mouth 3 (three) times daily.  270 capsule  4  . [DISCONTINUED] hyoscyamine (LEVBID) 0.375 MG 12 hr tablet Take 1 tablet (0.375 mg total) by mouth every 12 (twelve) hours as needed for cramping.  90 tablet  2  . [DISCONTINUED] omeprazole (PRILOSEC) 20 MG capsule Take 1 capsule (20 mg total) by mouth daily.  90 capsule  3  . [DISCONTINUED] testosterone (ANDROGEL) 50 MG/5GM GEL Place 5 g onto the skin daily.  30 Package  4  . [DISCONTINUED] zolpidem (AMBIEN) 10 MG tablet Take 1 tablet (10 mg total) by mouth at bedtime as needed for sleep.  30 tablet  3    BP 118/70  Pulse 92  Temp 97.9 F (36.6 C) (Oral)  Resp 18  Wt 144 lb (65.318 kg)  SpO2 96%     BP Readings from Last 3 Encounters:  12/08/12 118/70  07/21/12 139/69  07/09/12 110/74    Review of Systems  Constitutional: Negative for fever, chills, appetite change and fatigue.  HENT: Negative for hearing loss, ear pain, congestion, sore throat, trouble swallowing, neck stiffness, dental problem, voice change and tinnitus.   Eyes: Negative for pain, discharge and visual disturbance.  Respiratory: Negative for cough, chest tightness, wheezing and stridor.   Cardiovascular: Negative for chest pain, palpitations and leg swelling.  Gastrointestinal: Negative for nausea, vomiting, abdominal pain, diarrhea, constipation, blood in stool and abdominal distention.  Genitourinary: Negative for urgency, hematuria, flank pain, discharge, difficulty urinating and genital sores.  Musculoskeletal: Positive for back pain and arthralgias. Negative for myalgias, joint swelling and gait problem.  Skin: Negative for  rash.  Neurological: Negative for dizziness, syncope, speech difficulty, weakness, numbness and headaches.  Hematological: Negative for adenopathy. Does not bruise/bleed easily.  Psychiatric/Behavioral: Negative for behavioral problems and dysphoric mood. The patient is not nervous/anxious.        Objective:   Physical Exam  Constitutional: He is oriented to person, place, and time. He appears well-developed.  HENT:  Head: Normocephalic.  Right Ear: External ear normal.  Left Ear: External ear normal.  Eyes: Conjunctivae normal and EOM are normal.  Neck: Normal range of motion.  Cardiovascular: Normal rate and normal heart sounds.   Pulmonary/Chest: Breath sounds normal.  Abdominal: Bowel  sounds are normal.  Musculoskeletal: Normal range of motion. He exhibits no edema and no tenderness.  Neurological: He is alert and oriented to person, place, and time.  Psychiatric: He has a normal mood and affect. His behavior is normal.          Assessment & Plan:   Hypertension. Blood pressure low normal. We'll down titrate the lisinopril to 10 mg daily Ulcerative colitis Osteoarthritis A DD. Adderall refilled Asthma stable  Recheck 6 months All meds refilled

## 2012-12-08 NOTE — Patient Instructions (Signed)
Limit your sodium (Salt) intake    It is important that you exercise regularly, at least 20 minutes 3 to 4 times per week.  If you develop chest pain or shortness of breath seek  medical attention.  Return in 6 months for follow-up  

## 2012-12-09 ENCOUNTER — Ambulatory Visit: Payer: Managed Care, Other (non HMO) | Admitting: Internal Medicine

## 2012-12-11 NOTE — Progress Notes (Signed)
Quick Note:  Still has osteopenia - in between normal and osteoporosis He needs to be on 2000 iu vit D daily and if no vit D level in last year lets check one  I will let him know if anything else needs to be done after I check into current therapy again ______

## 2012-12-14 ENCOUNTER — Encounter: Payer: Self-pay | Admitting: Internal Medicine

## 2012-12-14 ENCOUNTER — Other Ambulatory Visit: Payer: Self-pay

## 2012-12-14 DIAGNOSIS — M8580 Other specified disorders of bone density and structure, unspecified site: Secondary | ICD-10-CM

## 2012-12-14 DIAGNOSIS — IMO0002 Reserved for concepts with insufficient information to code with codable children: Secondary | ICD-10-CM

## 2012-12-14 NOTE — Progress Notes (Signed)
Quick Note:  Also needs a recall for DEXA in 1 year - can do an OV recall if needed ______

## 2012-12-14 NOTE — Progress Notes (Signed)
Quick Note:  Have him get a 25 OH3 vitamin D level dx - osteopenia and chronic steroid use ______

## 2012-12-17 ENCOUNTER — Telehealth: Payer: Self-pay | Admitting: Internal Medicine

## 2012-12-17 NOTE — Telephone Encounter (Signed)
Patient Information:  Caller Name: Hoyle Sauer  Phone: [phone]  Patient: Joseph Mejia  Gender: Male  DOB: 08-22-1946  Age: 66 Years  PCP: Bluford Kaufmann [facility])  Office Follow Up:  Does the office need to follow up with this patient?: No  Instructions For The Office: N/A  RN Note:  Will try home treatment and if not better will contact office for appt/evaluation  Symptoms  Reason For Call & Symptoms: Wife states onset of Headache for three days along with sneezing, congestion, no drainage. No fever.  Treated with Allegra..  Reviewed Health History In EMR: Yes  Reviewed Medications In EMR: Yes  Reviewed Allergies In EMR: Yes  Reviewed Surgeries / Procedures: No  Date of Onset of Symptoms: 12/14/2012  Treatments Tried: Allegra, tylenol for headache  Treatments Tried Worked: Yes  Guideline(s) Used:  Sinus Pain and Congestion  Disposition Per Guideline:   Home Care  Reason For Disposition Reached:   Sinus congestion as part of a cold, present < 10 days  Advice Given:  Reassurance:   Usually home treatment with nasal washes can prevent an actual bacterial sinus infection.  For a Runny Nose With Profuse Discharge:  Nasal mucus and discharge helps to wash viruses and bacteria out of the nose and sinuses.  Blowing the nose is all that is needed.  For a Stuffy Nose - Use Nasal Washes:  Introduction: Saline (salt water) nasal irrigation (nasal wash) is an effective and simple home remedy for treating stuffy nose and sinus congestion. The nose can be irrigated by pouring, spraying, or squirting salt water into the nose and then letting it run back out.  How it Helps: The salt water rinses out excess mucus, washes out any irritants (dust, allergens) that might be present, and moistens the nasal cavity.  Methods: There are several ways to perform nasal irrigation. You can use a saline nasal spray bottle (available over-the-counter), a rubber ear syringe, a medical  syringe without the needle, or a Neti Pot.  Caution - Nasal Decongestants:  Do not take these medications if you have high blood pressure, heart disease, prostate problems, or an overactive thyroid.  Pain and Fever Medicines:  For pain or fever relief, take either acetaminophen or ibuprofen.  They are over-the-counter (OTC) drugs that help treat both fever and pain. You can buy them at the drugstore.  Hydration:  Drink plenty of liquids (6-8 glasses of water daily). If the air in your home is dry, use a cool mist humidifier  Hydration:  Drink plenty of liquids (6-8 glasses of water daily). If the air in your home is dry, use a cool mist humidifier  Call Back If:   Severe pain lasts longer than 2 hours after pain medicine  Sinus pain lasts longer than 1 day after starting treatment using nasal washes  Sinus congestion (fullness) lasts longer than 10 days  Fever lasts longer than 3 days  You become worse.

## 2012-12-18 ENCOUNTER — Ambulatory Visit (INDEPENDENT_AMBULATORY_CARE_PROVIDER_SITE_OTHER): Payer: Managed Care, Other (non HMO) | Admitting: Psychology

## 2012-12-18 DIAGNOSIS — F411 Generalized anxiety disorder: Secondary | ICD-10-CM

## 2012-12-18 DIAGNOSIS — F331 Major depressive disorder, recurrent, moderate: Secondary | ICD-10-CM

## 2013-01-06 ENCOUNTER — Other Ambulatory Visit: Payer: Managed Care, Other (non HMO)

## 2013-01-06 DIAGNOSIS — M8580 Other specified disorders of bone density and structure, unspecified site: Secondary | ICD-10-CM

## 2013-01-06 DIAGNOSIS — IMO0002 Reserved for concepts with insufficient information to code with codable children: Secondary | ICD-10-CM

## 2013-01-08 ENCOUNTER — Telehealth: Payer: Self-pay | Admitting: Internal Medicine

## 2013-01-08 NOTE — Telephone Encounter (Signed)
Patient's wife advised that the labs are still pending and we will call with the results when available

## 2013-01-09 LAB — VITAMIN D 1,25 DIHYDROXY
Vitamin D 1, 25 (OH)2 Total: 39 pg/mL (ref 18–72)
Vitamin D2 1, 25 (OH)2: <8 pg/mL
Vitamin D3 1, 25 (OH)2: 39 pg/mL

## 2013-01-12 ENCOUNTER — Encounter: Payer: Self-pay | Admitting: Internal Medicine

## 2013-01-12 NOTE — Progress Notes (Signed)
Quick Note:  This level is ok  Based upon his DEXA and other hx - his 10 year risk for osteooporosis ios 11% and hip fracture is 2.6%  The recommendations are for Calcium and vitamin D supplementation  1) Calcium 1200 mg daily - would use TUMS to get close to this # 2) Vitamin D 1000 IU daily (change from earlier recommendation of 2000)  ______

## 2013-04-22 ENCOUNTER — Other Ambulatory Visit: Payer: Self-pay | Admitting: Internal Medicine

## 2013-04-23 ENCOUNTER — Other Ambulatory Visit: Payer: Self-pay | Admitting: Internal Medicine

## 2013-05-10 ENCOUNTER — Other Ambulatory Visit: Payer: Self-pay | Admitting: Internal Medicine

## 2013-05-12 ENCOUNTER — Encounter: Payer: Self-pay | Admitting: Internal Medicine

## 2013-05-12 ENCOUNTER — Ambulatory Visit (INDEPENDENT_AMBULATORY_CARE_PROVIDER_SITE_OTHER): Payer: Managed Care, Other (non HMO) | Admitting: Internal Medicine

## 2013-05-12 VITALS — BP 122/60 | HR 58 | Temp 98.2°F | Resp 20 | Wt 141.0 lb

## 2013-05-12 DIAGNOSIS — I10 Essential (primary) hypertension: Secondary | ICD-10-CM

## 2013-05-12 DIAGNOSIS — F988 Other specified behavioral and emotional disorders with onset usually occurring in childhood and adolescence: Secondary | ICD-10-CM

## 2013-05-12 DIAGNOSIS — J45909 Unspecified asthma, uncomplicated: Secondary | ICD-10-CM

## 2013-05-12 DIAGNOSIS — K5100 Ulcerative (chronic) pancolitis without complications: Secondary | ICD-10-CM

## 2013-05-12 MED ORDER — ZOLPIDEM TARTRATE 10 MG PO TABS: ORAL_TABLET | ORAL | Status: DC

## 2013-05-12 MED ORDER — AMPHETAMINE-DEXTROAMPHET ER 10 MG PO CP24: 10.0000 mg | ORAL_CAPSULE | ORAL | Status: DC

## 2013-05-12 MED ORDER — ALPRAZOLAM 0.5 MG PO TABS: 0.5000 mg | ORAL_TABLET | Freq: Every day | ORAL | Status: DC

## 2013-05-12 NOTE — Patient Instructions (Signed)
Limit your sodium (Salt) intake    It is important that you exercise regularly, at least 20 minutes 3 to 4 times per week.  If you develop chest pain or shortness of breath seek  medical attention.  Return in 6 months for follow-up

## 2013-05-12 NOTE — Progress Notes (Signed)
  Subjective:    Patient ID: Joseph Mejia, male    DOB: 1946-01-12, 67 y.o.   MRN: [id number]  HPI  Wt Readings from Last 3 Encounters:  05/12/13 141 lb (63.957 kg)  12/08/12 144 lb (65.318 kg)  07/21/12 140 lb (63.504 kg)    Review of Systems     Objective:   Physical Exam        Assessment & Plan:

## 2013-05-12 NOTE — Progress Notes (Signed)
Subjective:    Patient ID: Joseph Mejia, male    DOB: February 09, 1946, 67 y.o.   MRN: [id number]  HPI  67 year old patient who is seen today for his biannual followup. He has a history of universal UC and is followed by GI. He is treated hypertension and mild chronic kidney disease. His lisinopril has been down titrated to 10 mg daily. Blood pressure remains low normal today. He does have a history of ADHD which hasn't well controlled on Adderall. He is now retired but still feels he benefits from Adderall therapy. He has dyslipidemia and also a history of asthma. He has had increasing albuterol rescue use this spring.  Past Medical History  Diagnosis Date  . HYPERLIPIDEMIA 08/03/2007  . ANXIETY 08/03/2007  . DEPRESSION 08/03/2007  . ADD 02/23/2008  . HYPERTENSION 08/03/2007  . ASTHMA 08/03/2007  . GERD 08/03/2007  . UNIVERSAL ULCERATIVE COLITIS 01/26/2009  . Irritable bowel syndrome 11/02/2008  . RENAL DISEASE, CHRONIC, MILD 11/04/2008  . ERECTILE DYSFUNCTION, ORGANIC 02/23/2008  . OSTEOARTHRITIS 11/02/2008  . HIP PAIN, RIGHT 05/27/2008  . HAND PAIN, LEFT 06/20/2009  . OSTEOPENIA 07/26/2008  . CHEST PAIN 11/17/2008  . Closed fracture of four ribs 06/30/2008  . Failed moderate sedation during procedure 11/02/2008    History   Social History  . Marital Status: Married    Spouse Name: N/A    Number of Children: N/A  . Years of Education: N/A   Occupational History  . Not on file.   Social History Main Topics  . Smoking status: Never Smoker   . Smokeless tobacco: Never Used  . Alcohol Use: 4.2 oz/week    7 Glasses of wine per week  . Drug Use: No  . Sexually Active: Not on file   Other Topics Concern  . Not on file   Social History Narrative  . No narrative on file    Past Surgical History  Procedure Laterality Date  . Elbow surgery  01/2002    left  . Shoulder surgery  09/2007    injury at work, dislocated  . Incision and drainage perirectal abscess  1998  . Lumbar disc surgery   1992  . Hand surgery  02/2010    basal thumb joint removal and tendon transplant([facility])  . Colonoscopy w/ biopsies  06/2001, 08/2006, 07/2008    colon polyp, ulcerative colitis, lymphoid aggregates  . Upper gastrointestinal endoscopy  06/2001    2cm hiatal hernia  . Carpal tunnel release  2012  . Anterior fusion cervical spine  2012    Family History  Problem Relation Age of Onset  . Heart disease Mother     s/p CABG age 84  . Cancer Father     colon , brain  . Colon cancer Father   . COPD Sister   . Heart disease Brother     CAD  . Seizures Brother 67    Allergies  Allergen Reactions  . Aspirin     REACTION: unspecified  . Mercaptopurine     REACTION: myalgias  . Procaine Hcl     REACTION: MAKES HEART RACE    Current Outpatient Prescriptions on File Prior to Visit  Medication Sig Dispense Refill  . albuterol (PROAIR HFA) 108 (90 BASE) MCG/ACT inhaler Inhale 2 puffs into the lungs 4 (four) times daily.  2 Inhaler  6  . atorvastatin (LIPITOR) 40 MG tablet Take 1 tablet (40 mg total) by mouth daily.  90 tablet  3  . buPROPion [facility] SR)  150 MG 12 hr tablet Take 1 tablet (150 mg total) by mouth every morning.  90 tablet  2  . hyoscyamine (LEVBID) 0.375 MG 12 hr tablet Take 1 tablet (0.375 mg total) by mouth every 12 (twelve) hours as needed for cramping.  90 tablet  2  . lisinopril (PRINIVIL,ZESTRIL) 10 MG tablet Take 1 tablet (10 mg total) by mouth daily.  90 tablet  3  . Multiple Vitamin (MULTIVITAMIN) tablet Take 1 tablet by mouth daily.        Marland Kitchen omeprazole (PRILOSEC) 20 MG capsule Take 1 capsule (20 mg total) by mouth daily.  90 capsule  3  . predniSONE (DELTASONE) 1 MG tablet TAKE TWO TABLETS DAILY  60 tablet  2   No current facility-administered medications on file prior to visit.    BP 122/60  Pulse 58  Temp(Src) 98.2 F (36.8 C) (Oral)  Resp 20  Wt 141 lb (63.957 kg)  BMI 23.46 kg/m2  SpO2 98%      Review of Systems  Constitutional: Negative for  fever, chills, appetite change and fatigue.  HENT: Negative for hearing loss, ear pain, congestion, sore throat, trouble swallowing, neck stiffness, dental problem, voice change and tinnitus.   Eyes: Negative for pain, discharge and visual disturbance.  Respiratory: Positive for cough and wheezing. Negative for chest tightness and stridor.   Cardiovascular: Negative for chest pain, palpitations and leg swelling.  Gastrointestinal: Negative for nausea, vomiting, abdominal pain, diarrhea, constipation, blood in stool and abdominal distention.  Genitourinary: Negative for urgency, hematuria, flank pain, discharge, difficulty urinating and genital sores.  Musculoskeletal: Negative for myalgias, back pain, joint swelling, arthralgias and gait problem.  Skin: Negative for rash.  Neurological: Negative for dizziness, syncope, speech difficulty, weakness, numbness and headaches.  Hematological: Negative for adenopathy. Does not bruise/bleed easily.  Psychiatric/Behavioral: Positive for decreased concentration. Negative for behavioral problems and dysphoric mood. The patient is not nervous/anxious.        Objective:   Physical Exam  Constitutional: He is oriented to person, place, and time. He appears well-developed.  HENT:  Head: Normocephalic.  Right Ear: External ear normal.  Left Ear: External ear normal.  Eyes: Conjunctivae and EOM are normal.  Neck: Normal range of motion.  Cardiovascular: Normal rate and normal heart sounds.   Pulmonary/Chest: Breath sounds normal.  Abdominal: Bowel sounds are normal.  Musculoskeletal: Normal range of motion. He exhibits no edema and no tenderness.  Neurological: He is alert and oriented to person, place, and time.  Psychiatric: He has a normal mood and affect. His behavior is normal.          Assessment & Plan:   Hypertension. Well controlled  ADHD. We'll continue Adderall at this time  Chronic kidney disease stable  Anxiety depression stable   Dyslipidemia. Continue atorvastatin    CPX 6 months  Medications refilled

## 2013-06-03 ENCOUNTER — Other Ambulatory Visit: Payer: Self-pay | Admitting: Internal Medicine

## 2013-06-16 ENCOUNTER — Ambulatory Visit: Payer: Managed Care, Other (non HMO) | Admitting: Internal Medicine

## 2013-06-28 ENCOUNTER — Telehealth: Payer: Self-pay | Admitting: Internal Medicine

## 2013-06-28 NOTE — Telephone Encounter (Signed)
The pt wife called to request a 30 day refill of the following medications, hyoscyamine (LEVBID) 0.375 MG 12 hr tablet, and omeprazole (PRILOSEC) 20 MG capsule. She would like these sent to the [facility] in [place]. Please assist.

## 2013-06-30 ENCOUNTER — Ambulatory Visit (INDEPENDENT_AMBULATORY_CARE_PROVIDER_SITE_OTHER): Payer: Managed Care, Other (non HMO) | Admitting: Psychology

## 2013-06-30 DIAGNOSIS — F331 Major depressive disorder, recurrent, moderate: Secondary | ICD-10-CM

## 2013-06-30 DIAGNOSIS — F411 Generalized anxiety disorder: Secondary | ICD-10-CM

## 2013-06-30 MED ORDER — HYOSCYAMINE SULFATE ER 0.375 MG PO TB12: 0.3750 mg | ORAL_TABLET | Freq: Two times a day (BID) | ORAL | Status: DC | PRN

## 2013-06-30 MED ORDER — OMEPRAZOLE 20 MG PO CPDR: 20.0000 mg | DELAYED_RELEASE_CAPSULE | Freq: Every day | ORAL | Status: DC

## 2013-06-30 NOTE — Telephone Encounter (Signed)
Spoke to pt's wife told her Rx's were sent to [facility] as requested.

## 2013-07-15 ENCOUNTER — Other Ambulatory Visit (INDEPENDENT_AMBULATORY_CARE_PROVIDER_SITE_OTHER): Payer: Managed Care, Other (non HMO)

## 2013-07-15 ENCOUNTER — Ambulatory Visit (INDEPENDENT_AMBULATORY_CARE_PROVIDER_SITE_OTHER): Payer: Managed Care, Other (non HMO) | Admitting: Psychology

## 2013-07-15 ENCOUNTER — Other Ambulatory Visit: Payer: Self-pay | Admitting: Internal Medicine

## 2013-07-15 DIAGNOSIS — Z0000 Encounter for general adult medical examination without abnormal findings: Secondary | ICD-10-CM

## 2013-07-15 DIAGNOSIS — F331 Major depressive disorder, recurrent, moderate: Secondary | ICD-10-CM

## 2013-07-15 DIAGNOSIS — F411 Generalized anxiety disorder: Secondary | ICD-10-CM

## 2013-07-15 LAB — POCT URINALYSIS DIPSTICK
Leukocytes, UA: NEGATIVE
Nitrite, UA: NEGATIVE
Protein, UA: NEGATIVE
pH, UA: 5.5

## 2013-07-15 LAB — CBC WITH DIFFERENTIAL/PLATELET
Basophils Relative: 0.7 % (ref 0.0–3.0)
Eosinophils Absolute: 0.6 10*3/uL (ref 0.0–0.7)
HCT: 42.5 % (ref 39.0–52.0)
Lymphs Abs: 3.2 10*3/uL (ref 0.7–4.0)
MCHC: 33.2 g/dL (ref 30.0–36.0)
MCV: 92.2 fl (ref 78.0–100.0)
Monocytes Absolute: 0.9 10*3/uL (ref 0.1–1.0)
Neutro Abs: 3.5 10*3/uL (ref 1.4–7.7)
Neutrophils Relative %: 42.2 % — ABNORMAL LOW (ref 43.0–77.0)
RBC: 4.61 Mil/uL (ref 4.22–5.81)

## 2013-07-15 LAB — BASIC METABOLIC PANEL
CO2: 27 mEq/L (ref 19–32)
Chloride: 105 mEq/L (ref 96–112)
Creatinine, Ser: 1.7 mg/dL — ABNORMAL HIGH (ref 0.4–1.5)

## 2013-07-15 LAB — LIPID PANEL: Total CHOL/HDL Ratio: 4

## 2013-07-15 LAB — HEPATIC FUNCTION PANEL
Albumin: 3.9 g/dL (ref 3.5–5.2)
Total Protein: 7.1 g/dL (ref 6.0–8.3)

## 2013-07-15 LAB — PSA: PSA: 0.63 ng/mL (ref 0.10–4.00)

## 2013-07-15 LAB — TSH: TSH: 1.8 u[IU]/mL (ref 0.35–5.50)

## 2013-07-22 ENCOUNTER — Encounter: Payer: Self-pay | Admitting: Internal Medicine

## 2013-07-22 ENCOUNTER — Ambulatory Visit (INDEPENDENT_AMBULATORY_CARE_PROVIDER_SITE_OTHER): Payer: Managed Care, Other (non HMO) | Admitting: Internal Medicine

## 2013-07-22 VITALS — BP 110/60 | HR 61 | Temp 98.6°F | Resp 20 | Ht 65.5 in | Wt 138.0 lb

## 2013-07-22 DIAGNOSIS — M1990 Unspecified osteoarthritis, unspecified site: Secondary | ICD-10-CM

## 2013-07-22 DIAGNOSIS — I10 Essential (primary) hypertension: Secondary | ICD-10-CM

## 2013-07-22 DIAGNOSIS — Z2911 Encounter for prophylactic immunotherapy for respiratory syncytial virus (RSV): Secondary | ICD-10-CM

## 2013-07-22 DIAGNOSIS — Z23 Encounter for immunization: Secondary | ICD-10-CM

## 2013-07-22 DIAGNOSIS — F988 Other specified behavioral and emotional disorders with onset usually occurring in childhood and adolescence: Secondary | ICD-10-CM

## 2013-07-22 DIAGNOSIS — E785 Hyperlipidemia, unspecified: Secondary | ICD-10-CM

## 2013-07-22 DIAGNOSIS — K5100 Ulcerative (chronic) pancolitis without complications: Secondary | ICD-10-CM

## 2013-07-22 DIAGNOSIS — F411 Generalized anxiety disorder: Secondary | ICD-10-CM

## 2013-07-22 MED ORDER — AMPHETAMINE-DEXTROAMPHET ER 10 MG PO CP24: 10.0000 mg | ORAL_CAPSULE | ORAL | Status: DC

## 2013-07-22 MED ORDER — ZOLPIDEM TARTRATE 10 MG PO TABS: ORAL_TABLET | ORAL | Status: DC

## 2013-07-22 MED ORDER — ALPRAZOLAM 0.5 MG PO TABS: 0.5000 mg | ORAL_TABLET | Freq: Every day | ORAL | Status: DC

## 2013-07-22 NOTE — Progress Notes (Signed)
Patient ID: Joseph Mejia, male   DOB: Oct 23, 1946, 67 y.o.   MRN: [id number]  Subjective:    Patient ID: Joseph Mejia, male    DOB: [birth year], 67 y.o.   MRN: [id number]  HPI  67 -year-old patient who is in today for a wellness exam. He is followed by GI due to the universal colitis. His main complaints today are arthritic mainly involving the small joints of the hands left greater than right. He also has had some mild Dupuytren's contractures involving the right hand. Since his last physical he has had C-spine surgery in November as well as a right-sided carpal toe release in December of last year. He has a history of anxiety and ADHD and is now followed by Dr. Dellia Cloud. He is doing quite well after a recent retirement.  Past Medical History  Diagnosis Date  . HYPERLIPIDEMIA 08/03/2007  . ANXIETY 08/03/2007  . DEPRESSION 08/03/2007  . ADD 02/23/2008  . HYPERTENSION 08/03/2007  . ASTHMA 08/03/2007  . GERD 08/03/2007  . UNIVERSAL ULCERATIVE COLITIS 01/26/2009  . Irritable bowel syndrome 11/02/2008  . RENAL DISEASE, CHRONIC, MILD 11/04/2008  . ERECTILE DYSFUNCTION, ORGANIC 02/23/2008  . OSTEOARTHRITIS 11/02/2008  . HIP PAIN, RIGHT 05/27/2008  . HAND PAIN, LEFT 06/20/2009  . OSTEOPENIA 07/26/2008  . CHEST PAIN 11/17/2008  . Closed fracture of four ribs 06/30/2008  . Failed moderate sedation during procedure 11/02/2008    History   Social History  . Marital Status: Married    Spouse Name: N/A    Number of Children: N/A  . Years of Education: N/A   Occupational History  . Not on file.   Social History Main Topics  . Smoking status: Never Smoker   . Smokeless tobacco: Never Used  . Alcohol Use: 4.2 oz/week    7 Glasses of wine per week  . Drug Use: No  . Sexually Active: Not on file   Other Topics Concern  . Not on file   Social History Narrative  . No narrative on file    Past Surgical History  Procedure Laterality Date  . Elbow surgery  01/2002    left  . Shoulder surgery   09/2007    injury at work, dislocated  . Incision and drainage perirectal abscess  1998  . Lumbar disc surgery  1992  . Hand surgery  02/2010    basal thumb joint removal and tendon transplant([facility])  . Colonoscopy w/ biopsies  06/2001, 08/2006, 07/2008    colon polyp, ulcerative colitis, lymphoid aggregates  . Upper gastrointestinal endoscopy  06/2001    2cm hiatal hernia  . Carpal tunnel release  2012  . Anterior fusion cervical spine  2012    Family History  Problem Relation Age of Onset  . Heart disease Mother     s/p CABG age 4  . Cancer Father     colon , brain  . Colon cancer Father   . COPD Sister   . Heart disease Brother     CAD  . Seizures Brother 67    Allergies  Allergen Reactions  . Aspirin     REACTION: unspecified  . Mercaptopurine     REACTION: myalgias  . Procaine Hcl     REACTION: MAKES HEART RACE    Current Outpatient Prescriptions on File Prior to Visit  Medication Sig Dispense Refill  . albuterol (PROAIR HFA) 108 (90 BASE) MCG/ACT inhaler Inhale 2 puffs into the lungs 4 (four) times daily.  2 Inhaler  6  .  ALPRAZolam (XANAX) 0.5 MG tablet Take 1 tablet (0.5 mg total) by mouth daily.  30 tablet  3  . amphetamine-dextroamphetamine (ADDERALL XR) 10 MG 24 hr capsule Take 1 capsule (10 mg total) by mouth every morning.  30 capsule  0  . amphetamine-dextroamphetamine (ADDERALL XR) 10 MG 24 hr capsule Take 1 capsule (10 mg total) by mouth every morning.  30 capsule  0  . amphetamine-dextroamphetamine (ADDERALL XR) 10 MG 24 hr capsule Take 1 capsule (10 mg total) by mouth every morning.  30 capsule  0  . atorvastatin (LIPITOR) 40 MG tablet TAKE 1 TABLET (40 MG TOTAL) BY MOUTH DAILY.  30 tablet  0  . buPROPion (WELLBUTRIN SR) 150 MG 12 hr tablet TAKE 1 TABLET(S) BY MOUTH EVERY MORNING  30 tablet  5  . Calcium Carbonate-Vitamin D (CALTRATE 600+D) 600-400 MG-UNIT per tablet Take 1 tablet by mouth daily.      . Cyanocobalamin (VITAMIN B 12 PO) Take 5,000 mcg by  mouth daily.      . fexofenadine (ALLEGRA) 180 MG tablet Take 180 mg by mouth daily as needed.      . hyoscyamine (LEVBID) 0.375 MG 12 hr tablet Take 1 tablet (0.375 mg total) by mouth every 12 (twelve) hours as needed for cramping.  60 tablet  2  . lisinopril (PRINIVIL,ZESTRIL) 10 MG tablet Take 1 tablet (10 mg total) by mouth daily.  90 tablet  3  . Multiple Vitamin (MULTIVITAMIN) tablet Take 1 tablet by mouth daily.        Marland Kitchen omeprazole (PRILOSEC) 20 MG capsule Take 1 capsule (20 mg total) by mouth daily.  30 capsule  2  . predniSONE (DELTASONE) 1 MG tablet TAKE TWO TABLETS DAILY  60 tablet  2  . zolpidem (AMBIEN) 10 MG tablet TAKE 1 TABLET(S) BY MOUTH EVERY NIGHT AT BEDTIME AS NEEDED FOR SLEEP  30 tablet  3   No current facility-administered medications on file prior to visit.    BP 110/60  Pulse 61  Temp(Src) 98.6 F (37 C) (Oral)  Resp 20  Ht 5' 5.5" (1.664 m)  Wt 138 lb (62.596 kg)  BMI 22.61 kg/m2  SpO2 96%  '      Review of Systems  Constitutional: Negative for fever, chills, activity change, appetite change and fatigue.  HENT: Negative for hearing loss, ear pain, congestion, rhinorrhea, sneezing, mouth sores, trouble swallowing, neck pain, neck stiffness, dental problem, voice change, sinus pressure and tinnitus.   Eyes: Negative for photophobia, pain, redness and visual disturbance.  Respiratory: Negative for apnea, cough, choking, chest tightness, shortness of breath and wheezing.   Cardiovascular: Negative for chest pain, palpitations and leg swelling.  Gastrointestinal: Negative for nausea, vomiting, abdominal pain, diarrhea, constipation, blood in stool, abdominal distention, anal bleeding and rectal pain.  Genitourinary: Negative for dysuria, urgency, frequency, hematuria, flank pain, decreased urine volume, discharge, penile swelling, scrotal swelling, difficulty urinating, genital sores and testicular pain.  Musculoskeletal: Positive for myalgias and arthralgias.  Negative for back pain, joint swelling and gait problem.  Skin: Negative for color change, rash and wound.  Neurological: Negative for dizziness, tremors, seizures, syncope, facial asymmetry, speech difficulty, weakness, light-headedness, numbness and headaches.  Hematological: Negative for adenopathy. Does not bruise/bleed easily.  Psychiatric/Behavioral: Negative for suicidal ideas, hallucinations, behavioral problems, confusion, sleep disturbance, self-injury, dysphoric mood, decreased concentration and agitation. The patient is not nervous/anxious.        Objective:   Physical Exam  Constitutional: He appears well-developed and well-nourished.  HENT:  Head: Normocephalic and atraumatic.  Right Ear: External ear normal.  Left Ear: External ear normal.  Nose: Nose normal.  Mouth/Throat: Oropharynx is clear and moist.  Eyes: Conjunctivae and EOM are normal. Pupils are equal, round, and reactive to light. No scleral icterus.  Neck: Normal range of motion. Neck supple. No JVD present. No thyromegaly present.  Cardiovascular: Regular rhythm and intact distal pulses.  Exam reveals no gallop and no friction rub.   Murmur heard. Grade 2/6 systolic murmur  Pulmonary/Chest: Effort normal and breath sounds normal. He exhibits no tenderness.  Abdominal: Soft. Bowel sounds are normal. He exhibits no distension and no mass. There is no tenderness.  Genitourinary: Prostate normal and penis normal. Guaiac negative stool.  Prostate +2 enlarged  Musculoskeletal: Normal range of motion. He exhibits no edema and no tenderness.  Lymphadenopathy:    He has no cervical adenopathy.  Neurological: He is alert. He has normal reflexes. No cranial nerve deficit. Coordination normal.  Skin: Skin is warm and dry. No rash noted.  Psychiatric: He has a normal mood and affect. His behavior is normal.          Assessment & Plan:   Preventive health exam Ulcerative colitis Hypertension stable. Blood  pressure remains in a low-normal range we'll challenge off lisinopril Dyslipidemia. We'll continue atorvastatin 40 Osteoarthritis adhd. Continue Adderall  Recheck 6 months

## 2013-07-22 NOTE — Patient Instructions (Signed)
Limit your sodium (Salt) intake    It is important that you exercise regularly, at least 20 minutes 3 to 4 times per week.  If you develop chest pain or shortness of breath seek  medical attention.  Return in 6 months for follow-up

## 2013-08-04 ENCOUNTER — Other Ambulatory Visit: Payer: Self-pay | Admitting: Internal Medicine

## 2013-08-05 ENCOUNTER — Ambulatory Visit (INDEPENDENT_AMBULATORY_CARE_PROVIDER_SITE_OTHER): Payer: Managed Care, Other (non HMO) | Admitting: Psychology

## 2013-08-05 DIAGNOSIS — F411 Generalized anxiety disorder: Secondary | ICD-10-CM

## 2013-08-05 DIAGNOSIS — F331 Major depressive disorder, recurrent, moderate: Secondary | ICD-10-CM

## 2013-08-13 ENCOUNTER — Other Ambulatory Visit: Payer: Self-pay | Admitting: Internal Medicine

## 2013-09-06 ENCOUNTER — Other Ambulatory Visit: Payer: Self-pay | Admitting: Internal Medicine

## 2013-09-15 ENCOUNTER — Telehealth: Payer: Self-pay | Admitting: Internal Medicine

## 2013-09-15 NOTE — Telephone Encounter (Signed)
Wife following up on wellness form for [employer].

## 2013-09-20 NOTE — Telephone Encounter (Signed)
Spoke to [place] told her form was faxed to [place] on 9/11. Eber Jones verbalized understanding.

## 2013-09-27 ENCOUNTER — Other Ambulatory Visit: Payer: Self-pay | Admitting: Dermatology

## 2013-10-14 ENCOUNTER — Other Ambulatory Visit: Payer: Self-pay | Admitting: Internal Medicine

## 2013-10-30 ENCOUNTER — Other Ambulatory Visit: Payer: Self-pay | Admitting: Internal Medicine

## 2013-11-05 ENCOUNTER — Telehealth: Payer: Self-pay | Admitting: Internal Medicine

## 2013-11-05 DIAGNOSIS — F988 Other specified behavioral and emotional disorders with onset usually occurring in childhood and adolescence: Secondary | ICD-10-CM

## 2013-11-05 MED ORDER — AMPHETAMINE-DEXTROAMPHET ER 10 MG PO CP24: 10.0000 mg | ORAL_CAPSULE | ORAL | Status: DC

## 2013-11-05 NOTE — Telephone Encounter (Signed)
Spoke to pt told him Rx's ready for pickup will be at front desk. Pt verbalized understanding. Rx's printed and signed, put at front desk for pickup.

## 2013-11-05 NOTE — Telephone Encounter (Signed)
Pt request refill of amphetamine-dextroamphetamine (ADDERALL XR) 10 MG 24 hr capsule 1/day 90 day

## 2013-11-08 ENCOUNTER — Other Ambulatory Visit: Payer: Self-pay | Admitting: Internal Medicine

## 2013-11-09 ENCOUNTER — Other Ambulatory Visit: Payer: Self-pay | Admitting: *Deleted

## 2013-11-09 MED ORDER — PREDNISONE 1 MG PO TABS: ORAL_TABLET | ORAL | Status: DC

## 2013-12-01 ENCOUNTER — Other Ambulatory Visit: Payer: Self-pay | Admitting: Internal Medicine

## 2013-12-14 ENCOUNTER — Other Ambulatory Visit: Payer: Self-pay

## 2013-12-14 DIAGNOSIS — K5190 Ulcerative colitis, unspecified, without complications: Secondary | ICD-10-CM

## 2013-12-14 DIAGNOSIS — Z79899 Other long term (current) drug therapy: Secondary | ICD-10-CM

## 2013-12-14 NOTE — Progress Notes (Unsigned)
Patient is scheduled for a DEXA scan 12/16/13 9:30.  He is aware

## 2013-12-15 ENCOUNTER — Telehealth: Payer: Self-pay | Admitting: Internal Medicine

## 2013-12-15 NOTE — Telephone Encounter (Signed)
Dexa scan cancelled at the patient's request

## 2013-12-16 ENCOUNTER — Other Ambulatory Visit: Payer: Managed Care, Other (non HMO)

## 2014-01-01 ENCOUNTER — Other Ambulatory Visit: Payer: Self-pay | Admitting: Internal Medicine

## 2014-01-13 ENCOUNTER — Telehealth: Payer: Self-pay | Admitting: Internal Medicine

## 2014-01-13 MED ORDER — ALPRAZOLAM 0.5 MG PO TABS: 0.5000 mg | ORAL_TABLET | Freq: Every day | ORAL | Status: DC

## 2014-01-13 NOTE — Telephone Encounter (Signed)
[facility] [place] requesting refill of ALPRAZolam Duanne Moron) 0.5 MG tablet #30,  last filled 11/07/13

## 2014-01-18 ENCOUNTER — Other Ambulatory Visit: Payer: Self-pay | Admitting: Internal Medicine

## 2014-02-10 ENCOUNTER — Encounter: Payer: Self-pay | Admitting: *Deleted

## 2014-02-11 ENCOUNTER — Ambulatory Visit (INDEPENDENT_AMBULATORY_CARE_PROVIDER_SITE_OTHER): Payer: Managed Care, Other (non HMO) | Admitting: Internal Medicine

## 2014-02-11 ENCOUNTER — Encounter: Payer: Self-pay | Admitting: Internal Medicine

## 2014-02-11 VITALS — BP 120/80 | HR 54 | Temp 97.8°F | Wt 145.0 lb

## 2014-02-11 DIAGNOSIS — K5100 Ulcerative (chronic) pancolitis without complications: Secondary | ICD-10-CM

## 2014-02-11 DIAGNOSIS — R300 Dysuria: Secondary | ICD-10-CM

## 2014-02-11 DIAGNOSIS — F988 Other specified behavioral and emotional disorders with onset usually occurring in childhood and adolescence: Secondary | ICD-10-CM

## 2014-02-11 LAB — POCT URINALYSIS DIPSTICK
Bilirubin, UA: NEGATIVE
GLUCOSE UA: NEGATIVE
KETONES UA: NEGATIVE
Nitrite, UA: NEGATIVE
PROTEIN UA: NEGATIVE
Spec Grav, UA: 1.01
Urobilinogen, UA: 0.2
pH, UA: 6

## 2014-02-11 MED ORDER — CIPROFLOXACIN HCL 500 MG PO TABS: 500.0000 mg | ORAL_TABLET | Freq: Two times a day (BID) | ORAL | Status: DC

## 2014-02-11 MED ORDER — AMPHETAMINE-DEXTROAMPHET ER 10 MG PO CP24: 10.0000 mg | ORAL_CAPSULE | ORAL | Status: DC

## 2014-02-11 NOTE — Progress Notes (Signed)
Subjective:    Patient ID: Joseph Mejia, male    DOB: 06-Dec-1946, 68 y.o.   MRN: [id number]  HPI   68 year old patient who has a history of universal colitis.  He presents with a one-week history of worsening dysuria.  No fever or chills.  No history of kidney stones.  He does have a history of ADHD and requires an Adderall refill.  No new concerns or complaints  Past Medical History  Diagnosis Date  . HYPERLIPIDEMIA 08/03/2007  . ANXIETY 08/03/2007  . DEPRESSION 08/03/2007  . ADD 02/23/2008  . HYPERTENSION 08/03/2007  . ASTHMA 08/03/2007  . GERD 08/03/2007  . UNIVERSAL ULCERATIVE COLITIS 01/26/2009  . Irritable bowel syndrome 11/02/2008  . RENAL DISEASE, CHRONIC, MILD 11/04/2008  . ERECTILE DYSFUNCTION, ORGANIC 02/23/2008  . OSTEOARTHRITIS 11/02/2008  . HIP PAIN, RIGHT 05/27/2008  . HAND PAIN, LEFT 06/20/2009  . OSTEOPENIA 07/26/2008  . CHEST PAIN 11/17/2008  . Closed fracture of four ribs 06/30/2008  . Failed moderate sedation during procedure 11/02/2008    History   Social History  . Marital Status: Married    Spouse Name: N/A    Number of Children: N/A  . Years of Education: N/A   Occupational History  . Not on file.   Social History Main Topics  . Smoking status: Never Smoker   . Smokeless tobacco: Never Used  . Alcohol Use: 4.2 oz/week    7 Glasses of wine per week  . Drug Use: No  . Sexual Activity: Not on file   Other Topics Concern  . Not on file   Social History Narrative  . No narrative on file    Past Surgical History  Procedure Laterality Date  . Elbow surgery  01/2002    left  . Shoulder surgery  09/2007    injury at work, dislocated  . Incision and drainage perirectal abscess  1998  . Lumbar disc surgery  1992  . Hand surgery  02/2010    basal thumb joint removal and tendon transplant([facility])  . Colonoscopy w/ biopsies  06/2001, 08/2006, 07/2008    colon polyp, ulcerative colitis, lymphoid aggregates  . Upper gastrointestinal endoscopy  06/2001    2cm hiatal  hernia  . Carpal tunnel release  2012  . Anterior fusion cervical spine  2012    Family History  Problem Relation Age of Onset  . Heart disease Mother     s/p CABG age 70  . Cancer Father     colon , brain  . Colon cancer Father   . COPD Sister   . Heart disease Brother     CAD  . Seizures Brother 83    Allergies  Allergen Reactions  . Aspirin     REACTION: unspecified  . Mercaptopurine     REACTION: myalgias  . Procaine Hcl     REACTION: MAKES HEART RACE    Current Outpatient Prescriptions on File Prior to Visit  Medication Sig Dispense Refill  . albuterol (PROAIR HFA) 108 (90 BASE) MCG/ACT inhaler Inhale 2 puffs into the lungs 4 (four) times daily.  2 Inhaler  6  . ALPRAZolam (XANAX) 0.5 MG tablet Take 1 tablet (0.5 mg total) by mouth daily.  30 tablet  2  . amphetamine-dextroamphetamine (ADDERALL XR) 10 MG 24 hr capsule Take 1 capsule (10 mg total) by mouth every morning.  30 capsule  0  . amphetamine-dextroamphetamine (ADDERALL XR) 10 MG 24 hr capsule Take 1 capsule (10 mg total) by mouth every  morning.  30 capsule  0  . amphetamine-dextroamphetamine (ADDERALL XR) 10 MG 24 hr capsule Take 1 capsule (10 mg total) by mouth every morning.  30 capsule  0  . atorvastatin (LIPITOR) 40 MG tablet TAKE 1 TABLET (40 MG TOTAL) BY MOUTH DAILY.  90 tablet  3  . buPROPion (WELLBUTRIN SR) 150 MG 12 hr tablet TAKE 1 TABLET BY MOUTH EVERY MORNING  30 tablet  4  . Calcium Carbonate-Vitamin D (CALTRATE 600+D) 600-400 MG-UNIT per tablet Take 1 tablet by mouth daily.      . Cyanocobalamin (VITAMIN B 12 PO) Take 5,000 mcg by mouth daily.      . fexofenadine (ALLEGRA) 180 MG tablet Take 180 mg by mouth daily as needed.      . hyoscyamine (LEVBID) 0.375 MG 12 hr tablet TAKE 1 TABLET (0.375 MG TOTAL) BY MOUTH EVERY 12 (TWELVE) HOURS AS NEEDED FOR CRAMPING.  60 tablet  2  . lisinopril (PRINIVIL,ZESTRIL) 10 MG tablet TAKE 1 TABLET (10 MG TOTAL) BY MOUTH DAILY.  90 tablet  1  . Multiple Vitamin  (MULTIVITAMIN) tablet Take 1 tablet by mouth daily.        Marland Kitchen omeprazole (PRILOSEC) 20 MG capsule TAKE 1 CAPSULE (20 MG TOTAL) BY MOUTH DAILY.  30 capsule  5  . predniSONE (DELTASONE) 1 MG tablet TAKE TWO TABLETS DAILY  60 tablet  6  . zolpidem (AMBIEN) 10 MG tablet TAKE 1 TABLET(S) BY MOUTH EVERY NIGHT AT BEDTIME AS NEEDED FOR SLEEP  30 tablet  3   No current facility-administered medications on file prior to visit.    BP 120/80  Pulse 54  Temp(Src) 97.8 F (36.6 C) (Oral)  Wt 145 lb (65.772 kg)  SpO2 94%      Review of Systems  Constitutional: Negative for fever, chills, appetite change and fatigue.  HENT: Negative for congestion, dental problem, ear pain, hearing loss, sore throat, tinnitus, trouble swallowing and voice change.   Eyes: Negative for pain, discharge and visual disturbance.  Respiratory: Negative for cough, chest tightness, wheezing and stridor.   Cardiovascular: Negative for chest pain, palpitations and leg swelling.  Gastrointestinal: Negative for nausea, vomiting, abdominal pain, diarrhea, constipation, blood in stool and abdominal distention.  Genitourinary: Positive for dysuria and frequency. Negative for urgency, hematuria, flank pain, discharge, difficulty urinating and genital sores.  Musculoskeletal: Negative for arthralgias, back pain, gait problem, joint swelling, myalgias and neck stiffness.  Skin: Negative for rash.  Neurological: Negative for dizziness, syncope, speech difficulty, weakness, numbness and headaches.  Hematological: Negative for adenopathy. Does not bruise/bleed easily.  Psychiatric/Behavioral: Negative for behavioral problems and dysphoric mood. The patient is not nervous/anxious.        Objective:   Physical Exam  Constitutional: He is oriented to person, place, and time. He appears well-developed.  HENT:  Head: Normocephalic.  Right Ear: External ear normal.  Left Ear: External ear normal.  Eyes: Conjunctivae and EOM are normal.   Neck: Normal range of motion.  Cardiovascular: Normal rate and normal heart sounds.   Pulmonary/Chest: Breath sounds normal.  Abdominal: Soft. Bowel sounds are normal. There is tenderness.  Mild suprapubic tenderness  Musculoskeletal: Normal range of motion. He exhibits no edema and no tenderness.  Neurological: He is alert and oriented to person, place, and time.  Psychiatric: He has a normal mood and affect. His behavior is normal.          Assessment & Plan:   Early UTI.  Will treatment with Cipro for  3 days  ADHD.  Adderall refilled  Ulcerative colitis.  Followup GI

## 2014-02-11 NOTE — Patient Instructions (Signed)
Drink as much fluid as you  can tolerate over the next few days  Take your antibiotic as prescribed until ALL of it is gone, but stop if you develop a rash, swelling, or any side effects of the medication.  Contact our office as soon as possible if  there are side effects of the medication.  Return in 6 months for follow-up

## 2014-02-11 NOTE — Progress Notes (Signed)
Pre visit review using our clinic review tool, if applicable. No additional management support is needed unless otherwise documented below in the visit note. 

## 2014-02-16 ENCOUNTER — Telehealth: Payer: Self-pay | Admitting: Internal Medicine

## 2014-02-16 NOTE — Telephone Encounter (Signed)
[facility] Triage Call Report Triage Record Num: [id number] Operator: Amada Kingfisher Patient Name: Joseph Mejia Call Date & Time: 02/15/2014 1:51:37PM Patient Phone: [phone] PCP: Marletta Lor Patient Gender: Male PCP Fax : [phone] Patient DOB: [birth year] Practice Name: [facility] Reason for Call: Caller: Carolyn/Spouse; PCP: Bluford Kaufmann (Family Practice > 14yr old); CB#: [phone] Call regarding Diarrhea/vomiting; onset 02/15/14; vomited 1x; had 4-5 episodes of diarrhea; has taken a Phenergan supp; feels weak; afebrile; took Cipro 2/13-2/15; All emergent sxs of Diarrhea or Other Change in Bowel Habits protocol r/o; home care advice given for new onset diarrhea with any of the following: cramping, aching , temp or several episodes of N/V Protocol(s) Used: Diarrhea or Other Change in Bowel Habits Recommended Outcome per Protocol: Provide Home/Self Care Reason for Outcome: New onset diarrhea with any of the following: mild abdominal cramping, generalized aching, temperature up to 101.5 F (38.6 C) or several episodes of nausea/vomiting Care Advice: Antidiarrheal medications are usually unnecessary. Use only after consulting your provider. Application of A&D ointment or witch hazel medicated pads may help relieve anal irritation. ~ ~ SYMPTOM / CONDITION MANAGEMENT Vomiting and Diarrhea Care: - Do not eat solid foods until vomiting subsides. - Begin taking fluids by sucking on ice chips or popsicles or taking sips of cool, clear fluids (soda, fruit juices that are low acid, sports drinks or nonprescription oral rehydration solution). - Gradually drink larger amounts of these fluids so that you are drinking six to eight 8 oz. (1.2 to 1.6 liters) of fluids a day. - Keep activity to a minimum. - Once vomiting and diarrhea subside, eat smaller, more frequent meals of easily digested foods such as crackers, toast, bananas, rice, cooked cereal,  applesauce, broth, baked or mashed potatoes, chicken or t[place] skin. Eat slowly. - Take fluids 30 minutes before or 60 minutes after meals. - Avoid high fat, highly seasoned, high fiber or high sugar content foods. - Avoid extremely hot or cold foods. - Do not take pain medication (such as aspirin, NSAIDs) while nauseated or vomiting. - Do not drink caffeinated or alcoholic beverages. ~ Go to the ED if you have developed signs and symptoms of dehydration such as very dry mouth and tongue; increased pulse rate at rest; no urine output for 12 hours or more; increasing weakness or drowsiness, or lightheadedness when trying to sit upright or standing. ~ 02/15/2014 2:24:07PM Page 1 of 1 [id number]

## 2014-03-12 ENCOUNTER — Other Ambulatory Visit: Payer: Self-pay | Admitting: Internal Medicine

## 2014-03-17 ENCOUNTER — Ambulatory Visit (INDEPENDENT_AMBULATORY_CARE_PROVIDER_SITE_OTHER): Payer: Managed Care, Other (non HMO) | Admitting: Internal Medicine

## 2014-03-17 ENCOUNTER — Encounter: Payer: Self-pay | Admitting: Internal Medicine

## 2014-03-17 VITALS — BP 132/60 | HR 64 | Ht 64.75 in | Wt 143.0 lb

## 2014-03-17 DIAGNOSIS — M949 Disorder of cartilage, unspecified: Secondary | ICD-10-CM

## 2014-03-17 DIAGNOSIS — K5100 Ulcerative (chronic) pancolitis without complications: Secondary | ICD-10-CM

## 2014-03-17 DIAGNOSIS — K589 Irritable bowel syndrome without diarrhea: Secondary | ICD-10-CM

## 2014-03-17 DIAGNOSIS — M899 Disorder of bone, unspecified: Secondary | ICD-10-CM

## 2014-03-17 NOTE — Patient Instructions (Signed)
Please get Dr. Raliegh Ip to do a DEXA scan this summer.  We will see you next year for your colonoscopy.    I appreciate the opportunity to care for you.

## 2014-03-17 NOTE — Assessment & Plan Note (Addendum)
Stable - no changes Colonoscopy later this year

## 2014-03-17 NOTE — Assessment & Plan Note (Signed)
DEXA this summer

## 2014-03-19 NOTE — Assessment & Plan Note (Signed)
Asx

## 2014-03-19 NOTE — Progress Notes (Signed)
         Subjective:    Patient ID: Joseph Mejia, male    DOB: 1946-06-07, 68 y.o.   MRN: [id number]  HPI The patient is here for f/u chronic UC. He has no active GI c/o. Maintained on low-dose prednisone since he is intolerant of immunomodulators and had rise in creatinine on mesalamine. 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  He has not yet done f/u DEXA and would like to wait some on that. Will ask Dr. Burnice Logan to order. Medications, allergies, past medical history, past surgical history, family history and social history are reviewed and updated in the EMR.  Review of Systems As above    Objective:   Physical Exam General:  NAD Eyes:   anicteric Lungs:  clear Heart:  S1S2 no rubs, murmurs or gallops Abdomen:  soft and nontender, BS+ Ext:   no edema     Assessment & Plan:  UNIVERSAL ULCERATIVE COLITIS Stable - no changes Colonoscopy later this year   OSTEOPENIA DEXA this summer  Irritable bowel syndrome Asx

## 2014-04-25 ENCOUNTER — Other Ambulatory Visit: Payer: Self-pay | Admitting: Internal Medicine

## 2014-04-26 ENCOUNTER — Ambulatory Visit (INDEPENDENT_AMBULATORY_CARE_PROVIDER_SITE_OTHER): Payer: Managed Care, Other (non HMO) | Admitting: Internal Medicine

## 2014-04-26 ENCOUNTER — Encounter: Payer: Self-pay | Admitting: Internal Medicine

## 2014-04-26 VITALS — BP 132/70 | HR 90 | Temp 98.0°F | Resp 20 | Ht 64.75 in | Wt 141.0 lb

## 2014-04-26 DIAGNOSIS — J45909 Unspecified asthma, uncomplicated: Secondary | ICD-10-CM

## 2014-04-26 DIAGNOSIS — K5100 Ulcerative (chronic) pancolitis without complications: Secondary | ICD-10-CM

## 2014-04-26 DIAGNOSIS — I10 Essential (primary) hypertension: Secondary | ICD-10-CM

## 2014-04-26 MED ORDER — METHYLPREDNISOLONE ACETATE 80 MG/ML IJ SUSP
80.0000 mg | Freq: Once | INTRAMUSCULAR | Status: AC
Start: 2014-04-26 — End: 2014-04-26
  Administered 2014-04-26: 80 mg via INTRAMUSCULAR

## 2014-04-26 MED ORDER — BUDESONIDE 90 MCG/ACT IN AEPB: 2.0000 | INHALATION_SPRAY | Freq: Two times a day (BID) | RESPIRATORY_TRACT | Status: DC

## 2014-04-26 MED ORDER — ALBUTEROL SULFATE HFA 108 (90 BASE) MCG/ACT IN AERS: 2.0000 | INHALATION_SPRAY | Freq: Four times a day (QID) | RESPIRATORY_TRACT | Status: DC

## 2014-04-26 NOTE — Progress Notes (Signed)
Subjective:    Patient ID: Joseph Mejia, male    DOB: [birth year], 68 y.o.   MRN: [id number]  HPI  68 year old patient who has a history of asthma.  He presents with a three-week history of increasing cough, congestion, wheezing, and shortness of breath.  He does have a history of universal UC and has been on chronic low-dose prednisone therapy.  Presently, 2 mg per day.  His chief complaint is cough, worse at night over the past 3 weeks.  There has been some intermittent wheezing and shortness of breath.  No fever or sputum production.  He has been using expectorants, as well as antihistamines.  Past Medical History  Diagnosis Date  . HYPERLIPIDEMIA 08/03/2007  . ANXIETY 08/03/2007  . DEPRESSION 08/03/2007  . ADD 02/23/2008  . HYPERTENSION 08/03/2007  . ASTHMA 08/03/2007  . GERD 08/03/2007  . UNIVERSAL ULCERATIVE COLITIS 01/26/2009  . Irritable bowel syndrome 11/02/2008  . RENAL DISEASE, CHRONIC, MILD 11/04/2008  . ERECTILE DYSFUNCTION, ORGANIC 02/23/2008  . OSTEOARTHRITIS 11/02/2008  . HIP PAIN, RIGHT 05/27/2008  . HAND PAIN, LEFT 06/20/2009  . OSTEOPENIA 07/26/2008  . CHEST PAIN 11/17/2008  . Closed fracture of four ribs 06/30/2008  . Failed moderate sedation during procedure 11/02/2008    History   Social History  . Marital Status: Married    Spouse Name: N/A    Number of Children: N/A  . Years of Education: N/A   Occupational History  . Not on file.   Social History Main Topics  . Smoking status: Never Smoker   . Smokeless tobacco: Never Used  . Alcohol Use: 4.2 oz/week    7 Glasses of wine per week  . Drug Use: No  . Sexual Activity: Not on file   Other Topics Concern  . Not on file   Social History Narrative  . No narrative on file    Past Surgical History  Procedure Laterality Date  . Elbow surgery  01/2002    left  . Shoulder surgery  09/2007    injury at work, dislocated  . Incision and drainage perirectal abscess  1998  . Lumbar disc surgery  1992  . Hand surgery   02/2010    basal thumb joint removal and tendon transplant([facility])  . Colonoscopy w/ biopsies  06/2001, 08/2006, 07/2008    colon polyp, ulcerative colitis, lymphoid aggregates  . Upper gastrointestinal endoscopy  06/2001    2cm hiatal hernia  . Carpal tunnel release  2012  . Anterior fusion cervical spine  2012  . Basal cell carcinoma excision Left     ear, head    Family History  Problem Relation Age of Onset  . Heart disease Mother     s/p CABG age 38  . Cancer Father     colon , brain  . Colon cancer Father   . COPD Sister   . Heart disease Brother     CAD  . Seizures Brother 69    Allergies  Allergen Reactions  . Aspirin     REACTION: unspecified  . Mercaptopurine     REACTION: myalgias  . Procaine Hcl     REACTION: MAKES HEART RACE    Current Outpatient Prescriptions on File Prior to Visit  Medication Sig Dispense Refill  . ALPRAZolam (XANAX) 0.5 MG tablet Take 0.25 mg by mouth daily.      Marland Kitchen amphetamine-dextroamphetamine (ADDERALL XR) 10 MG 24 hr capsule Take 1 capsule (10 mg total) by mouth every morning.  [place]  capsule  0  . atorvastatin (LIPITOR) 40 MG tablet TAKE 1 TABLET (40 MG TOTAL) BY MOUTH DAILY.  90 tablet  3  . buPROPion (WELLBUTRIN SR) 150 MG 12 hr tablet TAKE 1 TABLET BY MOUTH EVERY MORNING  30 tablet  4  . Calcium Carbonate-Vitamin D (CALTRATE 600+D) 600-400 MG-UNIT per tablet Take 1 tablet by mouth daily.      . Cyanocobalamin (VITAMIN B 12 PO) Take 5,000 mcg by mouth daily.      . fexofenadine (ALLEGRA) 180 MG tablet Take 180 mg by mouth daily as needed.      . hyoscyamine (LEVBID) 0.375 MG 12 hr tablet TAKE 1 TABLET (0.375 MG TOTAL) BY MOUTH EVERY 12 (TWELVE) HOURS AS NEEDED FOR CRAMPING.  60 tablet  2  . lisinopril (PRINIVIL,ZESTRIL) 10 MG tablet TAKE 1 TABLET (10 MG TOTAL) BY MOUTH DAILY.  90 tablet  1  . Multiple Vitamin (MULTIVITAMIN) tablet Take 1 tablet by mouth daily.        Marland Kitchen omeprazole (PRILOSEC) 20 MG capsule TAKE 1 CAPSULE (20 MG TOTAL) BY  MOUTH DAILY.  30 capsule  12  . predniSONE (DELTASONE) 1 MG tablet TAKE TWO TABLETS DAILY  60 tablet  6  . PROAIR HFA 108 (90 BASE) MCG/ACT inhaler INHALE 2 PUFFS INTO THE LUNGS 4 TIMES DAILY  (TWO INHALERS)  17 g  5  . zolpidem (AMBIEN) 10 MG tablet TAKE 1 TABLET(S) BY MOUTH EVERY NIGHT AT BEDTIME AS NEEDED FOR SLEEP  30 tablet  3   No current facility-administered medications on file prior to visit.    BP 132/70  Pulse 90  Temp(Src) 98 F (36.7 C) (Oral)  Resp 20  Ht 5' 4.75" (1.645 m)  Wt 141 lb (63.957 kg)  BMI 23.64 kg/m2  SpO2 94%       Review of Systems  Constitutional: Positive for fatigue. Negative for fever, chills and appetite change.  HENT: Negative for congestion, dental problem, ear pain, hearing loss, sore throat, tinnitus, trouble swallowing and voice change.   Eyes: Positive for itching. Negative for pain, discharge and visual disturbance.  Respiratory: Positive for cough, shortness of breath and wheezing. Negative for chest tightness and stridor.   Cardiovascular: Negative for chest pain, palpitations and leg swelling.  Gastrointestinal: Negative for nausea, vomiting, abdominal pain, diarrhea, constipation, blood in stool and abdominal distention.  Genitourinary: Negative for urgency, hematuria, flank pain, discharge, difficulty urinating and genital sores.  Musculoskeletal: Negative for arthralgias, back pain, gait problem, joint swelling, myalgias and neck stiffness.  Skin: Negative for rash.  Neurological: Negative for dizziness, syncope, speech difficulty, weakness, numbness and headaches.  Hematological: Negative for adenopathy. Does not bruise/bleed easily.  Psychiatric/Behavioral: Negative for behavioral problems and dysphoric mood. The patient is not nervous/anxious.        Objective:   Physical Exam  Constitutional: He is oriented to person, place, and time. He appears well-developed.  HENT:  Head: Normocephalic.  Right Ear: External ear normal.    Left Ear: External ear normal.  Eyes: Conjunctivae and EOM are normal.  Neck: Normal range of motion.  Cardiovascular: Normal rate and normal heart sounds.   Pulmonary/Chest: Effort normal. No respiratory distress. He has wheezes. He has no rales.  A few scattered, coarse rhonchi and faint wheezing  Abdominal: Bowel sounds are normal.  Musculoskeletal: Normal range of motion. He exhibits no edema and no tenderness.  Neurological: He is alert and oriented to person, place, and time.  Psychiatric: He has a normal  mood and affect. His behavior is normal.          Assessment & Plan:   Asthma exacerbation.  Chief symptom is cough, but does have some wheezing.  We'll continue expectorants and discontinue antihistamines.  Will treat with Depo-Medrol 80 and start on oral inhalational Pulmicort Hypertension stable

## 2014-04-26 NOTE — Addendum Note (Signed)
Addended by: Marian Sorrow on: 04/26/2014 12:19 PM   Modules accepted: Orders

## 2014-04-26 NOTE — Patient Instructions (Signed)
Pulmicort 2 puffs twice daily  Asthma, Acute Bronchospasm Acute bronchospasm caused by asthma is also referred to as an asthma attack. Bronchospasm means your air passages become narrowed. The narrowing is caused by inflammation and tightening of the muscles in the air tubes (bronchi) in your lungs. This can make it hard to breath or cause you to wheeze and cough. CAUSES Possible triggers are:  Animal dander from the skin, hair, or feathers of animals.  Dust mites contained in house dust.  Cockroaches.  Pollen from trees or grass.  Mold.  Cigarette or tobacco smoke.  Air pollutants such as dust, household cleaners, hair sprays, aerosol sprays, paint fumes, strong chemicals, or strong odors.  Cold air or weather changes. Cold air may trigger inflammation. Winds increase molds and pollens in the air.  Strong emotions such as crying or laughing hard.  Stress.  Certain medicines such as aspirin or beta-blockers.  Sulfites in foods and drinks, such as dried fruits and wine.  Infections or inflammatory conditions, such as a flu, cold, or inflammation of the nasal membranes (rhinitis).  Gastroesophageal reflux disease (GERD). GERD is a condition where stomach acid backs up into your throat (esophagus).  Exercise or strenuous activity. SIGNS AND SYMPTOMS   Wheezing.  Excessive coughing, particularly at night.  Chest tightness.  Shortness of breath. DIAGNOSIS  Your health care provider will ask you about your medical history and perform a physical exam. A chest X-ray or blood testing may be performed to look for other causes of your symptoms or other conditions that may have triggered your asthma attack. TREATMENT  Treatment is aimed at reducing inflammation and opening up the airways in your lungs. Most asthma attacks are treated with inhaled medicines. These include quick relief or rescue medicines (such as bronchodilators) and controller medicines (such as inhaled  corticosteroids). These medicines are sometimes given through an inhaler or a nebulizer. Systemic steroid medicine taken by mouth or given through an IV tube also can be used to reduce the inflammation when an attack is moderate or severe. Antibiotic medicines are only used if a bacterial infection is present.  HOME CARE INSTRUCTIONS   Rest.  Drink plenty of liquids. This helps the mucus to remain thin and be easily coughed up. Only use caffeine in moderation and do not use alcohol until you have recovered from your illness.  Do not smoke. Avoid being exposed to secondhand smoke.  You play a critical role in keeping yourself in good health. Avoid exposure to things that cause you to wheeze or to have breathing problems.  Keep your medicines up to date and available. Carefully follow your health care provider's treatment plan.  Take your medicine exactly as prescribed.  When pollen or pollution is bad, keep windows closed and use an air conditioner or go to places with air conditioning.  Asthma requires careful medical care. See your health care provider for a follow-up as advised. If you are more than [redacted] weeks pregnant and you were prescribed any new medicines, let your obstetrician know about the visit and how you are doing. Follow-up with your health care provider as directed.  After you have recovered from your asthma attack, make an appointment with your outpatient doctor to talk about ways to reduce the likelihood of future attacks. If you do not have a doctor who manages your asthma, make an appointment with a primary care doctor to discuss your asthma. SEEK IMMEDIATE MEDICAL CARE IF:   You are getting worse.  You  have trouble breathing. If severe, call your local emergency services (911 in the [place]  You develop chest pain or discomfort.  You are vomiting.  You are not able to keep fluids down.  You are coughing up yellow, green, brown, or bloody sputum.  You have a fever  and your symptoms suddenly get worse.  You have trouble swallowing. MAKE SURE YOU:   Understand these instructions.  Will watch your condition.  Will get help right away if you are not doing well or get worse. Document Released: 04/02/2007 Document Revised: 08/18/2013 Document Reviewed: 06/23/2013 [facility] Patient Information 2014 [place], [place].

## 2014-04-26 NOTE — Progress Notes (Signed)
Pre-visit discussion using our clinic review tool. No additional management support is needed unless otherwise documented below in the visit note.  

## 2014-05-03 ENCOUNTER — Other Ambulatory Visit: Payer: Self-pay | Admitting: Internal Medicine

## 2014-05-11 ENCOUNTER — Other Ambulatory Visit: Payer: Self-pay | Admitting: Internal Medicine

## 2014-05-24 ENCOUNTER — Other Ambulatory Visit: Payer: Self-pay | Admitting: Internal Medicine

## 2014-06-02 ENCOUNTER — Telehealth: Payer: Self-pay | Admitting: Internal Medicine

## 2014-06-02 DIAGNOSIS — F988 Other specified behavioral and emotional disorders with onset usually occurring in childhood and adolescence: Secondary | ICD-10-CM

## 2014-06-02 MED ORDER — AMPHETAMINE-DEXTROAMPHET ER 10 MG PO CP24: 10.0000 mg | ORAL_CAPSULE | ORAL | Status: DC

## 2014-06-02 MED ORDER — AMPHETAMINE-DEXTROAMPHET ER 10 MG PO CP24
10.0000 mg | ORAL_CAPSULE | ORAL | Status: DC
Start: 2014-06-02 — End: 2014-08-18

## 2014-06-02 NOTE — Telephone Encounter (Signed)
Pt needs new rx generic adderall xr 10 mg

## 2014-06-02 NOTE — Telephone Encounter (Signed)
Pt notified Rx's ready for pickup. Rx's printed and signed.  

## 2014-06-16 ENCOUNTER — Other Ambulatory Visit: Payer: Self-pay | Admitting: Internal Medicine

## 2014-06-19 ENCOUNTER — Other Ambulatory Visit: Payer: Self-pay | Admitting: Internal Medicine

## 2014-07-03 ENCOUNTER — Other Ambulatory Visit: Payer: Self-pay | Admitting: Internal Medicine

## 2014-07-14 ENCOUNTER — Other Ambulatory Visit (INDEPENDENT_AMBULATORY_CARE_PROVIDER_SITE_OTHER): Payer: Managed Care, Other (non HMO)

## 2014-07-14 DIAGNOSIS — Z0000 Encounter for general adult medical examination without abnormal findings: Secondary | ICD-10-CM

## 2014-07-14 LAB — LIPID PANEL
Cholesterol: 192 mg/dL (ref 0–200)
HDL: 51.3 mg/dL (ref >39.00)
LDL Cholesterol: 121 mg/dL — ABNORMAL HIGH (ref 0–99)
NONHDL: 140.7
Total CHOL/HDL Ratio: 4
Triglycerides: 100 mg/dL (ref 0.0–149.0)
VLDL: 20 mg/dL (ref 0.0–40.0)

## 2014-07-14 LAB — CBC WITH DIFFERENTIAL/PLATELET
Basophils Absolute: 0 10*3/uL (ref 0.0–0.1)
Basophils Relative: 0.6 % (ref 0.0–3.0)
Eosinophils Absolute: 0.4 10*3/uL (ref 0.0–0.7)
Eosinophils Relative: 6 % — ABNORMAL HIGH (ref 0.0–5.0)
HCT: 41.5 % (ref 39.0–52.0)
HEMOGLOBIN: 13.9 g/dL (ref 13.0–17.0)
LYMPHS PCT: 37.7 % (ref 12.0–46.0)
Lymphs Abs: 2.5 10*3/uL (ref 0.7–4.0)
MCHC: 33.6 g/dL (ref 30.0–36.0)
MCV: 93.8 fl (ref 78.0–100.0)
MONOS PCT: 10.5 % (ref 3.0–12.0)
Monocytes Absolute: 0.7 10*3/uL (ref 0.1–1.0)
Neutro Abs: 3 10*3/uL (ref 1.4–7.7)
Neutrophils Relative %: 45.2 % (ref 43.0–77.0)
PLATELETS: 199 10*3/uL (ref 150.0–400.0)
RBC: 4.42 Mil/uL (ref 4.22–5.81)
RDW: 13.3 % (ref 11.5–15.5)
WBC: 6.6 10*3/uL (ref 4.0–10.5)

## 2014-07-14 LAB — POCT URINALYSIS DIPSTICK
Bilirubin, UA: NEGATIVE
Blood, UA: NEGATIVE
Glucose, UA: NEGATIVE
Ketones, UA: NEGATIVE
LEUKOCYTES UA: NEGATIVE
NITRITE UA: NEGATIVE
PH UA: 5.5
PROTEIN UA: NEGATIVE
Spec Grav, UA: 1.015
Urobilinogen, UA: 0.2

## 2014-07-14 LAB — HEPATIC FUNCTION PANEL
ALBUMIN: 3.9 g/dL (ref 3.5–5.2)
ALT: 27 U/L (ref 0–53)
AST: 26 U/L (ref 0–37)
Alkaline Phosphatase: 65 U/L (ref 39–117)
Bilirubin, Direct: 0.1 mg/dL (ref 0.0–0.3)
Total Bilirubin: 0.6 mg/dL (ref 0.2–1.2)
Total Protein: 6.8 g/dL (ref 6.0–8.3)

## 2014-07-14 LAB — BASIC METABOLIC PANEL
BUN: 29 mg/dL — AB (ref 6–23)
CO2: 28 mEq/L (ref 19–32)
Calcium: 9.2 mg/dL (ref 8.4–10.5)
Chloride: 106 mEq/L (ref 96–112)
Creatinine, Ser: 1.8 mg/dL — ABNORMAL HIGH (ref 0.4–1.5)
GFR: 41.16 mL/min — AB (ref >60.00)
Glucose, Bld: 101 mg/dL — ABNORMAL HIGH (ref 70–99)
POTASSIUM: 4.8 meq/L (ref 3.5–5.1)
SODIUM: 140 meq/L (ref 135–145)

## 2014-07-14 LAB — PSA: PSA: 0.5 ng/mL (ref 0.10–4.00)

## 2014-07-14 LAB — TSH: TSH: 1.43 u[IU]/mL (ref 0.35–4.50)

## 2014-07-21 ENCOUNTER — Telehealth: Payer: Self-pay | Admitting: Internal Medicine

## 2014-07-21 ENCOUNTER — Other Ambulatory Visit: Payer: Managed Care, Other (non HMO)

## 2014-07-21 MED ORDER — ALPRAZOLAM 0.5 MG PO TABS: 0.2500 mg | ORAL_TABLET | Freq: Every day | ORAL | Status: DC

## 2014-07-21 NOTE — Telephone Encounter (Addendum)
rx called in to [facility] 

## 2014-07-21 NOTE — Telephone Encounter (Signed)
[facility], [place] [address] is requesting a re-fill on ALPRAZolam (XANAX) 0.5 MG tablet

## 2014-07-21 NOTE — Telephone Encounter (Signed)
ok 

## 2014-07-21 NOTE — Telephone Encounter (Signed)
Please advise 

## 2014-07-25 ENCOUNTER — Ambulatory Visit (INDEPENDENT_AMBULATORY_CARE_PROVIDER_SITE_OTHER): Payer: Managed Care, Other (non HMO) | Admitting: Family Medicine

## 2014-07-25 ENCOUNTER — Encounter: Payer: Self-pay | Admitting: Family Medicine

## 2014-07-25 VITALS — BP 110/80 | HR 64 | Temp 98.0°F | Wt 142.0 lb

## 2014-07-25 DIAGNOSIS — J45901 Unspecified asthma with (acute) exacerbation: Secondary | ICD-10-CM

## 2014-07-25 DIAGNOSIS — J4531 Mild persistent asthma with (acute) exacerbation: Secondary | ICD-10-CM

## 2014-07-25 DIAGNOSIS — J45909 Unspecified asthma, uncomplicated: Secondary | ICD-10-CM

## 2014-07-25 MED ORDER — BUDESONIDE 90 MCG/ACT IN AEPB: 2.0000 | INHALATION_SPRAY | Freq: Two times a day (BID) | RESPIRATORY_TRACT | Status: DC

## 2014-07-25 MED ORDER — METHYLPREDNISOLONE ACETATE 80 MG/ML IJ SUSP
80.0000 mg | Freq: Once | INTRAMUSCULAR | Status: AC
  Administered 2014-07-25: 80 mg via INTRAMUSCULAR

## 2014-07-25 NOTE — Progress Notes (Signed)
[name], MD Phone: [phone]  Subjective:   Joseph Mejia is a 68 y.o. year old very pleasant male patient who presents with the following:  Cough/congestion Congestion started about 4 days ago and included cough. Developed wheeze and worsening cough within next day. Does have some greenish sputum. He is having some aching in chest and upper back from coughing so much. Does feel tight throughout his rib cage as is typical with his asthma when it flares. Says had very similar symptoms in April and was started on pulmicort after a depo-medrol shot which improved his symptoms. He states he is now only taking his pulmicort once a day since he was doing so well. 2-3 weeksa go would rarely take albuterol but over last few days has taken up to 4x. The albuterol is the most helpful thing for his symptoms at this time.  ROS- no fever/chills/nausea/vomiting. No unilateral leg swelling.   Past Medical History- chronic low dose steroids at 41m for ulcerative colitis, OA, ADD, erectile dysfunction, HLD, HTN, depression, GERD  Medications- reviewed and updated Current Outpatient Prescriptions  Medication Sig Dispense Refill  . albuterol (PROAIR HFA) 108 (90 BASE) MCG/ACT inhaler Inhale 2 puffs into the lungs 4 (four) times daily.  2 Inhaler  6  . ALPRAZolam (XANAX) 0.5 MG tablet Take 0.5 tablets (0.25 mg total) by mouth daily.  15 tablet  0  . amphetamine-dextroamphetamine (ADDERALL XR) 10 MG 24 hr capsule Take 1 capsule (10 mg total) by mouth every morning.  30 capsule  0  . amphetamine-dextroamphetamine (ADDERALL XR) 10 MG 24 hr capsule Take 1 capsule (10 mg total) by mouth every morning.  30 capsule  0  . atorvastatin (LIPITOR) 40 MG tablet TAKE 1 TABLET (40 MG TOTAL) BY MOUTH DAILY.  90 tablet  3  . Budesonide (PULMICORT FLEXHALER) 90 MCG/ACT inhaler Inhale 2 puffs into the lungs 2 (two) times daily.  1 Inhaler  5  . buPROPion (WELLBUTRIN SR) 150 MG 12 hr tablet TAKE 1 TABLET BY MOUTH  EVERY MORNING  30 tablet  3  . Calcium Carbonate-Vitamin D (CALTRATE 600+D) 600-400 MG-UNIT per tablet Take 1 tablet by mouth daily.      . Cyanocobalamin (VITAMIN B 12 PO) Take 5,000 mcg by mouth daily.      . fexofenadine (ALLEGRA) 180 MG tablet Take 180 mg by mouth daily as needed.      . GuaiFENesin (CHEST CONGESTION RELIEF PO) Take 2 tablets by mouth as needed.      . hyoscyamine (LEVBID) 0.375 MG 12 hr tablet TAKE 1 TABLET BY MOUTH EVERY 12 HOURS AS NEEDED FOR CRAMPING  60 tablet  2  . lisinopril (PRINIVIL,ZESTRIL) 10 MG tablet TAKE 1 TABLET (10 MG TOTAL) BY MOUTH DAILY.  30 tablet  2  . Multiple Vitamin (MULTIVITAMIN) tablet Take 1 tablet by mouth daily.        .Marland Kitchenomeprazole (PRILOSEC) 20 MG capsule TAKE 1 CAPSULE (20 MG TOTAL) BY MOUTH DAILY.  30 capsule  12  . predniSONE (DELTASONE) 1 MG tablet TAKE TWO TABLETS DAILY  60 tablet  2  . zolpidem (AMBIEN) 10 MG tablet TAKE 1 TABLET(S) BY MOUTH EVERY NIGHT AT BEDTIME AS NEEDED FOR SLEEP  30 tablet  3   No current facility-administered medications for this visit.    Objective: BP 110/80  Pulse 64  Temp(Src) 98 F (36.7 C)  Wt 142 lb (64.411 kg) Gen: NAD, resting comfortably on table, does not appear in respiratory distress CV:  RRR no murmurs rubs or gallops Lungs: diffuse wheeze but with good air movement Ext: no edema Skin: warm, dry, no rash   Assessment/Plan:  ASTHMA Exacerbation likely triggered by URI. Poor control also likely due to once daily pulmicort instead of BID. encoruaged BID use. Depo medrol in office today. Albuterol q4-6 hours for next 24 hours then prn. Follow up if not improving or if worsens.    Meds ordered this encounter  Medications  . Budesonide (PULMICORT FLEXHALER) 90 MCG/ACT inhaler    Sig: Inhale 2 puffs into the lungs 2 (two) times daily.    Dispense:  1 Inhaler    Refill:  5  . methylPREDNISolone acetate (DEPO-MEDROL) injection 80 mg    Sig:

## 2014-07-25 NOTE — Patient Instructions (Signed)
Asthma flare  To prevent asthma flares: Pulmicort 2 puffs twice daily (every day even when feeling well)  To help with current flare: steroid shot (depo medrol), take albuterol every 4-6 hours for next 24 hours  Call or return to clinic as needed if these symptoms worsen or fail to improve as anticipated (within 1-2 days).  If doing much better, would encourage a check in with Dr. Raliegh Ip in the next 3 months

## 2014-07-25 NOTE — Assessment & Plan Note (Signed)
Exacerbation likely triggered by URI. Poor control also likely due to once daily pulmicort instead of BID. encoruaged BID use. Depo medrol in office today. Albuterol q4-6 hours for next 24 hours then prn. Follow up if not improving or if worsens.

## 2014-07-26 ENCOUNTER — Other Ambulatory Visit: Payer: Managed Care, Other (non HMO)

## 2014-07-28 ENCOUNTER — Encounter: Payer: Managed Care, Other (non HMO) | Admitting: Internal Medicine

## 2014-08-04 ENCOUNTER — Other Ambulatory Visit: Payer: Self-pay | Admitting: Internal Medicine

## 2014-08-11 ENCOUNTER — Encounter: Payer: Managed Care, Other (non HMO) | Admitting: Internal Medicine

## 2014-08-18 ENCOUNTER — Encounter: Payer: Self-pay | Admitting: Internal Medicine

## 2014-08-18 ENCOUNTER — Ambulatory Visit (INDEPENDENT_AMBULATORY_CARE_PROVIDER_SITE_OTHER): Payer: Managed Care, Other (non HMO) | Admitting: Internal Medicine

## 2014-08-18 ENCOUNTER — Encounter: Payer: Self-pay | Admitting: *Deleted

## 2014-08-18 VITALS — BP 132/70 | HR 65 | Temp 97.8°F | Resp 20 | Ht 65.5 in | Wt 142.0 lb

## 2014-08-18 DIAGNOSIS — I10 Essential (primary) hypertension: Secondary | ICD-10-CM

## 2014-08-18 DIAGNOSIS — Z23 Encounter for immunization: Secondary | ICD-10-CM

## 2014-08-18 DIAGNOSIS — M949 Disorder of cartilage, unspecified: Secondary | ICD-10-CM

## 2014-08-18 DIAGNOSIS — J45909 Unspecified asthma, uncomplicated: Secondary | ICD-10-CM

## 2014-08-18 DIAGNOSIS — K5100 Ulcerative (chronic) pancolitis without complications: Secondary | ICD-10-CM

## 2014-08-18 DIAGNOSIS — F988 Other specified behavioral and emotional disorders with onset usually occurring in childhood and adolescence: Secondary | ICD-10-CM

## 2014-08-18 DIAGNOSIS — M899 Disorder of bone, unspecified: Secondary | ICD-10-CM

## 2014-08-18 DIAGNOSIS — Z0000 Encounter for general adult medical examination without abnormal findings: Secondary | ICD-10-CM

## 2014-08-18 MED ORDER — AMPHETAMINE-DEXTROAMPHET ER 20 MG PO CP24: 20.0000 mg | ORAL_CAPSULE | Freq: Every day | ORAL | Status: DC

## 2014-08-18 MED ORDER — ALPRAZOLAM 0.5 MG PO TABS: 0.2500 mg | ORAL_TABLET | Freq: Every day | ORAL | Status: DC

## 2014-08-18 NOTE — Progress Notes (Signed)
Subjective:    Patient ID: Joseph Mejia, male    DOB: 1946/06/14, 68 y.o.   MRN: [id number]  HPI    Patient ID: Joseph Mejia, male   DOB: August 17, 1946, 68 y.o.   MRN: [id number]  Subjective:    Patient ID: Joseph Mejia, male    DOB: 01-31-1946, 68 y.o.   MRN: [id number]  HPI  68 year old patient who is in today for a wellness exam.  He is followed by GI due to universal colitis. His main complaints today are arthritic mainly involving the small joints of the hands left greater than right. He also has had some mild Dupuytren's contractures involving the right hand. He has had C-spine surgery in November of last year and as well as a right-sided carpal toe release in December of last year. He has a history of anxiety and ADHD and is now followed by Dr. Cheryln Manly. He is doing quite well after a recent retirement and feels has lost some of its effectiveness.  He is on a modest 10 mg dose He was seen last month for asthma exacerbation, which has been stable  Past Medical History  Diagnosis Date  . HYPERLIPIDEMIA 08/03/2007  . ANXIETY 08/03/2007  . DEPRESSION 08/03/2007  . ADD 02/23/2008  . HYPERTENSION 08/03/2007  . ASTHMA 08/03/2007  . GERD 08/03/2007  . UNIVERSAL ULCERATIVE COLITIS 01/26/2009  . Irritable bowel syndrome 11/02/2008  . RENAL DISEASE, CHRONIC, MILD 11/04/2008  . ERECTILE DYSFUNCTION, ORGANIC 02/23/2008  . OSTEOARTHRITIS 11/02/2008  . HIP PAIN, RIGHT 05/27/2008  . HAND PAIN, LEFT 06/20/2009  . OSTEOPENIA 07/26/2008  . CHEST PAIN 11/17/2008  . Closed fracture of four ribs 06/30/2008  . Failed moderate sedation during procedure 11/02/2008    History   Social History  . Marital Status: Married    Spouse Name: N/A    Number of Children: N/A  . Years of Education: N/A   Occupational History  . Not on file.   Social History Main Topics  . Smoking status: Never Smoker   . Smokeless tobacco: Never Used  . Alcohol Use: 4.2 oz/week    7 Glasses of wine per week  . Drug  Use: No  . Sexual Activity: Not on file   Other Topics Concern  . Not on file   Social History Narrative  . No narrative on file    Past Surgical History  Procedure Laterality Date  . Elbow surgery  01/2002    left  . Shoulder surgery  09/2007    injury at work, dislocated  . Incision and drainage perirectal abscess  1998  . Lumbar disc surgery  1992  . Hand surgery  02/2010    basal thumb joint removal and tendon transplant([facility])  . Colonoscopy w/ biopsies  06/2001, 08/2006, 07/2008    colon polyp, ulcerative colitis, lymphoid aggregates  . Upper gastrointestinal endoscopy  06/2001    2cm hiatal hernia  . Carpal tunnel release  2012  . Anterior fusion cervical spine  2012  . Basal cell carcinoma excision Left     ear, head    Family History  Problem Relation Age of Onset  . Heart disease Mother     s/p CABG age 43  . Cancer Father     colon , brain  . Colon cancer Father   . COPD Sister   . Heart disease Brother     CAD  . Seizures Brother 31    Allergies  Allergen Reactions  . Aspirin  REACTION: unspecified  . Mercaptopurine     REACTION: myalgias  . Procaine Hcl     REACTION: MAKES HEART RACE    Current Outpatient Prescriptions on File Prior to Visit  Medication Sig Dispense Refill  . albuterol (PROAIR HFA) 108 (90 BASE) MCG/ACT inhaler Inhale 2 puffs into the lungs 4 (four) times daily.  2 Inhaler  6  . amphetamine-dextroamphetamine (ADDERALL XR) 10 MG 24 hr capsule Take 1 capsule (10 mg total) by mouth every morning.  30 capsule  0  . amphetamine-dextroamphetamine (ADDERALL XR) 10 MG 24 hr capsule Take 1 capsule (10 mg total) by mouth every morning.  30 capsule  0  . atorvastatin (LIPITOR) 40 MG tablet TAKE 1 TABLET (40 MG TOTAL) BY MOUTH DAILY.  90 tablet  3  . Budesonide (PULMICORT FLEXHALER) 90 MCG/ACT inhaler Inhale 2 puffs into the lungs 2 (two) times daily.  1 Inhaler  5  . buPROPion (WELLBUTRIN SR) 150 MG 12 hr tablet TAKE 1 TABLET BY MOUTH EVERY  MORNING  30 tablet  3  . Calcium Carbonate-Vitamin D (CALTRATE 600+D) 600-400 MG-UNIT per tablet Take 1 tablet by mouth daily.      . Cyanocobalamin (VITAMIN B 12 PO) Take 5,000 mcg by mouth daily.      . fexofenadine (ALLEGRA) 180 MG tablet Take 180 mg by mouth daily as needed.      . GuaiFENesin (CHEST CONGESTION RELIEF PO) Take 2 tablets by mouth as needed.      . hyoscyamine (LEVBID) 0.375 MG 12 hr tablet TAKE 1 TABLET BY MOUTH EVERY 12 HOURS AS NEEDED FOR CRAMPING  60 tablet  2  . lisinopril (PRINIVIL,ZESTRIL) 10 MG tablet TAKE 1 TABLET (10 MG TOTAL) BY MOUTH DAILY.  30 tablet  5  . Multiple Vitamin (MULTIVITAMIN) tablet Take 1 tablet by mouth daily.        Marland Kitchen omeprazole (PRILOSEC) 20 MG capsule TAKE 1 CAPSULE (20 MG TOTAL) BY MOUTH DAILY.  30 capsule  12  . predniSONE (DELTASONE) 1 MG tablet TAKE TWO TABLETS DAILY  60 tablet  2  . zolpidem (AMBIEN) 10 MG tablet TAKE 1 TABLET(S) BY MOUTH EVERY NIGHT AT BEDTIME AS NEEDED FOR SLEEP  30 tablet  3   No current facility-administered medications on file prior to visit.    BP 132/70  Pulse 65  Temp(Src) 97.8 F (36.6 C) (Oral)  Resp 20  Ht 5' 5.5" (1.664 m)  Wt 142 lb (64.411 kg)  BMI 23.26 kg/m2  SpO2 97%  '      Review of Systems  Constitutional: Negative for fever, chills, activity change, appetite change and fatigue.  HENT: Negative for hearing loss, ear pain, congestion, rhinorrhea, sneezing, mouth sores, trouble swallowing, neck pain, neck stiffness, dental problem, voice change, sinus pressure and tinnitus.   Eyes: Negative for photophobia, pain, redness and visual disturbance.  Respiratory: Negative for apnea, cough, choking, chest tightness, shortness of breath and wheezing.   Cardiovascular: Negative for chest pain, palpitations and leg swelling.  Gastrointestinal: Negative for nausea, vomiting, abdominal pain, diarrhea, constipation, blood in stool, abdominal distention, anal bleeding and rectal pain.  Genitourinary:  Negative for dysuria, urgency, frequency, hematuria, flank pain, decreased urine volume, discharge, penile swelling, scrotal swelling, difficulty urinating, genital sores and testicular pain.  Musculoskeletal: Positive for myalgias and arthralgias. Negative for back pain, joint swelling and gait problem.  Skin: Negative for color change, rash and wound.  Neurological: Negative for dizziness, tremors, seizures, syncope, facial asymmetry, speech difficulty, weakness,  light-headedness, numbness and headaches.  Hematological: Negative for adenopathy. Does not bruise/bleed easily.  Psychiatric/Behavioral: Negative for suicidal ideas, hallucinations, behavioral problems, confusion, sleep disturbance, self-injury, dysphoric mood, decreased concentration and agitation. The patient is not nervous/anxious.        Objective:   Physical Exam  Constitutional: He appears well-developed and well-nourished.  HENT:  Head: Normocephalic and atraumatic.  Right Ear: External ear normal.  Left Ear: External ear normal.  Nose: Nose normal.  Mouth/Throat: Oropharynx is clear and moist.  Eyes: Conjunctivae and EOM are normal. Pupils are equal, round, and reactive to light. No scleral icterus.  Neck: Normal range of motion. Neck supple. No JVD present. No thyromegaly present.  Cardiovascular: Regular rhythm and intact distal pulses.  Exam reveals no gallop and no friction rub.   Murmur heard. Grade 2/6 systolic murmur  Pulmonary/Chest: Effort normal and breath sounds normal. He exhibits no tenderness.  Abdominal: Soft. Bowel sounds are normal. He exhibits no distension and no mass. There is no tenderness.  Genitourinary: Prostate normal and penis normal. Guaiac negative stool.  Prostate +2 enlarged  Musculoskeletal: Normal range of motion. He exhibits no edema and no tenderness.  Lymphadenopathy:    He has no cervical adenopathy.  Neurological: He is alert. He has normal reflexes. No cranial nerve deficit.  Coordination normal.  Skin: Skin is warm and dry. No rash noted.  Psychiatric: He has a normal mood and affect. His behavior is normal.          Assessment & Plan:   Preventive health exam Ulcerative colitis Hypertension stable. Blood pressure remains in a low-normal range we'll challenge off lisinopril Dyslipidemia. We'll continue atorvastatin 40 Osteoarthritis adhd. Continue Adderall  Recheck 6 months  Review of Systems As above    Objective:   Physical Exam   asd above     Assessment & Plan:   Preventive health examination A D. D.  Will increase Adderall to 20 mg daily and observe.  Will refer back to Dr. Cheryln Manly if not doing well on this regimen History of asthma exacerbation, resolved Hypertension stable Ulcerative colitis.  Followup colonoscopy in 3 years Chronic kidney disease, stable  Recheck 6 months

## 2014-08-18 NOTE — Progress Notes (Signed)
Pre visit review using our clinic review tool, if applicable. No additional management support is needed unless otherwise documented below in the visit note. 

## 2014-08-18 NOTE — Patient Instructions (Signed)
Limit your sodium (Salt) intake    It is important that you exercise regularly, at least 20 minutes 3 to 4 times per week.  If you develop chest pain or shortness of breath seek  medical attention.  Health Maintenance A healthy lifestyle and preventative care can promote health and wellness.  Maintain regular health, dental, and eye exams.  Eat a healthy diet. Foods like vegetables, fruits, whole grains, low-fat dairy products, and lean protein foods contain the nutrients you need and are low in calories. Decrease your intake of foods high in solid fats, added sugars, and salt. Get information about a proper diet from your health care provider, if necessary.  Regular physical exercise is one of the most important things you can do for your health. Most adults should get at least 150 minutes of moderate-intensity exercise (any activity that increases your heart rate and causes you to sweat) each week. In addition, most adults need muscle-strengthening exercises on 2 or more days a week.   Maintain a healthy weight. The body mass index (BMI) is a screening tool to identify possible weight problems. It provides an estimate of body fat based on height and weight. Your health care provider can find your BMI and can help you achieve or maintain a healthy weight. For males 20 years and older:  A BMI below 18.5 is considered underweight.  A BMI of 18.5 to 24.9 is normal.  A BMI of 25 to 29.9 is considered overweight.  A BMI of 30 and above is considered obese.  Maintain normal blood lipids and cholesterol by exercising and minimizing your intake of saturated fat. Eat a balanced diet with plenty of fruits and vegetables. Blood tests for lipids and cholesterol should begin at age 60 and be repeated every 5 years. If your lipid or cholesterol levels are high, you are over age 22, or you are at high risk for heart disease, you may need your cholesterol levels checked more frequently.Ongoing high lipid  and cholesterol levels should be treated with medicines if diet and exercise are not working.  If you smoke, find out from your health care provider how to quit. If you do not use tobacco, do not start.  Lung cancer screening is recommended for adults aged 67-80 years who are at high risk for developing lung cancer because of a history of smoking. A yearly low-dose CT scan of the lungs is recommended for people who have at least a 30-pack-year history of smoking and are current smokers or have quit within the past 15 years. A pack year of smoking is smoking an average of 1 pack of cigarettes a day for 1 year (for example, a 30-pack-year history of smoking could mean smoking 1 pack a day for 30 years or 2 packs a day for 15 years). Yearly screening should continue until the smoker has stopped smoking for at least 15 years. Yearly screening should be stopped for people who develop a health problem that would prevent them from having lung cancer treatment.  If you choose to drink alcohol, do not have more than 2 drinks per day. One drink is considered to be 12 oz (360 mL) of beer, 5 oz (150 mL) of wine, or 1.5 oz (45 mL) of liquor.  Avoid the use of street drugs. Do not share needles with anyone. Ask for help if you need support or instructions about stopping the use of drugs.  High blood pressure causes heart disease and increases the risk of stroke.  Blood pressure should be checked at least every 1-2 years. Ongoing high blood pressure should be treated with medicines if weight loss and exercise are not effective.  If you are 38-58 years old, ask your health care provider if you should take aspirin to prevent heart disease.  Diabetes screening involves taking a blood sample to check your fasting blood sugar level. This should be done once every 3 years after age 75 if you are at a normal weight and without risk factors for diabetes. Testing should be considered at a younger age or be carried out more  frequently if you are overweight and have at least 1 risk factor for diabetes.  Colorectal cancer can be detected and often prevented. Most routine colorectal cancer screening begins at the age of 33 and continues through age 61. However, your health care provider may recommend screening at an earlier age if you have risk factors for colon cancer. On a yearly basis, your health care provider may provide home test kits to check for hidden blood in the stool. A small camera at the end of a tube may be used to directly examine the colon (sigmoidoscopy or colonoscopy) to detect the earliest forms of colorectal cancer. Talk to your health care provider about this at age 82 when routine screening begins. A direct exam of the colon should be repeated every 5-10 years through age 62, unless early forms of precancerous polyps or small growths are found.  People who are at an increased risk for hepatitis B should be screened for this virus. You are considered at high risk for hepatitis B if:  You were born in a country where hepatitis B occurs often. Talk with your health care provider about which countries are considered high risk.  Your parents were born in a high-risk country and you have not received a shot to protect against hepatitis B (hepatitis B vaccine).  You have HIV or AIDS.  You use needles to inject street drugs.  You live with, or have sex with, someone who has hepatitis B.  You are a man who has sex with other men (MSM).  You get hemodialysis treatment.  You take certain medicines for conditions like cancer, organ transplantation, and autoimmune conditions.  Hepatitis C blood testing is recommended for all people born from [birth year] through 1965 and any individual with known risk factors for hepatitis C.  Healthy men should no longer receive prostate-specific antigen (PSA) blood tests as part of routine cancer screening. Talk to your health care provider about prostate cancer  screening.  Testicular cancer screening is not recommended for adolescents or adult males who have no symptoms. Screening includes self-exam, a health care provider exam, and other screening tests. Consult with your health care provider about any symptoms you have or any concerns you have about testicular cancer.  Practice safe sex. Use condoms and avoid high-risk sexual practices to reduce the spread of sexually transmitted infections (STIs).  You should be screened for STIs, including gonorrhea and chlamydia if:  You are sexually active and are younger than 24 years.  You are older than 24 years, and your health care provider tells you that you are at risk for this type of infection.  Your sexual activity has changed since you were last screened, and you are at an increased risk for chlamydia or gonorrhea. Ask your health care provider if you are at risk.  If you are at risk of being infected with HIV, it is recommended that you  take a prescription medicine daily to prevent HIV infection. This is called pre-exposure prophylaxis (PrEP). You are considered at risk if:  You are a man who has sex with other men (MSM).  You are a heterosexual man who is sexually active with multiple partners.  You take drugs by injection.  You are sexually active with a partner who has HIV.  Talk with your health care provider about whether you are at high risk of being infected with HIV. If you choose to begin PrEP, you should first be tested for HIV. You should then be tested every 3 months for as long as you are taking PrEP.  Use sunscreen. Apply sunscreen liberally and repeatedly throughout the day. You should seek shade when your shadow is shorter than you. Protect yourself by wearing long sleeves, pants, a wide-brimmed hat, and sunglasses year round whenever you are outdoors.  Tell your health care provider of new moles or changes in moles, especially if there is a change in shape or color. Also, tell  your health care provider if a mole is larger than the size of a pencil eraser.  A one-time screening for abdominal aortic aneurysm (AAA) and surgical repair of large AAAs by ultrasound is recommended for men aged 22-75 years who are current or former smokers.  Stay current with your vaccines (immunizations). Document Released: 06/13/2008 Document Revised: 12/21/2013 Document Reviewed: 05/13/2011 [facility] Patient Information 2015 [place], [place]. This information is not intended to replace advice given to you by your health care provider. Make sure you discuss any questions you have with your health care provider. Cardiac Diet This diet can help prevent heart disease and stroke. Many factors influence your heart health, including eating and exercise habits. Coronary risk rises a lot with abnormal blood fat (lipid) levels. Cardiac meal planning includes limiting unhealthy fats, increasing healthy fats, and making other small dietary changes. General guidelines are as follows:  Adjust calorie intake to reach and maintain desirable body weight.  Limit total fat intake to less than 30% of total calories. Saturated fat should be less than 7% of calories.  Saturated fats are found in animal products and in some vegetable products. Saturated vegetable fats are found in coconut oil, cocoa butter, palm oil, and palm kernel oil. Read labels carefully to avoid these products as much as possible. Use butter in moderation. Choose tub margarines and oils that have 2 grams of fat or less. Good cooking oils are canola and olive oils.  Practice low-fat cooking techniques. Do not fry food. Instead, broil, bake, boil, steam, grill, roast on a rack, stir-fry, or microwave it. Other fat reducing suggestions include:  Remove the skin from poultry.  Remove all visible fat from meats.  Skim the fat off stews, soups, and gravies before serving them.  Steam vegetables in water or broth instead of sauting them in  fat.  Avoid foods with trans fat (or hydrogenated oils), such as commercially fried foods and commercially baked goods. Commercial shortening and deep-frying fats will contain trans fat.  Increase intake of fruits, vegetables, whole grains, and legumes to replace foods high in fat.  Increase consumption of nuts, legumes, and seeds to at least 4 servings weekly. One serving of a legume equals  cup, and 1 serving of nuts or seeds equals  cup.  Choose whole grains more often. Have 3 servings per day (a serving is 1 ounce [oz]).  Eat 4 to 5 servings of vegetables per day. A serving of vegetables is 1  cup of raw leafy vegetables;  cup of raw or cooked cut-up vegetables;  cup of vegetable juice.  Eat 4 to 5 servings of fruit per day. A serving of fruit is 1 medium whole fruit;  cup of dried fruit;  cup of fresh, frozen, or canned fruit;  cup of 100% fruit juice.  Increase your intake of dietary fiber to 20 to 30 grams per day. Insoluble fiber may help lower your risk of heart disease and may help curb your appetite.  Soluble fiber binds cholesterol to be removed from the blood. Foods high in soluble fiber are dried beans, citrus fruits, oats, apples, bananas, broccoli, Brussels sprouts, and eggplant.  Try to include foods fortified with plant sterols or stanols, such as yogurt, breads, juices, or margarines. Choose several fortified foods to achieve a daily intake of 2 to 3 grams of plant sterols or stanols.  Foods with omega-3 fats can help reduce your risk of heart disease. Aim to have a 3.5 oz portion of fatty fish twice per week, such as salmon, mackerel, albacore tuna, sardines, lake trout, or herring. If you wish to take a fish oil supplement, choose one that contains 1 gram of both DHA and EPA.  Limit processed meats to 2 servings (3 oz portion) weekly.  Limit the sodium in your diet to 1500 milligrams (mg) per day. If you have high blood pressure, talk to a registered dietitian about  a DASH (Dietary Approaches to Stop Hypertension) eating plan.  Limit sweets and beverages with added sugar, such as soda, to no more than 5 servings per week. One serving is:   1 tablespoon sugar.  1 tablespoon jelly or jam.   cup sorbet.  1 cup lemonade.   cup regular soda. CHOOSING FOODS Starches  Allowed: Breads: All kinds (wheat, rye, raisin, white, oatmeal, [place], [place], and English muffin bread). Low-fat rolls: English muffins, frankfurter and hamburger buns, bagels, pita bread, tortillas (not fried). Pancakes, waffles, biscuits, and muffins made with recommended oil.  Avoid: Products made with saturated or trans fats, oils, or whole milk products. Butter rolls, cheese breads, croissants. Commercial doughnuts, muffins, sweet rolls, biscuits, waffles, pancakes, store-bought mixes. Crackers  Allowed: Low-fat crackers and snacks: Animal, graham, rye, saltine (with recommended oil, no lard), oyster, and matzo crackers. Bread sticks, melba toast, rusks, flatbread, pretzels, and light popcorn.  Avoid: High-fat crackers: cheese crackers, butter crackers, and those made with coconut, palm oil, or trans fat (hydrogenated oils). Buttered popcorn. Cereals  Allowed: Hot or cold whole-grain cereals.  Avoid: Cereals containing coconut, hydrogenated vegetable fat, or animal fat. Potatoes / Pasta / Rice  Allowed: All kinds of potatoes, rice, and pasta (such as macaroni, spaghetti, and noodles).  Avoid: Pasta or rice prepared with cream sauce or high-fat cheese. Chow mein noodles, [place] fries. Vegetables  Allowed: All vegetables and vegetable juices.  Avoid: Fried vegetables. Vegetables in cream, butter, or high-fat cheese sauces. Limit coconut. Fruit in cream or custard. Protein  Allowed: Limit your intake of meat, seafood, and poultry to no more than 6 oz (cooked weight) per day. All lean, well-trimmed beef, veal, pork, and lamb. All chicken and [place] without skin. All fish  and shellfish. Wild game: wild duck, rabbit, pheasant, and venison. Egg whites or low-cholesterol egg substitutes may be used as desired. Meatless dishes: recipes with dried beans, peas, lentils, and tofu (soybean curd). Seeds and nuts: all seeds and most nuts.  Avoid: Prime grade and other heavily marbled and fatty meats, such as short  ribs, spare ribs, rib eye roast or steak, frankfurters, sausage, bacon, and high-fat luncheon meats, mutton. Caviar. Commercially fried fish. Domestic duck, goose, venison sausage. Organ meats: liver, gizzard, heart, chitterlings, brains, kidney, sweetbreads. Dairy  Allowed: Low-fat cheeses: nonfat or low-fat cottage cheese (1% or 2% fat), cheeses made with part skim milk, such as mozzarella, farmers, string, or ricotta. (Cheeses should be labeled no more than 2 to 6 grams fat per oz.). Skim (or 1%) milk: liquid, powdered, or evaporated. Buttermilk made with low-fat milk. Drinks made with skim or low-fat milk or cocoa. Chocolate milk or cocoa made with skim or low-fat (1%) milk. Nonfat or low-fat yogurt.  Avoid: Whole milk cheeses, including colby, cheddar, [place], Monterey Jack, [place], [employer], [place], American, Swiss, and blue. Creamed cottage cheese, cream cheese. Whole milk and whole milk products, including buttermilk or yogurt made from whole milk, drinks made from whole milk. Condensed milk, evaporated whole milk, and 2% milk. Soups and Combination Foods  Allowed: Low-fat low-sodium soups: broth, dehydrated soups, homemade broth, soups with the fat removed, homemade cream soups made with skim or low-fat milk. Low-fat spaghetti, lasagna, chili, and Spanish rice if low-fat ingredients and low-fat cooking techniques are used.  Avoid: Cream soups made with whole milk, cream, or high-fat cheese. All other soups. Desserts and Sweets  Allowed: Sherbet, fruit ices, gelatins, meringues, and angel food cake. Homemade desserts with recommended fats, oils, and milk  products. Jam, jelly, honey, marmalade, sugars, and syrups. Pure sugar candy, such as gum drops, hard candy, jelly beans, marshmallows, mints, and small amounts of dark chocolate.  Avoid: Commercially prepared cakes, pies, cookies, frosting, pudding, or mixes for these products. Desserts containing whole milk products, chocolate, coconut, lard, palm oil, or palm kernel oil. Ice cream or ice cream drinks. Candy that contains chocolate, coconut, butter, hydrogenated fat, or unknown ingredients. Buttered syrups. Fats and Oils  Allowed: Vegetable oils: safflower, sunflower, corn, soybean, cottonseed, sesame, canola, olive, or peanut. Non-hydrogenated margarines. Salad dressing or mayonnaise: homemade or commercial, made with a recommended oil. Low or nonfat salad dressing or mayonnaise.  Limit added fats and oils to 6 to 8 tsp per day (includes fats used in cooking, baking, salads, and spreads on bread). Remember to count the "hidden fats" in foods.  Avoid: Solid fats and shortenings: butter, lard, salt pork, bacon drippings. Gravy containing meat fat, shortening, or suet. Cocoa butter, coconut. Coconut oil, palm oil, palm kernel oil, or hydrogenated oils: these ingredients are often used in bakery products, nondairy creamers, whipped toppings, candy, and commercially fried foods. Read labels carefully. Salad dressings made of unknown oils, sour cream, or cheese, such as blue cheese and Roquefort. Cream, all kinds: half-and-half, light, heavy, or whipping. Sour cream or cream cheese (even if "light" or low-fat). Nondairy cream substitutes: coffee creamers and sour cream substitutes made with palm, palm kernel, hydrogenated oils, or coconut oil. Beverages  Allowed: Coffee (regular or decaffeinated), tea. Diet carbonated beverages, mineral water. Alcohol: Check with your caregiver. Moderation is recommended.  Avoid: Whole milk, regular sodas, and juice drinks with added sugar. Condiments  Allowed: All  seasonings and condiments. Cocoa powder. "Cream" sauces made with recommended ingredients.  Avoid: Carob powder made with hydrogenated fats. SAMPLE MENU Breakfast   cup orange juice   cup oatmeal  1 slice toast  1 tsp margarine  1 cup skim milk Lunch  [place] sandwich with 2 oz [place], 2 slices bread  Lettuce and tomato slices  Fresh fruit  Carrot sticks  Coffee or  tea Snack  Fresh fruit or low-fat crackers Dinner  3 oz lean ground beef  1 baked potato  1 tsp margarine   cup asparagus  Lettuce salad  1 tbs non-creamy dressing   cup peach slices  1 cup skim milk Document Released: 09/24/2008 Document Revised: 06/16/2012 Document Reviewed: 02/15/2014 [employer] Patient Information 2015 [place], [place]. This information is not intended to replace advice given to you by your health care provider. Make sure you discuss any questions you have with your health care provider.

## 2014-08-19 ENCOUNTER — Telehealth: Payer: Self-pay | Admitting: Internal Medicine

## 2014-08-19 NOTE — Telephone Encounter (Signed)
Relevant patient education assigned to patient using Emmi. ° °

## 2014-09-01 ENCOUNTER — Other Ambulatory Visit: Payer: Self-pay | Admitting: Internal Medicine

## 2014-09-08 ENCOUNTER — Other Ambulatory Visit: Payer: Self-pay | Admitting: Internal Medicine

## 2014-09-15 ENCOUNTER — Other Ambulatory Visit: Payer: Self-pay | Admitting: Internal Medicine

## 2014-09-22 ENCOUNTER — Other Ambulatory Visit: Payer: Self-pay | Admitting: Internal Medicine

## 2014-09-23 ENCOUNTER — Encounter: Payer: Self-pay | Admitting: Family Medicine

## 2014-09-23 ENCOUNTER — Ambulatory Visit (INDEPENDENT_AMBULATORY_CARE_PROVIDER_SITE_OTHER): Payer: Managed Care, Other (non HMO) | Admitting: Family Medicine

## 2014-09-23 VITALS — BP 130/72 | HR 60 | Temp 97.5°F | Wt 140.0 lb

## 2014-09-23 DIAGNOSIS — R300 Dysuria: Secondary | ICD-10-CM

## 2014-09-23 DIAGNOSIS — M545 Low back pain, unspecified: Secondary | ICD-10-CM

## 2014-09-23 DIAGNOSIS — R3911 Hesitancy of micturition: Secondary | ICD-10-CM

## 2014-09-23 MED ORDER — CIPROFLOXACIN HCL 500 MG PO TABS: 500.0000 mg | ORAL_TABLET | Freq: Two times a day (BID) | ORAL | Status: DC

## 2014-09-23 NOTE — Progress Notes (Signed)
[name], MD Phone: [phone]  Subjective:   Joseph Mejia is a 68 y.o. year old very pleasant male patient who presents with the following:  Low back pain, slight dysuria, hesitancy/urgency, dribbling Symptoms started Sunday Gradually progressive. Urine very dark. Never had kidney stones. Has had urinary infection before. Never had prostatitis. Back pain 5-6/10 worse with walking. Aching pain in low back area. Due to taste change, admits to not eating as well.  ROS- No fevers/chills. No nausea. Taste change has occurred. No leg pain. No fecal or urinary incontinence. No leg weakness. No discharge.   Social history- No new sexual partners. No concerns for infidelity.  Past Medical History- chronic steroids low dose, ulcerative colitis, CKD III-GFR near 40-45, OA, ADD, ED, HLD, anxiety, asthma, GERD  Medications- reviewed and updated Current Outpatient Prescriptions  Medication Sig Dispense Refill  . ALPRAZolam (XANAX) 0.5 MG tablet Take 0.5 tablets (0.25 mg total) by mouth daily.  30 tablet  2  . amphetamine-dextroamphetamine (ADDERALL XR) 20 MG 24 hr capsule Take 1 capsule (20 mg total) by mouth daily.  30 capsule  0  . atorvastatin (LIPITOR) 40 MG tablet TAKE 1 TABLET (40 MG TOTAL) BY MOUTH DAILY.  30 tablet  2  . Budesonide (PULMICORT FLEXHALER) 90 MCG/ACT inhaler Inhale 2 puffs into the lungs 2 (two) times daily.  1 Inhaler  5  . buPROPion (WELLBUTRIN SR) 150 MG 12 hr tablet TAKE 1 TABLET BY MOUTH EVERY MORNING  30 tablet  3  . Calcium Carbonate-Vitamin D (CALTRATE 600+D) 600-400 MG-UNIT per tablet Take 1 tablet by mouth daily.      . Cyanocobalamin (VITAMIN B 12 PO) Take 5,000 mcg by mouth daily.      . fexofenadine (ALLEGRA) 180 MG tablet Take 180 mg by mouth daily as needed.      . GuaiFENesin (CHEST CONGESTION RELIEF PO) Take 2 tablets by mouth as needed.      . hyoscyamine (LEVBID) 0.375 MG 12 hr tablet TAKE 1 TABLET BY MOUTH EVERY 12 HOURS AS NEEDED FOR CRAMPING  60  tablet  2  . lisinopril (PRINIVIL,ZESTRIL) 10 MG tablet TAKE 1 TABLET (10 MG TOTAL) BY MOUTH DAILY.  30 tablet  5  . lisinopril (PRINIVIL,ZESTRIL) 10 MG tablet TAKE 1 TABLET (10 MG TOTAL) BY MOUTH DAILY.  30 tablet  5  . Multiple Vitamin (MULTIVITAMIN) tablet Take 1 tablet by mouth daily.        Marland Kitchen omeprazole (PRILOSEC) 20 MG capsule TAKE 1 CAPSULE (20 MG TOTAL) BY MOUTH DAILY.  30 capsule  12  . predniSONE (DELTASONE) 1 MG tablet TAKE TWO TABLETS BY MOUTH DAILY  60 tablet  1  . albuterol (PROAIR HFA) 108 (90 BASE) MCG/ACT inhaler Inhale 2 puffs into the lungs 4 (four) times daily.  2 Inhaler  6  . zolpidem (AMBIEN) 10 MG tablet TAKE 1 TABLET(S) BY MOUTH EVERY NIGHT AT BEDTIME AS NEEDED FOR SLEEP  30 tablet  3   No current facility-administered medications for this visit.    Objective: BP 130/72  Pulse 60  Temp(Src) 97.5 F (36.4 C)  Wt 140 lb (63.504 kg) Gen: NAD, slightly uncomfortable in chair, changes around due to some back and discomfort in low groin CV: 2/6 SEM radiates to carotids. Regular rate no rubs or gallops Lungs: CTAB no crackles, wheeze, rhonchi Abdomen: soft/nontender/nondistended/normal bowel sounds. Specifically no suprapubic tenderness.  MSK: patient tender throughout low back. No clear CVA tenderness.  GU: penis and testicular exam normal without  discomfort Rectal: Patient with edematous prostate that is tender in midline but not on sides of gland Skin: warm, dry, no rash   Assessment/Plan:  Low back pain, slight dysuria, hesitancy/urgency With tender, edematous prostate, I am concerned for prostatitis. Low likelihood STD as one sexual partner over 10 years-no testing done. UA unremarkable before prostate exam but still sent for culture. Cipro x 4 weeks prescribed but with follow up in 1 week. Could consider reduced course if symptoms completley resolve. If needs prolonged therapy, could consider 250mg  BID instead of 500mg  due to GFR around 40 (could split pills  prescribed).

## 2014-09-23 NOTE — Patient Instructions (Signed)
I am concerned this could be a prostate infection. Your urine does not look like plain UTI. Sent for culture. I sent you 4 weeks worth of antibiotics but if this is too expensive call and I can send in just 7 days. Check in next week from now with Dr. Raliegh Ip to determine if you need full course or not.   Thanks, [name]

## 2014-09-25 LAB — URINE CULTURE
Colony Count: NO GROWTH
Organism ID, Bacteria: NO GROWTH

## 2014-10-03 ENCOUNTER — Ambulatory Visit (INDEPENDENT_AMBULATORY_CARE_PROVIDER_SITE_OTHER): Payer: Managed Care, Other (non HMO) | Admitting: Internal Medicine

## 2014-10-03 ENCOUNTER — Encounter: Payer: Self-pay | Admitting: Internal Medicine

## 2014-10-03 VITALS — BP 150/80 | HR 60 | Temp 97.8°F | Resp 18 | Ht 65.5 in | Wt 141.0 lb

## 2014-10-03 DIAGNOSIS — I10 Essential (primary) hypertension: Secondary | ICD-10-CM

## 2014-10-03 DIAGNOSIS — N410 Acute prostatitis: Secondary | ICD-10-CM

## 2014-10-03 MED ORDER — AMPHETAMINE-DEXTROAMPHET ER 20 MG PO CP24: 20.0000 mg | ORAL_CAPSULE | Freq: Every day | ORAL | Status: DC

## 2014-10-03 NOTE — Progress Notes (Signed)
Pre visit review using our clinic review tool, if applicable. No additional management support is needed unless otherwise documented below in the visit note. 

## 2014-10-03 NOTE — Patient Instructions (Signed)
Prostatitis Prostatitis is redness, soreness, and puffiness (swelling) of the prostate gland. The prostate gland is the walnut-sized gland located just below your bladder. HOME CARE:   Take all medicines as told by your doctor.  Take warm-water baths (sitz baths) as told by your doctor. GET HELP IF:  Your symptoms get worse, not better.  You have a fever. GET HELP RIGHT AWAY IF:   You have chills.  You feel sick to your stomach (nauseous) or like you will throw up (vomit).  You feel lightheaded or like you will pass out (faint).  You are unable to pee (urinate).  You have blood or blood clumps (clots) in your pee (urine). MAKE SURE YOU:  Understand these instructions.  Will watch your condition.  Will get help right away if you are not doing well or get worse. Document Released: 06/16/2012 Document Revised: 08/18/2013 Document Reviewed: 07/05/2013 [facility] Patient Information 2015 [place], [place]. This information is not intended to replace advice given to you by your health care provider. Make sure you discuss any questions you have with your health care provider.   Take your antibiotic as prescribed until ALL of it is gone, but stop if you develop a rash, swelling, or any side effects of the medication.  Contact our office as soon as possible if  there are side effects of the medication.

## 2014-10-03 NOTE — Progress Notes (Signed)
Subjective:    Patient ID: Joseph Mejia, male    DOB: 02-03-1946, 68 y.o.   MRN: [id number]  HPI  68 year old patient who is seen today for followup of acute prostatitis.  He is much improved.  He has been on Cipro twice daily, which he continues to tolerate well.  No prior history of prostatitis Urinalysis and urine culture negative.  Clinical exam earlier revealed a tender, swollen, prostate  Past Medical History  Diagnosis Date  . HYPERLIPIDEMIA 08/03/2007  . ANXIETY 08/03/2007  . DEPRESSION 08/03/2007  . ADD 02/23/2008  . HYPERTENSION 08/03/2007  . ASTHMA 08/03/2007  . GERD 08/03/2007  . UNIVERSAL ULCERATIVE COLITIS 01/26/2009  . Irritable bowel syndrome 11/02/2008  . RENAL DISEASE, CHRONIC, MILD 11/04/2008  . ERECTILE DYSFUNCTION, ORGANIC 02/23/2008  . OSTEOARTHRITIS 11/02/2008  . HIP PAIN, RIGHT 05/27/2008  . HAND PAIN, LEFT 06/20/2009  . OSTEOPENIA 07/26/2008  . CHEST PAIN 11/17/2008  . Closed fracture of four ribs 06/30/2008  . Failed moderate sedation during procedure 11/02/2008    History   Social History  . Marital Status: Married    Spouse Name: N/A    Number of Children: N/A  . Years of Education: N/A   Occupational History  . Not on file.   Social History Main Topics  . Smoking status: Never Smoker   . Smokeless tobacco: Never Used  . Alcohol Use: 4.2 oz/week    7 Glasses of wine per week  . Drug Use: No  . Sexual Activity: Not on file   Other Topics Concern  . Not on file   Social History Narrative  . No narrative on file    Past Surgical History  Procedure Laterality Date  . Elbow surgery  01/2002    left  . Shoulder surgery  09/2007    injury at work, dislocated  . Incision and drainage perirectal abscess  1998  . Lumbar disc surgery  1992  . Hand surgery  02/2010    basal thumb joint removal and tendon transplant([facility])  . Colonoscopy w/ biopsies  06/2001, 08/2006, 07/2008    colon polyp, ulcerative colitis, lymphoid aggregates  . Upper  gastrointestinal endoscopy  06/2001    2cm hiatal hernia  . Carpal tunnel release  2012  . Anterior fusion cervical spine  2012  . Basal cell carcinoma excision Left     ear, head    Family History  Problem Relation Age of Onset  . Heart disease Mother     s/p CABG age 56  . Cancer Father     colon , brain  . Colon cancer Father   . COPD Sister   . Heart disease Brother     CAD  . Seizures Brother 68    Allergies  Allergen Reactions  . Aspirin     REACTION: unspecified  . Mercaptopurine     REACTION: myalgias  . Procaine Hcl     REACTION: MAKES HEART RACE    Current Outpatient Prescriptions on File Prior to Visit  Medication Sig Dispense Refill  . albuterol (PROAIR HFA) 108 (90 BASE) MCG/ACT inhaler Inhale 2 puffs into the lungs 4 (four) times daily.  2 Inhaler  6  . ALPRAZolam (XANAX) 0.5 MG tablet Take 0.5 tablets (0.25 mg total) by mouth daily.  30 tablet  2  . atorvastatin (LIPITOR) 40 MG tablet TAKE 1 TABLET (40 MG TOTAL) BY MOUTH DAILY.  30 tablet  2  . Budesonide (PULMICORT FLEXHALER) 90 MCG/ACT inhaler Inhale 2  puffs into the lungs 2 (two) times daily.  1 Inhaler  5  . buPROPion (WELLBUTRIN SR) 150 MG 12 hr tablet TAKE 1 TABLET BY MOUTH EVERY MORNING  30 tablet  2  . Calcium Carbonate-Vitamin D (CALTRATE 600+D) 600-400 MG-UNIT per tablet Take 1 tablet by mouth daily.      . ciprofloxacin (CIPRO) 500 MG tablet Take 1 tablet (500 mg total) by mouth 2 (two) times daily.  56 tablet  0  . Cyanocobalamin (VITAMIN B 12 PO) Take 5,000 mcg by mouth daily.      . fexofenadine (ALLEGRA) 180 MG tablet Take 180 mg by mouth daily as needed.      . GuaiFENesin (CHEST CONGESTION RELIEF PO) Take 2 tablets by mouth as needed.      . hyoscyamine (LEVBID) 0.375 MG 12 hr tablet TAKE 1 TABLET BY MOUTH EVERY 12 HOURS AS NEEDED FOR CRAMPING  60 tablet  2  . lisinopril (PRINIVIL,ZESTRIL) 10 MG tablet TAKE 1 TABLET (10 MG TOTAL) BY MOUTH DAILY.  30 tablet  5  . Multiple Vitamin  (MULTIVITAMIN) tablet Take 1 tablet by mouth daily.        Marland Kitchen omeprazole (PRILOSEC) 20 MG capsule TAKE 1 CAPSULE (20 MG TOTAL) BY MOUTH DAILY.  30 capsule  12  . predniSONE (DELTASONE) 1 MG tablet TAKE TWO TABLETS BY MOUTH DAILY  60 tablet  1  . zolpidem (AMBIEN) 10 MG tablet TAKE 1 TABLET(S) BY MOUTH EVERY NIGHT AT BEDTIME AS NEEDED FOR SLEEP  30 tablet  3   No current facility-administered medications on file prior to visit.    BP 150/80  Pulse 60  Temp(Src) 97.8 F (36.6 C) (Oral)  Resp 18  Ht 5' 5.5" (1.664 m)  Wt 141 lb (63.957 kg)  BMI 23.10 kg/m2     Review of Systems  Constitutional: Negative for fever, chills, appetite change and fatigue.  HENT: Negative for congestion, dental problem, ear pain, hearing loss, sore throat, tinnitus, trouble swallowing and voice change.   Eyes: Negative for pain, discharge and visual disturbance.  Respiratory: Negative for cough, chest tightness, wheezing and stridor.   Cardiovascular: Negative for chest pain, palpitations and leg swelling.  Gastrointestinal: Negative for nausea, vomiting, abdominal pain, diarrhea, constipation, blood in stool and abdominal distention.  Genitourinary: Positive for urgency and decreased urine volume. Negative for hematuria, flank pain, discharge, difficulty urinating and genital sores.  Musculoskeletal: Negative for arthralgias, back pain, gait problem, joint swelling, myalgias and neck stiffness.  Skin: Negative for rash.  Neurological: Negative for dizziness, syncope, speech difficulty, weakness, numbness and headaches.  Hematological: Negative for adenopathy. Does not bruise/bleed easily.  Psychiatric/Behavioral: Negative for behavioral problems and dysphoric mood. The patient is not nervous/anxious.        Objective:   Physical Exam  Constitutional: He appears well-developed and well-nourished. No distress.          Assessment & Plan:   Acute prostatitis improving.  Will complete 30 days of  therapy Hypertension stable  Return as scheduled for followup

## 2014-10-24 ENCOUNTER — Telehealth: Payer: Self-pay | Admitting: Internal Medicine

## 2014-10-24 MED ORDER — ZOLPIDEM TARTRATE 10 MG PO TABS: ORAL_TABLET | ORAL | Status: DC

## 2014-10-24 NOTE — Telephone Encounter (Signed)
Rx called in to pharmacy. 

## 2014-10-24 NOTE — Telephone Encounter (Signed)
[facility], [place] [address] is requesting re-fill on zolpidem (AMBIEN) 10 MG tablet

## 2014-10-27 ENCOUNTER — Other Ambulatory Visit: Payer: Self-pay | Admitting: Internal Medicine

## 2014-11-11 ENCOUNTER — Other Ambulatory Visit: Payer: Self-pay | Admitting: Internal Medicine

## 2014-11-15 ENCOUNTER — Telehealth: Payer: Self-pay | Admitting: Internal Medicine

## 2014-11-15 NOTE — Telephone Encounter (Signed)
Pt has been scheduled for 4:30 wed.

## 2014-11-15 NOTE — Telephone Encounter (Signed)
Please add pt to schedule tomorrow afternoon.

## 2014-11-15 NOTE — Telephone Encounter (Signed)
Noted  

## 2014-11-15 NOTE — Telephone Encounter (Signed)
Pt is still having prostate issues. Has finished his meds. Also, wife states the increased adderall rx is not good for the pt. Needs to bring back his scripts and go back to lower dose.  Is it ok to use a SD appt? Pt does not want to see anyone else. pls advise

## 2014-11-16 ENCOUNTER — Ambulatory Visit (INDEPENDENT_AMBULATORY_CARE_PROVIDER_SITE_OTHER): Payer: Managed Care, Other (non HMO) | Admitting: Internal Medicine

## 2014-11-16 ENCOUNTER — Encounter: Payer: Self-pay | Admitting: Internal Medicine

## 2014-11-16 VITALS — BP 130/80 | HR 55 | Temp 98.6°F | Resp 20 | Ht 65.5 in | Wt 137.0 lb

## 2014-11-16 DIAGNOSIS — F909 Attention-deficit hyperactivity disorder, unspecified type: Secondary | ICD-10-CM

## 2014-11-16 DIAGNOSIS — R634 Abnormal weight loss: Secondary | ICD-10-CM

## 2014-11-16 DIAGNOSIS — F988 Other specified behavioral and emotional disorders with onset usually occurring in childhood and adolescence: Secondary | ICD-10-CM

## 2014-11-16 DIAGNOSIS — M5136 Other intervertebral disc degeneration, lumbar region: Secondary | ICD-10-CM

## 2014-11-16 MED ORDER — AMPHETAMINE-DEXTROAMPHET ER 15 MG PO CP24: 20.0000 mg | ORAL_CAPSULE | Freq: Every day | ORAL | Status: DC

## 2014-11-16 NOTE — Progress Notes (Signed)
Subjective:    Patient ID: Joseph Mejia, male    DOB: 1946/08/18, 68 y.o.   MRN: [id number]  HPI  68 year old patientwho has a history of chronic low back pain secondary to lumbar disc disease and spinal stenosis.  He was seen several weeks ago and treated for suspected prostatitis and a complete 30 days of Cipro.  He continues to have low back pain.  It is aggravated by movement.  He was concerned about a possible persistent prostate infection  He has a history of ADHD and his Adderall dose was uptitrated from 10-20 mg.  He has not tolerated this dose well with increased irritability, anorexia and weight loss  Past Medical History  Diagnosis Date  . HYPERLIPIDEMIA 08/03/2007  . ANXIETY 08/03/2007  . DEPRESSION 08/03/2007  . ADD 02/23/2008  . HYPERTENSION 08/03/2007  . ASTHMA 08/03/2007  . GERD 08/03/2007  . UNIVERSAL ULCERATIVE COLITIS 01/26/2009  . Irritable bowel syndrome 11/02/2008  . RENAL DISEASE, CHRONIC, MILD 11/04/2008  . ERECTILE DYSFUNCTION, ORGANIC 02/23/2008  . OSTEOARTHRITIS 11/02/2008  . HIP PAIN, RIGHT 05/27/2008  . HAND PAIN, LEFT 06/20/2009  . OSTEOPENIA 07/26/2008  . CHEST PAIN 11/17/2008  . Closed fracture of four ribs 06/30/2008  . Failed moderate sedation during procedure 11/02/2008    History   Social History  . Marital Status: Married    Spouse Name: N/A    Number of Children: N/A  . Years of Education: N/A   Occupational History  . Not on file.   Social History Main Topics  . Smoking status: Never Smoker   . Smokeless tobacco: Never Used  . Alcohol Use: 4.2 oz/week    7 Glasses of wine per week  . Drug Use: No  . Sexual Activity: Not on file   Other Topics Concern  . Not on file   Social History Narrative    Past Surgical History  Procedure Laterality Date  . Elbow surgery  01/2002    left  . Shoulder surgery  09/2007    injury at work, dislocated  . Incision and drainage perirectal abscess  1998  . Lumbar disc surgery  1992  . Hand surgery   02/2010    basal thumb joint removal and tendon transplant([facility])  . Colonoscopy w/ biopsies  06/2001, 08/2006, 07/2008    colon polyp, ulcerative colitis, lymphoid aggregates  . Upper gastrointestinal endoscopy  06/2001    2cm hiatal hernia  . Carpal tunnel release  2012  . Anterior fusion cervical spine  2012  . Basal cell carcinoma excision Left     ear, head    Family History  Problem Relation Age of Onset  . Heart disease Mother     s/p CABG age 23  . Cancer Father     colon , brain  . Colon cancer Father   . COPD Sister   . Heart disease Brother     CAD  . Seizures Brother 32    Allergies  Allergen Reactions  . Aspirin     REACTION: unspecified  . Mercaptopurine     REACTION: myalgias  . Procaine Hcl     REACTION: MAKES HEART RACE    Current Outpatient Prescriptions on File Prior to Visit  Medication Sig Dispense Refill  . albuterol (PROAIR HFA) 108 (90 BASE) MCG/ACT inhaler Inhale 2 puffs into the lungs 4 (four) times daily. 2 Inhaler 6  . ALPRAZolam (XANAX) 0.5 MG tablet Take 0.5 tablets (0.25 mg total) by mouth daily. 30 tablet  2  . atorvastatin (LIPITOR) 40 MG tablet TAKE 1 TABLET (40 MG TOTAL) BY MOUTH DAILY. 30 tablet 5  . Budesonide (PULMICORT FLEXHALER) 90 MCG/ACT inhaler Inhale 2 puffs into the lungs 2 (two) times daily. 1 Inhaler 5  . buPROPion (WELLBUTRIN SR) 150 MG 12 hr tablet TAKE 1 TABLET BY MOUTH EVERY MORNING 30 tablet 2  . Calcium Carbonate-Vitamin D (CALTRATE 600+D) 600-400 MG-UNIT per tablet Take 1 tablet by mouth daily.    . Cyanocobalamin (VITAMIN B 12 PO) Take 5,000 mcg by mouth daily.    . fexofenadine (ALLEGRA) 180 MG tablet Take 180 mg by mouth daily as needed.    . GuaiFENesin (CHEST CONGESTION RELIEF PO) Take 2 tablets by mouth as needed.    . hyoscyamine (LEVBID) 0.375 MG 12 hr tablet TAKE 1 TABLET BY MOUTH EVERY 12 HOURS AS NEEDED FOR CRAMPING 60 tablet 1  . lisinopril (PRINIVIL,ZESTRIL) 10 MG tablet TAKE 1 TABLET (10 MG TOTAL) BY MOUTH  DAILY. 30 tablet 5  . Multiple Vitamin (MULTIVITAMIN) tablet Take 1 tablet by mouth daily.      Marland Kitchen omeprazole (PRILOSEC) 20 MG capsule TAKE 1 CAPSULE (20 MG TOTAL) BY MOUTH DAILY. 30 capsule 12  . predniSONE (DELTASONE) 1 MG tablet TAKE TWO TABLETS BY MOUTH DAILY 60 tablet 1  . zolpidem (AMBIEN) 10 MG tablet TAKE 1 TABLET(S) BY MOUTH EVERY NIGHT AT BEDTIME AS NEEDED FOR SLEEP 30 tablet 5   No current facility-administered medications on file prior to visit.    BP 130/80 mmHg  Pulse 55  Temp(Src) 98.6 F (37 C) (Oral)  Resp 20  Ht 5' 5.5" (1.664 m)  Wt 137 lb (62.143 kg)  BMI 22.44 kg/m2     Review of Systems  Constitutional: Positive for activity change, appetite change, fatigue and unexpected weight change. Negative for fever and chills.  HENT: Negative for congestion, dental problem, ear pain, hearing loss, sore throat, tinnitus, trouble swallowing and voice change.   Eyes: Negative for pain, discharge and visual disturbance.  Respiratory: Negative for cough, chest tightness, wheezing and stridor.   Cardiovascular: Negative for chest pain, palpitations and leg swelling.  Gastrointestinal: Negative for nausea, vomiting, abdominal pain, diarrhea, constipation, blood in stool and abdominal distention.  Genitourinary: Negative for urgency, hematuria, flank pain, discharge, difficulty urinating and genital sores.  Musculoskeletal: Positive for back pain. Negative for myalgias, joint swelling, arthralgias, gait problem and neck stiffness.  Skin: Negative for rash.  Neurological: Negative for dizziness, syncope, speech difficulty, weakness, numbness and headaches.  Hematological: Negative for adenopathy. Does not bruise/bleed easily.  Psychiatric/Behavioral: Negative for behavioral problems and dysphoric mood. The patient is not nervous/anxious.        Objective:   Physical Exam  Constitutional: He appears well-developed and well-nourished. No distress.  Musculoskeletal:  Right  lumbar musculature tight and tense Straight leg testing on the right did aggravate and reproduce his low back pain          Assessment & Plan:   Low back pain secondary to multilevel degenerative disc disease and spinal stenosis.  We'll treat symptomatically with Advil, rest and observe.  Will call if no improvement History of prostatitis ADHD.  Will decrease Adderall to 15 mg daily and observe on this dose

## 2014-11-16 NOTE — Patient Instructions (Signed)
You  may move around, but avoid painful motions and activities.  Apply heat  to the sore area for 15 to 20 minutes 3 or 4 times daily for the next two to 3 days.  Take 400-600 mg of ibuprofen ( Advil, Motrin) with food every  6 hours as needed for pain relief

## 2014-11-16 NOTE — Progress Notes (Signed)
Pre visit review using our clinic review tool, if applicable. No additional management support is needed unless otherwise documented below in the visit note. 

## 2014-11-17 ENCOUNTER — Other Ambulatory Visit: Payer: Self-pay | Admitting: Internal Medicine

## 2014-12-22 ENCOUNTER — Other Ambulatory Visit: Payer: Self-pay | Admitting: Internal Medicine

## 2015-02-03 ENCOUNTER — Other Ambulatory Visit: Payer: Self-pay | Admitting: Internal Medicine

## 2015-02-17 ENCOUNTER — Ambulatory Visit (INDEPENDENT_AMBULATORY_CARE_PROVIDER_SITE_OTHER): Payer: Managed Care, Other (non HMO) | Admitting: Internal Medicine

## 2015-02-17 ENCOUNTER — Encounter: Payer: Self-pay | Admitting: Internal Medicine

## 2015-02-17 VITALS — BP 128/68 | Temp 98.0°F | Wt 134.0 lb

## 2015-02-17 DIAGNOSIS — F909 Attention-deficit hyperactivity disorder, unspecified type: Secondary | ICD-10-CM

## 2015-02-17 DIAGNOSIS — M5136 Other intervertebral disc degeneration, lumbar region: Secondary | ICD-10-CM

## 2015-02-17 DIAGNOSIS — I10 Essential (primary) hypertension: Secondary | ICD-10-CM

## 2015-02-17 DIAGNOSIS — K5100 Ulcerative (chronic) pancolitis without complications: Secondary | ICD-10-CM

## 2015-02-17 DIAGNOSIS — K5180 Other ulcerative colitis without complications: Secondary | ICD-10-CM

## 2015-02-17 DIAGNOSIS — F988 Other specified behavioral and emotional disorders with onset usually occurring in childhood and adolescence: Secondary | ICD-10-CM

## 2015-02-17 MED ORDER — OMEPRAZOLE 20 MG PO CPDR: DELAYED_RELEASE_CAPSULE | ORAL | Status: DC

## 2015-02-17 MED ORDER — AMPHETAMINE-DEXTROAMPHET ER 10 MG PO CP24: 10.0000 mg | ORAL_CAPSULE | Freq: Every day | ORAL | Status: DC

## 2015-02-17 MED ORDER — ATORVASTATIN CALCIUM 40 MG PO TABS: ORAL_TABLET | ORAL | Status: DC

## 2015-02-17 MED ORDER — LISINOPRIL 10 MG PO TABS: ORAL_TABLET | ORAL | Status: DC

## 2015-02-17 NOTE — Progress Notes (Signed)
Pre visit review using our clinic review tool, if applicable. No additional management support is needed unless otherwise documented below in the visit note. 

## 2015-02-17 NOTE — Patient Instructions (Signed)
Limit your sodium (Salt) intake    It is important that you exercise regularly, at least 20 minutes 3 to 4 times per week.  If you develop chest pain or shortness of breath seek  medical attention.  Please check your blood pressure on a regular basis.  If it is consistently greater than 150/90, please make an office appointment.  Return in 6 months for follow-up

## 2015-02-17 NOTE — Progress Notes (Signed)
Subjective:    Patient ID: Joseph Mejia, male    DOB: 09-17-1946, 69 y.o.   MRN: [id number]  HPI  Wt Readings from Last 3 Encounters:  02/17/15 134 lb (60.782 kg)  11/16/14 137 lb (62.143 kg)  10/03/14 141 lb (63.86 kg)   69 year old patient who is seen today in follow-up.  He has a history of universal UC and remains on low-dose prednisone.  He is now on 2 mg daily.  No recent bone density study. He has treated hypertension which has been stable. He has a history of ADD and continues to lose weight.  He describes poor appetite and stress. He has a history of dyslipidemia and asthma.  There has been some increase albuterol use during the winter.  His hypertension has been well controlled.  He has a history of osteoarthritis which has been stable.  He has chronic kidney disease  Past Medical History  Diagnosis Date  . HYPERLIPIDEMIA 08/03/2007  . ANXIETY 08/03/2007  . DEPRESSION 08/03/2007  . ADD 02/23/2008  . HYPERTENSION 08/03/2007  . ASTHMA 08/03/2007  . GERD 08/03/2007  . UNIVERSAL ULCERATIVE COLITIS 01/26/2009  . Irritable bowel syndrome 11/02/2008  . RENAL DISEASE, CHRONIC, MILD 11/04/2008  . ERECTILE DYSFUNCTION, ORGANIC 02/23/2008  . OSTEOARTHRITIS 11/02/2008  . HIP PAIN, RIGHT 05/27/2008  . HAND PAIN, LEFT 06/20/2009  . OSTEOPENIA 07/26/2008  . CHEST PAIN 11/17/2008  . Closed fracture of four ribs 06/30/2008  . Failed moderate sedation during procedure 11/02/2008    History   Social History  . Marital Status: Married    Spouse Name: N/A  . Number of Children: N/A  . Years of Education: N/A   Occupational History  . Not on file.   Social History Main Topics  . Smoking status: Never Smoker   . Smokeless tobacco: Never Used  . Alcohol Use: 4.2 oz/week    7 Glasses of wine per week  . Drug Use: No  . Sexual Activity: Not on file   Other Topics Concern  . Not on file   Social History Narrative    Past Surgical History  Procedure Laterality Date  . Elbow surgery   01/2002    left  . Shoulder surgery  09/2007    injury at work, dislocated  . Incision and drainage perirectal abscess  1998  . Lumbar disc surgery  1992  . Hand surgery  02/2010    basal thumb joint removal and tendon transplant([facility])  . Colonoscopy w/ biopsies  06/2001, 08/2006, 07/2008    colon polyp, ulcerative colitis, lymphoid aggregates  . Upper gastrointestinal endoscopy  06/2001    2cm hiatal hernia  . Carpal tunnel release  2012  . Anterior fusion cervical spine  2012  . Basal cell carcinoma excision Left     ear, head    Family History  Problem Relation Age of Onset  . Heart disease Mother     s/p CABG age 53  . Cancer Father     colon , brain  . Colon cancer Father   . COPD Sister   . Heart disease Brother     CAD  . Seizures Brother 57    Allergies  Allergen Reactions  . Aspirin     REACTION: unspecified  . Mercaptopurine     REACTION: myalgias  . Procaine Hcl     REACTION: MAKES HEART RACE    Current Outpatient Prescriptions on File Prior to Visit  Medication Sig Dispense Refill  . albuterol (PROAIR  HFA) 108 (90 BASE) MCG/ACT inhaler Inhale 2 puffs into the lungs 4 (four) times daily. 2 Inhaler 6  . ALPRAZolam (XANAX) 0.5 MG tablet Take 0.5 tablets (0.25 mg total) by mouth daily. 30 tablet 2  . amphetamine-dextroamphetamine (ADDERALL XR) 15 MG 24 hr capsule Take 1 capsule by mouth daily. 30 capsule 0  . Budesonide (PULMICORT FLEXHALER) 90 MCG/ACT inhaler Inhale 2 puffs into the lungs 2 (two) times daily. 1 Inhaler 5  . buPROPion (WELLBUTRIN SR) 150 MG 12 hr tablet TAKE 1 TABLET BY MOUTH EVERY MORNING 30 tablet 1  . Calcium Carbonate-Vitamin D (CALTRATE 600+D) 600-400 MG-UNIT per tablet Take 1 tablet by mouth daily.    . Cyanocobalamin (VITAMIN B 12 PO) Take 5,000 mcg by mouth daily.    . fexofenadine (ALLEGRA) 180 MG tablet Take 180 mg by mouth daily as needed.    . GuaiFENesin (CHEST CONGESTION RELIEF PO) Take 2 tablets by mouth as needed.    .  hyoscyamine (LEVBID) 0.375 MG 12 hr tablet TAKE 1 TABLET BY MOUTH EVERY 12 HOURS AS NEEDED FOR CRAMPING 60 tablet 1  . Multiple Vitamin (MULTIVITAMIN) tablet Take 1 tablet by mouth daily.      . predniSONE (DELTASONE) 1 MG tablet TAKE TWO TABLETS BY MOUTH DAILY 60 tablet 2  . predniSONE (DELTASONE) 1 MG tablet TAKE TWO TABLETS BY MOUTH DAILY 60 tablet 0  . zolpidem (AMBIEN) 10 MG tablet TAKE 1 TABLET(S) BY MOUTH EVERY NIGHT AT BEDTIME AS NEEDED FOR SLEEP 30 tablet 5   No current facility-administered medications on file prior to visit.    BP 128/68 mmHg  Temp(Src) 98 F (36.7 C) (Oral)  Wt 134 lb (60.782 kg)    Review of Systems  Constitutional: Positive for appetite change and unexpected weight change. Negative for fever, chills and fatigue.  HENT: Negative for congestion, dental problem, ear pain, hearing loss, sore throat, tinnitus, trouble swallowing and voice change.   Eyes: Negative for pain, discharge and visual disturbance.  Respiratory: Negative for cough, chest tightness, wheezing and stridor.   Cardiovascular: Negative for chest pain, palpitations and leg swelling.  Gastrointestinal: Negative for nausea, vomiting, abdominal pain, diarrhea, constipation, blood in stool and abdominal distention.  Genitourinary: Negative for urgency, hematuria, flank pain, discharge, difficulty urinating and genital sores.  Musculoskeletal: Negative for myalgias, back pain, joint swelling, arthralgias, gait problem and neck stiffness.  Skin: Negative for rash.  Neurological: Negative for dizziness, syncope, speech difficulty, weakness, numbness and headaches.  Hematological: Negative for adenopathy. Does not bruise/bleed easily.  Psychiatric/Behavioral: Negative for behavioral problems and dysphoric mood. The patient is nervous/anxious.        Objective:   Physical Exam  Constitutional: He is oriented to person, place, and time. He appears well-developed and well-nourished. No distress.    HENT:  Head: Normocephalic.  Right Ear: External ear normal.  Left Ear: External ear normal.  Eyes: Conjunctivae and EOM are normal.  Neck: Normal range of motion.  Cardiovascular: Normal rate and normal heart sounds.   Grade 3/6 systolic murmur Frequent ectopics  Pulmonary/Chest: Breath sounds normal.  Abdominal: Bowel sounds are normal.  Musculoskeletal: Normal range of motion. He exhibits no edema or tenderness.  Neurological: He is alert and oriented to person, place, and time.  Psychiatric: He has a normal mood and affect. His behavior is normal.          Assessment & Plan:   Hypertension, well-controlled ADD.  Will decrease Adderall to 10 mg daily Weight loss. Ulcerative  colitis.  Follow-up GI.  Joseph Mejia needs follow-up colonoscopy this year Dyslipidemia.  Continue statin therapy  CPX 6 months

## 2015-02-24 ENCOUNTER — Other Ambulatory Visit: Payer: Self-pay | Admitting: Internal Medicine

## 2015-02-24 MED ORDER — ALPRAZOLAM 0.5 MG PO TABS: 0.2500 mg | ORAL_TABLET | Freq: Every day | ORAL | Status: DC

## 2015-02-24 NOTE — Telephone Encounter (Signed)
Rx request for alprazolam 0.5 mg tablet-Take 1/2 tablet by mouth daily   Pharm:  [facility]   Pls advise.

## 2015-03-04 ENCOUNTER — Other Ambulatory Visit: Payer: Self-pay | Admitting: Internal Medicine

## 2015-04-04 ENCOUNTER — Telehealth: Payer: Self-pay | Admitting: *Deleted

## 2015-04-04 NOTE — Telephone Encounter (Signed)
Refill zolpidem 10 mg qhs [facility]

## 2015-04-05 MED ORDER — ZOLPIDEM TARTRATE 10 MG PO TABS: ORAL_TABLET | ORAL | Status: DC

## 2015-04-05 NOTE — Telephone Encounter (Signed)
Rx called in to pharmacy. 

## 2015-04-17 ENCOUNTER — Ambulatory Visit (INDEPENDENT_AMBULATORY_CARE_PROVIDER_SITE_OTHER): Payer: Managed Care, Other (non HMO) | Admitting: Adult Health

## 2015-04-17 ENCOUNTER — Encounter: Payer: Self-pay | Admitting: Adult Health

## 2015-04-17 ENCOUNTER — Ambulatory Visit (INDEPENDENT_AMBULATORY_CARE_PROVIDER_SITE_OTHER)
Admission: RE | Admit: 2015-04-17 | Discharge: 2015-04-17 | Disposition: A | Discharge status: Home / Self Care | Payer: Managed Care, Other (non HMO) | Source: Ambulatory Visit | Attending: Adult Health | Admitting: Adult Health

## 2015-04-17 VITALS — BP 92/60 | Temp 98.2°F | Wt 133.0 lb

## 2015-04-17 DIAGNOSIS — Z800 Family history of malignant neoplasm of digestive organs: Secondary | ICD-10-CM

## 2015-04-17 DIAGNOSIS — Z7952 Long term (current) use of systemic steroids: Secondary | ICD-10-CM

## 2015-04-17 DIAGNOSIS — R634 Abnormal weight loss: Secondary | ICD-10-CM

## 2015-04-17 LAB — RENAL FUNCTION PANEL
Albumin: 4 g/dL (ref 3.5–5.2)
BUN: 32 mg/dL — AB (ref 6–23)
CO2: 26 mEq/L (ref 19–32)
Calcium: 9.7 mg/dL (ref 8.4–10.5)
Chloride: 107 mEq/L (ref 96–112)
Creatinine, Ser: 1.58 mg/dL — ABNORMAL HIGH (ref 0.40–1.50)
GFR: 46.51 mL/min — ABNORMAL LOW (ref >60.00)
GLUCOSE: 92 mg/dL (ref 70–99)
PHOSPHORUS: 2.9 mg/dL (ref 2.3–4.6)
POTASSIUM: 4.5 meq/L (ref 3.5–5.1)
Sodium: 142 mEq/L (ref 135–145)

## 2015-04-17 LAB — URINALYSIS, ROUTINE W REFLEX MICROSCOPIC
Bilirubin Urine: NEGATIVE
Hgb urine dipstick: NEGATIVE
Ketones, ur: NEGATIVE
LEUKOCYTES UA: NEGATIVE
NITRITE: NEGATIVE
PH: 6 (ref 5.0–8.0)
RBC / HPF: NONE SEEN (ref 0–?)
Specific Gravity, Urine: 1.01 (ref 1.000–1.030)
Total Protein, Urine: NEGATIVE
URINE GLUCOSE: NEGATIVE
UROBILINOGEN UA: 0.2 (ref 0.0–1.0)

## 2015-04-17 LAB — CBC
HCT: 40.6 % (ref 39.0–52.0)
Hemoglobin: 13.6 g/dL (ref 13.0–17.0)
MCHC: 33.5 g/dL (ref 30.0–36.0)
MCV: 92 fl (ref 78.0–100.0)
Platelets: 208 10*3/uL (ref 150.0–400.0)
RBC: 4.41 Mil/uL (ref 4.22–5.81)
RDW: 13.6 % (ref 11.5–15.5)
WBC: 5.4 10*3/uL (ref 4.0–10.5)

## 2015-04-17 LAB — HEPATIC FUNCTION PANEL
ALBUMIN: 4 g/dL (ref 3.5–5.2)
ALK PHOS: 65 U/L (ref 39–117)
ALT: 29 U/L (ref 0–53)
AST: 28 U/L (ref 0–37)
BILIRUBIN DIRECT: 0.1 mg/dL (ref 0.0–0.3)
Total Bilirubin: 0.4 mg/dL (ref 0.2–1.2)
Total Protein: 7 g/dL (ref 6.0–8.3)

## 2015-04-17 LAB — BASIC METABOLIC PANEL
BUN: 32 mg/dL — ABNORMAL HIGH (ref 6–23)
CALCIUM: 9.7 mg/dL (ref 8.4–10.5)
CO2: 26 mEq/L (ref 19–32)
Chloride: 107 mEq/L (ref 96–112)
Creatinine, Ser: 1.58 mg/dL — ABNORMAL HIGH (ref 0.40–1.50)
GFR: 46.51 mL/min — AB (ref >60.00)
Glucose, Bld: 92 mg/dL (ref 70–99)
POTASSIUM: 4.5 meq/L (ref 3.5–5.1)
SODIUM: 142 meq/L (ref 135–145)

## 2015-04-17 LAB — HEMOGLOBIN A1C: Hgb A1c MFr Bld: 5.5 % (ref 4.6–6.5)

## 2015-04-17 LAB — SEDIMENTATION RATE: Sed Rate: 21 mm/hr (ref 0–22)

## 2015-04-17 LAB — TSH: TSH: 1.41 u[IU]/mL (ref 0.35–4.50)

## 2015-04-17 LAB — C-REACTIVE PROTEIN: CRP: 0.2 mg/dL — AB (ref 0.5–20.0)

## 2015-04-17 MED ORDER — AMPHETAMINE-DEXTROAMPHETAMINE 7.5 MG PO TABS: 7.5000 mg | ORAL_TABLET | Freq: Every day | ORAL | Status: DC

## 2015-04-17 NOTE — Progress Notes (Addendum)
Subjective:    Patient ID: Joseph Mejia, male    DOB: 05/04/1946, 69 y.o.   MRN: [id number]  HPI  Patient presents to the office today for continued weight loss. He endorses stress from work and having to drive a lot.  Does not endorse stress at home or with family. He does endorse some depression, does not get out of the house like he used to. PHQ 9 is 13 but it is convoluted due to his ADD. He does not feel as though he is depressed.   He states " that his appetite is not there." If he has to cook for himself than he may not eat or just have a boost. If he goes out with his wife for dinner he will eat a little. Is drinking boost.  Past Medical History  Diagnosis Date  . HYPERLIPIDEMIA 08/03/2007  . ANXIETY 08/03/2007  . DEPRESSION 08/03/2007  . ADD 02/23/2008  . HYPERTENSION 08/03/2007  . ASTHMA 08/03/2007  . GERD 08/03/2007  . UNIVERSAL ULCERATIVE COLITIS 01/26/2009  . Irritable bowel syndrome 11/02/2008  . RENAL DISEASE, CHRONIC, MILD 11/04/2008  . ERECTILE DYSFUNCTION, ORGANIC 02/23/2008  . OSTEOARTHRITIS 11/02/2008  . HIP PAIN, RIGHT 05/27/2008  . HAND PAIN, LEFT 06/20/2009  . OSTEOPENIA 07/26/2008  . CHEST PAIN 11/17/2008  . Closed fracture of four ribs 06/30/2008  . Failed moderate sedation during procedure 11/02/2008    History   Social History  . Marital Status: Married    Spouse Name: N/A  . Number of Children: N/A  . Years of Education: N/A   Occupational History  . Not on file.   Social History Main Topics  . Smoking status: Never Smoker   . Smokeless tobacco: Never Used  . Alcohol Use: 4.2 oz/week    7 Glasses of wine per week  . Drug Use: No  . Sexual Activity: Not on file   Other Topics Concern  . Not on file   Social History Narrative    Past Surgical History  Procedure Laterality Date  . Elbow surgery  01/2002    left  . Shoulder surgery  09/2007    injury at work, dislocated  . Incision and drainage perirectal abscess  1998  . Lumbar disc surgery   1992  . Hand surgery  02/2010    basal thumb joint removal and tendon transplant([facility])  . Colonoscopy w/ biopsies  06/2001, 08/2006, 07/2008    colon polyp, ulcerative colitis, lymphoid aggregates  . Upper gastrointestinal endoscopy  06/2001    2cm hiatal hernia  . Carpal tunnel release  2012  . Anterior fusion cervical spine  2012  . Basal cell carcinoma excision Left     ear, head    Family History  Problem Relation Age of Onset  . Heart disease Mother     s/p CABG age 43  . Cancer Father     colon , brain  . Colon cancer Father   . COPD Sister   . Heart disease Brother     CAD  . Seizures Brother 51    Allergies  Allergen Reactions  . Aspirin     REACTION: unspecified  . Mercaptopurine     REACTION: myalgias  . Procaine Hcl     REACTION: MAKES HEART RACE    Current Outpatient Prescriptions on File Prior to Visit  Medication Sig Dispense Refill  . albuterol (PROAIR HFA) 108 (90 BASE) MCG/ACT inhaler Inhale 2 puffs into the lungs 4 (four) times daily. 2  Inhaler 6  . ALPRAZolam (XANAX) 0.5 MG tablet Take 0.5 tablets (0.25 mg total) by mouth daily. 30 tablet 2  . amphetamine-dextroamphetamine (ADDERALL XR) 10 MG 24 hr capsule Take 1 capsule (10 mg total) by mouth daily. 30 capsule 0  . amphetamine-dextroamphetamine (ADDERALL XR) 10 MG 24 hr capsule Take 1 capsule (10 mg total) by mouth daily. 30 capsule 0  . amphetamine-dextroamphetamine (ADDERALL XR) 10 MG 24 hr capsule Take 1 capsule (10 mg total) by mouth daily. 30 capsule 0  . amphetamine-dextroamphetamine (ADDERALL XR) 15 MG 24 hr capsule Take 1 capsule by mouth daily. 30 capsule 0  . atorvastatin (LIPITOR) 40 MG tablet TAKE 1 TABLET (40 MG TOTAL) BY MOUTH DAILY. 30 tablet 5  . Budesonide (PULMICORT FLEXHALER) 90 MCG/ACT inhaler Inhale 2 puffs into the lungs 2 (two) times daily. 1 Inhaler 5  . buPROPion (WELLBUTRIN SR) 150 MG 12 hr tablet TAKE 1 TABLET BY MOUTH EVERY MORNING 30 tablet 5  . Calcium Carbonate-Vitamin D  (CALTRATE 600+D) 600-400 MG-UNIT per tablet Take 1 tablet by mouth daily.    . Cyanocobalamin (VITAMIN B 12 PO) Take 5,000 mcg by mouth daily.    . fexofenadine (ALLEGRA) 180 MG tablet Take 180 mg by mouth daily as needed.    . hyoscyamine (LEVBID) 0.375 MG 12 hr tablet TAKE 1 TABLET BY MOUTH EVERY 12 HOURS AS NEEDED FOR CRAMPING 60 tablet 5  . lisinopril (PRINIVIL,ZESTRIL) 10 MG tablet TAKE 1 TABLET (10 MG TOTAL) BY MOUTH DAILY. 30 tablet 5  . Multiple Vitamin (MULTIVITAMIN) tablet Take 1 tablet by mouth daily.      Marland Kitchen omeprazole (PRILOSEC) 20 MG capsule TAKE 1 CAPSULE (20 MG TOTAL) BY MOUTH DAILY. 30 capsule 12  . predniSONE (DELTASONE) 1 MG tablet TAKE TWO TABLETS BY MOUTH DAILY 60 tablet 0  . zolpidem (AMBIEN) 10 MG tablet TAKE 1 TABLET(S) BY MOUTH EVERY NIGHT AT BEDTIME AS NEEDED FOR SLEEP 30 tablet 5  . GuaiFENesin (CHEST CONGESTION RELIEF PO) Take 2 tablets by mouth as needed.     No current facility-administered medications on file prior to visit.    BP 92/60 mmHg  Temp(Src) 98.2 F (36.8 C) (Oral)  Wt 133 lb (60.328 kg)    Review of Systems  Constitutional: Positive for activity change, appetite change and unexpected weight change. Negative for fever.  Respiratory: Positive for shortness of breath. Negative for cough, choking and chest tightness.   Gastrointestinal: Negative.   Endocrine: Negative for cold intolerance and heat intolerance.  Genitourinary: Negative.   Musculoskeletal: Positive for back pain and neck pain. Negative for joint swelling and gait problem.  Skin: Positive for color change.  Neurological: Negative.   Hematological: Bruises/bleeds easily.       Wt Readings from Last 3 Encounters:  04/17/15 133 lb (60.328 kg)  02/17/15 134 lb (60.782 kg)  11/16/14 137 lb (62.143 kg)    Objective:   Physical Exam  Constitutional: He is oriented to person, place, and time. He appears well-developed. No distress.  thin  HENT:  Head: Normocephalic and  atraumatic.  Right Ear: External ear normal.  Left Ear: External ear normal.  Mouth/Throat: Oropharynx is clear and moist. No oropharyngeal exudate.  Eyes: Right eye exhibits no discharge. Left eye exhibits no discharge.  Neck: Normal range of motion. Neck supple. No thyromegaly present.  Cardiovascular: Normal rate and regular rhythm.  Exam reveals no gallop and no friction rub.   Murmur heard. 3/6 systolic murmur   Pulmonary/Chest: Effort normal  and breath sounds normal. No respiratory distress. He has no wheezes. He has no rales. He exhibits no tenderness.  Abdominal: Soft. Bowel sounds are normal. He exhibits no distension and no mass. There is no tenderness. There is no rebound and no guarding.  Musculoskeletal: Normal range of motion. He exhibits no edema or tenderness.  Lymphadenopathy:    He has no cervical adenopathy.  Neurological: He is alert and oriented to person, place, and time.  Skin: Skin is warm and dry. He is not diaphoretic.  Thin bruising skin from chronic Prednisone use.   Psychiatric: He has a normal mood and affect. His behavior is normal. Judgment and thought content normal.  Nursing note and vitals reviewed.       Assessment & Plan:  1. Loss of weight He continues to lose weight. Will get the following labs and update patient once results are back.  - CBC - Basic metabolic panel - Hemoglobin A1c - Urinalysis, Routine w reflex microscopic - Renal Function Panel - Hepatic function panel - HIV antibody - Hep C Antibody - TSH - Sedimentation Rate - C-reactive Protein - DG Chest 2 View; Future counseled on the importance of diet and exercise. He endorses wanting to get back into exercising. - Prescribed 7.5 mg Adderall for him to try. I believe that his adderall is a major culprit in him not having an appetite.  - Follow up as needed.   2. Family history of colon cancer  - Ambulatory referral to Gastroenterology - he was never called about the last  referral  - Follow up as needed 3. Long term current use of systemic steroids - Follow up as needed - DG Bone Density; Future - he was never called about this referral.

## 2015-04-17 NOTE — Progress Notes (Signed)
Pre visit review using our clinic review tool, if applicable. No additional management support is needed unless otherwise documented below in the visit note. 

## 2015-04-17 NOTE — Patient Instructions (Signed)
I will let you know what your blood work shows. Try going down on your adderall, if that does not work please let me know. I have also ordered a chest x ray for you. Please try and get outside more and walk as well as start a exercise program. Being outside can do wonders for your mood and health. Also you can try a high calorie protein drink like Get Mass.. Start off with a smaller dose than recommended due to your UC and see how your body accomodates this. If you need anything please let me know. It was a pleasure meeting you today.

## 2015-04-17 NOTE — Addendum Note (Signed)
Addended by: Apolinar Junes on: 04/17/2015 04:39 PM   Modules accepted: Orders, Medications

## 2015-04-18 LAB — HIV ANTIBODY (ROUTINE TESTING W REFLEX): HIV: NONREACTIVE

## 2015-04-18 LAB — HEPATITIS C ANTIBODY: HCV Ab: NEGATIVE

## 2015-04-30 HISTORY — PX: CERVICAL FUSION: SHX112

## 2015-05-12 ENCOUNTER — Telehealth: Payer: Self-pay | Admitting: Internal Medicine

## 2015-05-12 NOTE — Telephone Encounter (Signed)
Pt needs new rxs for next 3 month generic adderall 7.5 mg. Pt will pick up wednesday

## 2015-05-17 MED ORDER — AMPHETAMINE-DEXTROAMPHETAMINE 7.5 MG PO TABS: 7.5000 mg | ORAL_TABLET | Freq: Every day | ORAL | Status: DC

## 2015-05-17 MED ORDER — AMPHETAMINE-DEXTROAMPHETAMINE 7.5 MG PO TABS
7.5000 mg | ORAL_TABLET | Freq: Every day | ORAL | Status: DC
Start: 2015-05-17 — End: 2015-08-22

## 2015-05-17 NOTE — Telephone Encounter (Signed)
Left message Rx's ready for pickup, Rx's printed and signed.

## 2015-06-05 ENCOUNTER — Other Ambulatory Visit: Payer: Self-pay | Admitting: Internal Medicine

## 2015-06-13 ENCOUNTER — Encounter: Payer: Self-pay | Admitting: Adult Health

## 2015-07-04 ENCOUNTER — Encounter: Payer: Self-pay | Admitting: Internal Medicine

## 2015-08-01 ENCOUNTER — Other Ambulatory Visit: Payer: Self-pay | Admitting: Internal Medicine

## 2015-08-15 ENCOUNTER — Other Ambulatory Visit (INDEPENDENT_AMBULATORY_CARE_PROVIDER_SITE_OTHER): Payer: Managed Care, Other (non HMO)

## 2015-08-15 DIAGNOSIS — Z0000 Encounter for general adult medical examination without abnormal findings: Secondary | ICD-10-CM

## 2015-08-15 LAB — POCT URINALYSIS DIPSTICK
BILIRUBIN UA: NEGATIVE
GLUCOSE UA: NEGATIVE
KETONES UA: NEGATIVE
Leukocytes, UA: NEGATIVE
Nitrite, UA: NEGATIVE
PH UA: 6.5
Protein, UA: NEGATIVE
RBC UA: NEGATIVE
SPEC GRAV UA: 1.01
Urobilinogen, UA: 0.2

## 2015-08-15 LAB — HEPATIC FUNCTION PANEL
ALBUMIN: 3.9 g/dL (ref 3.5–5.2)
ALT: 23 U/L (ref 0–53)
AST: 25 U/L (ref 0–37)
Alkaline Phosphatase: 63 U/L (ref 39–117)
Bilirubin, Direct: 0.1 mg/dL (ref 0.0–0.3)
TOTAL PROTEIN: 6.5 g/dL (ref 6.0–8.3)
Total Bilirubin: 0.5 mg/dL (ref 0.2–1.2)

## 2015-08-15 LAB — CBC WITH DIFFERENTIAL/PLATELET
BASOS PCT: 0.8 % (ref 0.0–3.0)
Basophils Absolute: 0 10*3/uL (ref 0.0–0.1)
EOS PCT: 5.8 % — AB (ref 0.0–5.0)
Eosinophils Absolute: 0.3 10*3/uL (ref 0.0–0.7)
HCT: 41.3 % (ref 39.0–52.0)
HEMOGLOBIN: 13.9 g/dL (ref 13.0–17.0)
Lymphocytes Relative: 36.9 % (ref 12.0–46.0)
Lymphs Abs: 2 10*3/uL (ref 0.7–4.0)
MCHC: 33.6 g/dL (ref 30.0–36.0)
MCV: 93.5 fl (ref 78.0–100.0)
MONOS PCT: 12.2 % — AB (ref 3.0–12.0)
Monocytes Absolute: 0.7 10*3/uL (ref 0.1–1.0)
Neutro Abs: 2.5 10*3/uL (ref 1.4–7.7)
Neutrophils Relative %: 44.3 % (ref 43.0–77.0)
Platelets: 200 10*3/uL (ref 150.0–400.0)
RBC: 4.42 Mil/uL (ref 4.22–5.81)
RDW: 13.3 % (ref 11.5–15.5)
WBC: 5.6 10*3/uL (ref 4.0–10.5)

## 2015-08-15 LAB — TSH: TSH: 2.29 u[IU]/mL (ref 0.35–4.50)

## 2015-08-15 LAB — BASIC METABOLIC PANEL
BUN: 22 mg/dL (ref 6–23)
CHLORIDE: 107 meq/L (ref 96–112)
CO2: 31 mEq/L (ref 19–32)
Calcium: 9.4 mg/dL (ref 8.4–10.5)
Creatinine, Ser: 1.55 mg/dL — ABNORMAL HIGH (ref 0.40–1.50)
GFR: 47.5 mL/min — AB (ref >60.00)
GLUCOSE: 89 mg/dL (ref 70–99)
POTASSIUM: 5.1 meq/L (ref 3.5–5.1)
SODIUM: 144 meq/L (ref 135–145)

## 2015-08-15 LAB — LIPID PANEL
CHOL/HDL RATIO: 4
Cholesterol: 185 mg/dL (ref 0–200)
HDL: 51 mg/dL (ref >39.00)
LDL CALC: 114 mg/dL — AB (ref 0–99)
NONHDL: 134.36
TRIGLYCERIDES: 104 mg/dL (ref 0.0–149.0)
VLDL: 20.8 mg/dL (ref 0.0–40.0)

## 2015-08-15 LAB — PSA: PSA: 0.46 ng/mL (ref 0.10–4.00)

## 2015-08-22 ENCOUNTER — Other Ambulatory Visit: Payer: Self-pay | Admitting: *Deleted

## 2015-08-22 ENCOUNTER — Encounter: Payer: Self-pay | Admitting: Internal Medicine

## 2015-08-22 ENCOUNTER — Ambulatory Visit (INDEPENDENT_AMBULATORY_CARE_PROVIDER_SITE_OTHER): Payer: Managed Care, Other (non HMO) | Admitting: Internal Medicine

## 2015-08-22 VITALS — BP 128/70 | HR 65 | Temp 98.5°F | Resp 20 | Ht 65.0 in | Wt 137.0 lb

## 2015-08-22 DIAGNOSIS — K219 Gastro-esophageal reflux disease without esophagitis: Secondary | ICD-10-CM | POA: Diagnosis not present

## 2015-08-22 DIAGNOSIS — Z0000 Encounter for general adult medical examination without abnormal findings: Secondary | ICD-10-CM

## 2015-08-22 DIAGNOSIS — I10 Essential (primary) hypertension: Secondary | ICD-10-CM

## 2015-08-22 DIAGNOSIS — N182 Chronic kidney disease, stage 2 (mild): Secondary | ICD-10-CM

## 2015-08-22 DIAGNOSIS — E785 Hyperlipidemia, unspecified: Secondary | ICD-10-CM

## 2015-08-22 DIAGNOSIS — F909 Attention-deficit hyperactivity disorder, unspecified type: Secondary | ICD-10-CM

## 2015-08-22 DIAGNOSIS — F988 Other specified behavioral and emotional disorders with onset usually occurring in childhood and adolescence: Secondary | ICD-10-CM

## 2015-08-22 MED ORDER — AMPHETAMINE-DEXTROAMPHETAMINE 7.5 MG PO TABS: 7.5000 mg | ORAL_TABLET | Freq: Every day | ORAL | Status: DC

## 2015-08-22 NOTE — Progress Notes (Signed)
Subjective:    Patient ID: Joseph Mejia, male    DOB: 04/23/1946, 69 y.o.   MRN: [id number]  HPI     Patient ID: Joseph Mejia, male   DOB: 20-Mar-1946, 69 y.o.   MRN: [id number]  Subjective:    Patient ID: Joseph Mejia, male    DOB: 05-04-1946, 69 y.o.   MRN: [id number]  HPI  69 year-old patient who is in today for a wellness exam.  He is followed by GI due to universal colitis.  He has had C-spine surgery in November of 2014 and as well as a right-sided carpal toe release in December of 2014. He has a history of anxiety and ADHD and is now followed by Dr. Cheryln Manly.  He has a history of asthma, which has been stable.  He uses rescue medication rarely His prednisone dose is down to 1 mg twice a day.  His last colonoscopy was July 2013.  His colitis was in remission at that time He did have a follow-up bone density in the spring of this year.  He has osteopenia but showed a 5% improvement in the femoral neck T score.     Past Medical History  Diagnosis Date  . HYPERLIPIDEMIA 08/03/2007  . ANXIETY 08/03/2007  . DEPRESSION 08/03/2007  . ADD 02/23/2008  . HYPERTENSION 08/03/2007  . ASTHMA 08/03/2007  . GERD 08/03/2007  . UNIVERSAL ULCERATIVE COLITIS 01/26/2009  . Irritable bowel syndrome 11/02/2008  . RENAL DISEASE, CHRONIC, MILD 11/04/2008  . ERECTILE DYSFUNCTION, ORGANIC 02/23/2008  . OSTEOARTHRITIS 11/02/2008  . HIP PAIN, RIGHT 05/27/2008  . HAND PAIN, LEFT 06/20/2009  . OSTEOPENIA 07/26/2008  . CHEST PAIN 11/17/2008  . Closed fracture of four ribs 06/30/2008  . Failed moderate sedation during procedure 11/02/2008    Social History   Social History  . Marital Status: Married    Spouse Name: N/A  . Number of Children: N/A  . Years of Education: N/A   Occupational History  . Not on file.   Social History Main Topics  . Smoking status: Never Smoker   . Smokeless tobacco: Never Used  . Alcohol Use: 4.2 oz/week    7 Glasses of wine per week  . Drug Use: No  . Sexual  Activity: Not on file   Other Topics Concern  . Not on file   Social History Narrative    Past Surgical History  Procedure Laterality Date  . Elbow surgery  01/2002    left  . Shoulder surgery  09/2007    injury at work, dislocated  . Incision and drainage perirectal abscess  1998  . Lumbar disc surgery  1992  . Hand surgery  02/2010    basal thumb joint removal and tendon transplant([facility])  . Colonoscopy w/ biopsies  06/2001, 08/2006, 07/2008    colon polyp, ulcerative colitis, lymphoid aggregates  . Upper gastrointestinal endoscopy  06/2001    2cm hiatal hernia  . Carpal tunnel release  2012  . Anterior fusion cervical spine  2012  . Basal cell carcinoma excision Left     ear, head    Family History  Problem Relation Age of Onset  . Heart disease Mother     s/p CABG age 75  . Cancer Father     colon , brain  . Colon cancer Father   . COPD Sister   . Heart disease Brother     CAD  . Seizures Brother 65    Allergies  Allergen Reactions  .  Aspirin     REACTION: unspecified  . Mercaptopurine     REACTION: myalgias  . Procaine Hcl     REACTION: MAKES HEART RACE    Current Outpatient Prescriptions on File Prior to Visit  Medication Sig Dispense Refill  . albuterol (PROAIR HFA) 108 (90 BASE) MCG/ACT inhaler Inhale 2 puffs into the lungs 4 (four) times daily. (Patient taking differently: Inhale 2 puffs into the lungs every 6 (six) hours as needed. ) 2 Inhaler 6  . ALPRAZolam (XANAX) 0.5 MG tablet Take 0.5 tablets (0.25 mg total) by mouth daily. 30 tablet 2  . amphetamine-dextroamphetamine (ADDERALL) 7.5 MG tablet Take 1 tablet by mouth daily. 30 tablet 0  . amphetamine-dextroamphetamine (ADDERALL) 7.5 MG tablet Take 1 tablet by mouth daily. 30 tablet 0  . amphetamine-dextroamphetamine (ADDERALL) 7.5 MG tablet Take 1 tablet by mouth daily. 30 tablet 0  . atorvastatin (LIPITOR) 40 MG tablet TAKE 1 TABLET (40 MG TOTAL) BY MOUTH DAILY. 30 tablet 4  . Budesonide (PULMICORT  FLEXHALER) 90 MCG/ACT inhaler Inhale 2 puffs into the lungs 2 (two) times daily. (Patient taking differently: Inhale 2 puffs into the lungs 2 (two) times daily as needed. ) 1 Inhaler 5  . buPROPion (WELLBUTRIN SR) 150 MG 12 hr tablet TAKE 1 TABLET BY MOUTH EVERY MORNING 30 tablet 5  . Calcium Carbonate-Vitamin D (CALTRATE 600+D) 600-400 MG-UNIT per tablet Take 1 tablet by mouth daily.    . Cyanocobalamin (VITAMIN B 12 PO) Take 5,000 mcg by mouth daily.    . fexofenadine (ALLEGRA) 180 MG tablet Take 180 mg by mouth daily as needed.    . GuaiFENesin (CHEST CONGESTION RELIEF PO) Take 2 tablets by mouth as needed.    . hyoscyamine (LEVBID) 0.375 MG 12 hr tablet TAKE 1 TABLET BY MOUTH EVERY 12 HOURS AS NEEDED FOR CRAMPING 60 tablet 5  . lisinopril (PRINIVIL,ZESTRIL) 10 MG tablet TAKE 1 TABLET (10 MG TOTAL) BY MOUTH DAILY. 30 tablet 4  . Multiple Vitamin (MULTIVITAMIN) tablet Take 1 tablet by mouth daily.      Marland Kitchen omeprazole (PRILOSEC) 20 MG capsule TAKE 1 CAPSULE (20 MG TOTAL) BY MOUTH DAILY. 30 capsule 12  . predniSONE (DELTASONE) 1 MG tablet TAKE TWO TABLETS BY MOUTH ONCE DAILY 60 tablet 2  . zolpidem (AMBIEN) 10 MG tablet TAKE 1 TABLET(S) BY MOUTH EVERY NIGHT AT BEDTIME AS NEEDED FOR SLEEP 30 tablet 5   No current facility-administered medications on file prior to visit.    BP 128/70 mmHg  Pulse 65  Temp(Src) 98.5 F (36.9 C) (Oral)  Resp 20  Ht 5\' 5"  (1.651 m)  Wt 137 lb (62.143 kg)  BMI 22.80 kg/m2  SpO2 97%  '      Review of Systems  Constitutional: Negative for fever, chills, activity change, appetite change and fatigue.  HENT: Negative for hearing loss, ear pain, congestion, rhinorrhea, sneezing, mouth sores, trouble swallowing, neck pain, neck stiffness, dental problem, voice change, sinus pressure and tinnitus.   Eyes: Negative for photophobia, pain, redness and visual disturbance.  Respiratory: Negative for apnea, cough, choking, chest tightness, shortness of breath and  wheezing.   Cardiovascular: Negative for chest pain, palpitations and leg swelling.  Gastrointestinal: Negative for nausea, vomiting, abdominal pain, diarrhea, constipation, blood in stool, abdominal distention, anal bleeding and rectal pain.  Genitourinary: Negative for dysuria, urgency, frequency, hematuria, flank pain, decreased urine volume, discharge, penile swelling, scrotal swelling, difficulty urinating, genital sores and testicular pain.  Musculoskeletal: Positive for myalgias and arthralgias. Negative  for back pain, joint swelling and gait problem.  Skin: Negative for color change, rash and wound.  Neurological: Negative for dizziness, tremors, seizures, syncope, facial asymmetry, speech difficulty, weakness, light-headedness, numbness and headaches.  Hematological: Negative for adenopathy. Does not bruise/bleed easily.  Psychiatric/Behavioral: Negative for suicidal ideas, hallucinations, behavioral problems, confusion, sleep disturbance, self-injury, dysphoric mood, decreased concentration and agitation. The patient is not nervous/anxious.        Objective:   Physical Exam  Constitutional: He appears well-developed and well-nourished.  HENT:  Head: Normocephalic and atraumatic.  Right Ear: External ear normal.  Left Ear: External ear normal.  Nose: Nose normal.  Mouth/Throat: Oropharynx is clear and moist.  Eyes: Conjunctivae and EOM are normal. Pupils are equal, round, and reactive to light. No scleral icterus.  Neck: Normal range of motion. Neck supple. No JVD present. No thyromegaly present.  Cardiovascular: Regular rhythm and intact distal pulses.  Exam reveals no gallop and no friction rub.   Murmur heard. Grade 2/6 systolic murmur  Pulmonary/Chest: Effort normal and breath sounds normal. He exhibits no tenderness.  Abdominal: Soft. Bowel sounds are normal. He exhibits no distension and no mass. There is no tenderness.  Genitourinary: Prostate normal and penis normal.  Guaiac negative stool.  Prostate +2 enlarged  Musculoskeletal: Normal range of motion. He exhibits no edema and no tenderness.  Lymphadenopathy:    He has no cervical adenopathy.  Neurological: He is alert. He has normal reflexes. No cranial nerve deficit. Coordination normal.  Skin: Skin is warm and dry. No rash noted.  Psychiatric: He has a normal mood and affect. His behavior is normal.          Assessment & Plan:   Preventive health exam Ulcerative colitis Hypertension stable. Blood pressure remains in a low-normal range we'll challenge off lisinopril Dyslipidemia. We'll continue atorvastatin 40 Osteoarthritis adhd. Continue Adderall  Recheck 6 months  Review of Systems  As above    Objective:   Physical Exam    as above     Assessment & Plan:   Preventive health examination A D. D.  Willcontinue reduced dose of Adderall as presently prescribed.  Osteopenia.  Stable  Hypertension stable Ulcerative colitis.  Followup colonoscopy in 2 years Chronic kidney disease, stable  Recheck 6 months

## 2015-08-22 NOTE — Patient Instructions (Signed)
It is important that you exercise regularly, at least 20 minutes 3 to 4 times per week.  If you develop chest pain or shortness of breath seek  medical attention.  Limit your sodium (Salt) intake  Take a calcium supplement, plus [phone] units of vitamin D  Please check your blood pressure on a regular basis.  If it is consistently greater than 150/90, please make an office appointment.  Health Maintenance A healthy lifestyle and preventative care can promote health and wellness.  Maintain regular health, dental, and eye exams.  Eat a healthy diet. Foods like vegetables, fruits, whole grains, low-fat dairy products, and lean protein foods contain the nutrients you need and are low in calories. Decrease your intake of foods high in solid fats, added sugars, and salt. Get information about a proper diet from your health care provider, if necessary.  Regular physical exercise is one of the most important things you can do for your health. Most adults should get at least 150 minutes of moderate-intensity exercise (any activity that increases your heart rate and causes you to sweat) each week. In addition, most adults need muscle-strengthening exercises on 2 or more days a week.   Maintain a healthy weight. The body mass index (BMI) is a screening tool to identify possible weight problems. It provides an estimate of body fat based on height and weight. Your health care provider can find your BMI and can help you achieve or maintain a healthy weight. For males 20 years and older:  A BMI below 18.5 is considered underweight.  A BMI of 18.5 to 24.9 is normal.  A BMI of 25 to 29.9 is considered overweight.  A BMI of 30 and above is considered obese.  Maintain normal blood lipids and cholesterol by exercising and minimizing your intake of saturated fat. Eat a balanced diet with plenty of fruits and vegetables. Blood tests for lipids and cholesterol should begin at age 16 and be repeated every 5  years. If your lipid or cholesterol levels are high, you are over age 21, or you are at high risk for heart disease, you may need your cholesterol levels checked more frequently.Ongoing high lipid and cholesterol levels should be treated with medicines if diet and exercise are not working.  If you smoke, find out from your health care provider how to quit. If you do not use tobacco, do not start.  Lung cancer screening is recommended for adults aged 46-80 years who are at high risk for developing lung cancer because of a history of smoking. A yearly low-dose CT scan of the lungs is recommended for people who have at least a 30-pack-year history of smoking and are current smokers or have quit within the past 15 years. A pack year of smoking is smoking an average of 1 pack of cigarettes a day for 1 year (for example, a 30-pack-year history of smoking could mean smoking 1 pack a day for 30 years or 2 packs a day for 15 years). Yearly screening should continue until the smoker has stopped smoking for at least 15 years. Yearly screening should be stopped for people who develop a health problem that would prevent them from having lung cancer treatment.  If you choose to drink alcohol, do not have more than 2 drinks per day. One drink is considered to be 12 oz (360 mL) of beer, 5 oz (150 mL) of wine, or 1.5 oz (45 mL) of liquor.  Avoid the use of street drugs. Do not  share needles with anyone. Ask for help if you need support or instructions about stopping the use of drugs.  High blood pressure causes heart disease and increases the risk of stroke. Blood pressure should be checked at least every 1-2 years. Ongoing high blood pressure should be treated with medicines if weight loss and exercise are not effective.  If you are 36-55 years old, ask your health care provider if you should take aspirin to prevent heart disease.  Diabetes screening involves taking a blood sample to check your fasting blood sugar  level. This should be done once every 3 years after age 54 if you are at a normal weight and without risk factors for diabetes. Testing should be considered at a younger age or be carried out more frequently if you are overweight and have at least 1 risk factor for diabetes.  Colorectal cancer can be detected and often prevented. Most routine colorectal cancer screening begins at the age of 32 and continues through age 19. However, your health care provider may recommend screening at an earlier age if you have risk factors for colon cancer. On a yearly basis, your health care provider may provide home test kits to check for hidden blood in the stool. A small camera at the end of a tube may be used to directly examine the colon (sigmoidoscopy or colonoscopy) to detect the earliest forms of colorectal cancer. Talk to your health care provider about this at age 61 when routine screening begins. A direct exam of the colon should be repeated every 5-10 years through age 29, unless early forms of precancerous polyps or small growths are found.  People who are at an increased risk for hepatitis B should be screened for this virus. You are considered at high risk for hepatitis B if:  You were born in a country where hepatitis B occurs often. Talk with your health care provider about which countries are considered high risk.  Your parents were born in a high-risk country and you have not received a shot to protect against hepatitis B (hepatitis B vaccine).  You have HIV or AIDS.  You use needles to inject street drugs.  You live with, or have sex with, someone who has hepatitis B.  You are a man who has sex with other men (MSM).  You get hemodialysis treatment.  You take certain medicines for conditions like cancer, organ transplantation, and autoimmune conditions.  Hepatitis C blood testing is recommended for all people born from [birth year] through 1965 and any individual with known risk factors for  hepatitis C.  Healthy men should no longer receive prostate-specific antigen (PSA) blood tests as part of routine cancer screening. Talk to your health care provider about prostate cancer screening.  Testicular cancer screening is not recommended for adolescents or adult males who have no symptoms. Screening includes self-exam, a health care provider exam, and other screening tests. Consult with your health care provider about any symptoms you have or any concerns you have about testicular cancer.  Practice safe sex. Use condoms and avoid high-risk sexual practices to reduce the spread of sexually transmitted infections (STIs).  You should be screened for STIs, including gonorrhea and chlamydia if:  You are sexually active and are younger than 24 years.  You are older than 24 years, and your health care provider tells you that you are at risk for this type of infection.  Your sexual activity has changed since you were last screened, and you are at an  increased risk for chlamydia or gonorrhea. Ask your health care provider if you are at risk.  If you are at risk of being infected with HIV, it is recommended that you take a prescription medicine daily to prevent HIV infection. This is called pre-exposure prophylaxis (PrEP). You are considered at risk if:  You are a man who has sex with other men (MSM).  You are a heterosexual man who is sexually active with multiple partners.  You take drugs by injection.  You are sexually active with a partner who has HIV.  Talk with your health care provider about whether you are at high risk of being infected with HIV. If you choose to begin PrEP, you should first be tested for HIV. You should then be tested every 3 months for as long as you are taking PrEP.  Use sunscreen. Apply sunscreen liberally and repeatedly throughout the day. You should seek shade when your shadow is shorter than you. Protect yourself by wearing long sleeves, pants, a  wide-brimmed hat, and sunglasses year round whenever you are outdoors.  Tell your health care provider of new moles or changes in moles, especially if there is a change in shape or color. Also, tell your health care provider if a mole is larger than the size of a pencil eraser.  A one-time screening for abdominal aortic aneurysm (AAA) and surgical repair of large AAAs by ultrasound is recommended for men aged 62-75 years who are current or former smokers.  Stay current with your vaccines (immunizations). Document Released: 06/13/2008 Document Revised: 12/21/2013 Document Reviewed: 05/13/2011 [facility] Patient Information 2015 [place], [place]. This information is not intended to replace advice given to you by your health care provider. Make sure you discuss any questions you have with your health care provider.

## 2015-08-22 NOTE — Progress Notes (Signed)
Pre visit review using our clinic review tool, if applicable. No additional management support is needed unless otherwise documented below in the visit note. 

## 2015-08-24 ENCOUNTER — Other Ambulatory Visit: Payer: Self-pay | Admitting: Internal Medicine

## 2015-08-24 ENCOUNTER — Telehealth: Payer: Self-pay | Admitting: Internal Medicine

## 2015-08-24 NOTE — Telephone Encounter (Signed)
Patient notified.  He is asked to keep the appts coming up

## 2015-08-24 NOTE — Telephone Encounter (Signed)
Patient reports that Dr. Burnice Logan said he did not need a colon.  I have explained that according to the chart he is due now.  Dr. Carlean Purl please review and advise if ok to wait 2 years, like Dr. Burnice Logan

## 2015-08-24 NOTE — Telephone Encounter (Signed)
Because he has ulcerative colitis he should have a colonoscopy now. He is at higher risk for colon cancer because of the ulcerative colitis. Perhaps Dr. Raliegh Ip does not understand that.

## 2015-08-24 NOTE — Telephone Encounter (Signed)
Per pathology report from 07/21/12 and office visit from 03/17/14 he was due for a 3 year recall.  Patient is due now for the colonoscopy  Left message for patient to call back

## 2015-09-06 ENCOUNTER — Ambulatory Visit (AMBULATORY_SURGERY_CENTER): Payer: Self-pay | Admitting: *Deleted

## 2015-09-06 ENCOUNTER — Other Ambulatory Visit: Payer: Self-pay | Admitting: Internal Medicine

## 2015-09-06 ENCOUNTER — Other Ambulatory Visit: Payer: Self-pay | Admitting: Family Medicine

## 2015-09-06 ENCOUNTER — Telehealth: Payer: Self-pay | Admitting: Internal Medicine

## 2015-09-06 VITALS — Ht 65.0 in | Wt 138.0 lb

## 2015-09-06 DIAGNOSIS — K5190 Ulcerative colitis, unspecified, without complications: Secondary | ICD-10-CM

## 2015-09-06 NOTE — Telephone Encounter (Signed)
Pt would like to know if he should continue his PULMICORT FLEXHALER 90 MCG/ACT inhaler. Pt states dr did not mention continue to use at his cpe. 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 pls advise

## 2015-09-06 NOTE — Progress Notes (Signed)
Patient denies any allergies to eggs or soy. Patient denies any problems with anesthesia/sedation. Patient denies any oxygen use at home and does not take any diet/weight loss medications. Patient declined EMMI information today.

## 2015-09-07 NOTE — Telephone Encounter (Signed)
Pt called back, told him Decrease Pulmicort to one puff twice daily for 30 days; if does well on this dose. Okay to consider discontinuation. Resume Pulmicort if any worsening asthma or increase in need for albuterol per Dr.K. Pt verbalized understanding.

## 2015-09-07 NOTE — Telephone Encounter (Signed)
Left message on voicemail to call office on home and mobile.

## 2015-09-07 NOTE — Telephone Encounter (Signed)
Decrease Pulmicort to one puff twice daily for 30 days; if patient does well on this dose.  Okay to consider discontinuation.  Resume Pulmicort if any worsening asthma or increase in need for albuterol

## 2015-09-07 NOTE — Telephone Encounter (Signed)
Please see message and advise 

## 2015-09-20 ENCOUNTER — Telehealth: Payer: Self-pay | Admitting: Internal Medicine

## 2015-09-20 ENCOUNTER — Ambulatory Visit (AMBULATORY_SURGERY_CENTER): Payer: Managed Care, Other (non HMO) | Admitting: Internal Medicine

## 2015-09-20 ENCOUNTER — Encounter: Payer: Self-pay | Admitting: Internal Medicine

## 2015-09-20 ENCOUNTER — Ambulatory Visit (INDEPENDENT_AMBULATORY_CARE_PROVIDER_SITE_OTHER)
Admission: RE | Admit: 2015-09-20 | Discharge: 2015-09-20 | Disposition: A | Discharge status: Home / Self Care | Payer: Managed Care, Other (non HMO) | Source: Ambulatory Visit | Attending: Internal Medicine | Admitting: Internal Medicine

## 2015-09-20 VITALS — BP 147/65 | HR 57 | Temp 97.3°F | Resp 14 | Ht 65.0 in | Wt 138.0 lb

## 2015-09-20 DIAGNOSIS — Z8719 Personal history of other diseases of the digestive system: Secondary | ICD-10-CM | POA: Diagnosis not present

## 2015-09-20 DIAGNOSIS — K5190 Ulcerative colitis, unspecified, without complications: Secondary | ICD-10-CM | POA: Diagnosis not present

## 2015-09-20 DIAGNOSIS — Z1211 Encounter for screening for malignant neoplasm of colon: Secondary | ICD-10-CM | POA: Diagnosis not present

## 2015-09-20 MED ORDER — SODIUM CHLORIDE 0.9 % IV SOLN: 500.0000 mL | INTRAVENOUS | Status: DC

## 2015-09-20 NOTE — Patient Instructions (Addendum)
Everything looks good - I took the usual biopsies and will let you know.  I appreciate the opportunity to care for you Gatha Mayer, MD, FACG     YOU HAD AN ENDOSCOPIC PROCEDURE TODAY AT [facility]:   Refer to the procedure report that was given to you for any specific questions about what was found during the examination.  If the procedure report does not answer your questions, please call your gastroenterologist to clarify.  If you requested that your care partner not be given the details of your procedure findings, then the procedure report has been included in a sealed envelope for you to review at your convenience later.  YOU SHOULD EXPECT: Some feelings of bloating in the abdomen. Passage of more gas than usual.  Walking can help get rid of the air that was put into your GI tract during the procedure and reduce the bloating. If you had a lower endoscopy (such as a colonoscopy or flexible sigmoidoscopy) you may notice spotting of blood in your stool or on the toilet paper. If you underwent a bowel prep for your procedure, you may not have a normal bowel movement for a few days.  Please Note:  You might notice some irritation and congestion in your nose or some drainage.  This is from the oxygen used during your procedure.  There is no need for concern and it should clear up in a day or so.  SYMPTOMS TO REPORT IMMEDIATELY:   Following lower endoscopy (colonoscopy or flexible sigmoidoscopy):  Excessive amounts of blood in the stool  Significant tenderness or worsening of abdominal pains  Swelling of the abdomen that is new, acute  Fever of 100F or higher    For urgent or emergent issues, a gastroenterologist can be reached at any hour by calling [phone].   DIET: Your first meal following the procedure should be a small meal and then it is ok to progress to your normal diet. Heavy or fried foods are harder to digest and may make you feel nauseous or  bloated.  Likewise, meals heavy in dairy and vegetables can increase bloating.  Drink plenty of fluids but you should avoid alcoholic beverages for 24 hours.  ACTIVITY:  You should plan to take it easy for the rest of today and you should NOT DRIVE or use heavy machinery until tomorrow (because of the sedation medicines used during the test).    FOLLOW UP: Our staff will call the number listed on your records the next business day following your procedure to check on you and address any questions or concerns that you may have regarding the information given to you following your procedure. If we do not reach you, we will leave a message.  However, if you are feeling well and you are not experiencing any problems, there is no need to return our call.  We will assume that you have returned to your regular daily activities without incident.  If any biopsies were taken you will be contacted by phone or by letter within the next 1-3 weeks.  Please call us at [phone] if you have not heard about the biopsies in 3 weeks.    SIGNATURES/CONFIDENTIALITY: You and/or your care partner have signed paperwork which will be entered into your electronic medical record.  These signatures attest to the fact that that the information above on your After Visit Summary has been reviewed and is understood.  Full responsibility of the confidentiality of this  discharge information lies with you and/or your care-partner.   Resume medications.

## 2015-09-20 NOTE — Progress Notes (Signed)
Patient called back c/o LUQ pain radiating into ribs.  I saw him in office - BP 144/70, pulse 70 T 897.8  Reported LUQ pain - was able to pass some gas but then not - had one loose stool with streaks of vblood Also l shlder pain   On exam NAD, smiling Back mo CVAT Lungs CTA abd BS + - mildly tender-mod tender LUQ/mid quad no rebound Cor S1S2   Some gaseous distention of bowel on Acute abd series but no perforation -   A/P  abd pain after colonoscopy likely retained gas  No evidence for perforation He will go home and stay on liquids,   Call back if worse Would do a CT if worse/no better  Gatha Mayer, MD, [facility] [phone] (pager) 09/20/2015 4:31 PM

## 2015-09-20 NOTE — Progress Notes (Signed)
Called to room to assist during endoscopic procedure.  Patient ID and intended procedure confirmed with present staff. Received instructions for my participation in the procedure from the performing physician.  

## 2015-09-20 NOTE — Addendum Note (Signed)
Addended by: Silvano Rusk E on: 09/20/2015 04:01 PM   Modules accepted: Orders

## 2015-09-20 NOTE — Progress Notes (Signed)
Patient awakening,vss,report to rn 

## 2015-09-20 NOTE — Op Note (Signed)
[place]  [address] [place], [id number]   COLONOSCOPY PROCEDURE REPORT  PATIENT: Joseph Mejia, Joseph Mejia  MR#: [id number] BIRTHDATE: 21-Aug-1946 , 78  yrs. old GENDER: male ENDOSCOPIST: Gatha Mayer, MD, [facility] PROCEDURE DATE:  09/20/2015 PROCEDURE:   Colonoscopy, surveillance and Colonoscopy with biopsy Prior Negative Screening - Now for repeat screening.  N/A History of Adenoma - Now for follow-up colonoscopy & has been > or = to 3 yrs.  N/A  Polyps removed today? No Recommend repeat exam, <10 yrs? Yes high risk ASA CLASS:   Class II INDICATIONS:Screening for colonic neoplasia and Inflammatory Bowel Disease ( = 8 years pancolitis or = 15 years left sided colitis).  MEDICATIONS: Propofol 200 mg IV and Monitored anesthesia care  DESCRIPTION OF PROCEDURE:   After the risks benefits and alternatives of the procedure were thoroughly explained, informed consent was obtained.  The digital rectal exam revealed no abnormalities of the rectum, revealed no prostatic nodules, and revealed the prostate was not enlarged.   The LB [id number] endoscope was introduced through the anus and advanced to the cecum, which was identified by both the appendix and ileocecal valve. No adverse events experienced.   The quality of the prep was good.  (MiraLax was used)  The instrument was then slowly withdrawn as the colon was fully examined. Estimated blood loss is zero unless otherwise noted in this procedure report.      COLON FINDINGS: A normal appearing cecum, ileocecal valve, and appendiceal orifice were identified.  The ascending, transverse, descending, sigmoid colon, and rectum appeared unremarkable. Multiple random biopsies were performed using cold forceps.  Sample was obtained and sent to histology.   Small external and internal hemorrhoids were found.  Retroflexed views revealed internal hemorrhoids and Retroflexed views revealed external hemorrhoids. The time to  cecum = 3.7 Withdrawal time = 13.5   The scope was withdrawn and the procedure completed. COMPLICATIONS: There were no immediate complications.  ENDOSCOPIC IMPRESSION: 1.   Normal colonoscopy; multiple random biopsies were performed using cold forceps - Ulcerative Colitis is in endoscopic and clinical remission 2.   Small external and internal hemorrhoids  RECOMMENDATIONS: Timing of repeat colonoscopy will be determined by pathology findings.  eSigned:  Gatha Mayer, MD, [facility] 09/20/2015 9:02 AM   cc: The Patient

## 2015-09-20 NOTE — Telephone Encounter (Signed)
Pt. Called and stated   that he was having pain that goes up under his ribcage of pain level of 8 and that he is having diarrhea abdomen is tender,had small amount of blood in stool,but not enough,denies fever,patient placed on hold and informed Dr. Carlean Purl of patients complaints and doctor Carlean Purl instructed me to tell patient to go to the ED. For evaluation. Pt. Verbalized okay.

## 2015-09-21 ENCOUNTER — Telehealth: Payer: Self-pay

## 2015-09-21 NOTE — Telephone Encounter (Signed)
  Follow up Call-  Call back number 09/20/2015  Post procedure Call Back phone  # [phone]  Permission to leave phone message Yes     Patient questions:  Do you have a fever, pain , or abdominal swelling? No. Pain Score  0 *  Have you tolerated food without any problems? Yes.    Have you been able to return to your normal activities? Yes.    Do you have any questions about your discharge instructions: Diet   No. Medications  No. Follow up visit  No.  Do you have questions or concerns about your Care? No.  Actions: * If pain score is 4 or above: No action needed, pain <4.

## 2015-09-27 ENCOUNTER — Encounter: Payer: Self-pay | Admitting: Internal Medicine

## 2015-09-27 DIAGNOSIS — K5100 Ulcerative (chronic) pancolitis without complications: Secondary | ICD-10-CM

## 2015-10-19 ENCOUNTER — Other Ambulatory Visit: Payer: Self-pay | Admitting: Internal Medicine

## 2015-11-15 ENCOUNTER — Telehealth: Payer: Self-pay | Admitting: Internal Medicine

## 2015-11-15 MED ORDER — AMPHETAMINE-DEXTROAMPHETAMINE 7.5 MG PO TABS
7.5000 mg | ORAL_TABLET | Freq: Every day | ORAL | Status: DC
Start: 2015-11-15 — End: 2016-02-26

## 2015-11-15 MED ORDER — AMPHETAMINE-DEXTROAMPHETAMINE 7.5 MG PO TABS: 7.5000 mg | ORAL_TABLET | Freq: Every day | ORAL | Status: DC

## 2015-11-15 NOTE — Telephone Encounter (Signed)
Pt request refill of the following: amphetamine-dextroamphetamine (ADDERALL) 7.5 MG tablet   Phamacy:

## 2015-11-15 NOTE — Telephone Encounter (Signed)
Left detailed message Rx's ready for pickup, will be at the front desk. Rx's printed and signed by Dr.Todd.

## 2015-12-26 ENCOUNTER — Other Ambulatory Visit: Payer: Self-pay | Admitting: Internal Medicine

## 2016-01-04 ENCOUNTER — Other Ambulatory Visit: Payer: Self-pay | Admitting: Internal Medicine

## 2016-01-10 ENCOUNTER — Encounter: Payer: Self-pay | Admitting: Internal Medicine

## 2016-01-10 ENCOUNTER — Ambulatory Visit (INDEPENDENT_AMBULATORY_CARE_PROVIDER_SITE_OTHER): Payer: Managed Care, Other (non HMO) | Admitting: Internal Medicine

## 2016-01-10 VITALS — BP 120/70 | HR 65 | Temp 98.9°F | Resp 20 | Ht 65.0 in | Wt 133.0 lb

## 2016-01-10 DIAGNOSIS — B9789 Other viral agents as the cause of diseases classified elsewhere: Secondary | ICD-10-CM

## 2016-01-10 DIAGNOSIS — J069 Acute upper respiratory infection, unspecified: Secondary | ICD-10-CM

## 2016-01-10 DIAGNOSIS — I10 Essential (primary) hypertension: Secondary | ICD-10-CM | POA: Diagnosis not present

## 2016-01-10 MED ORDER — HYDROCODONE-HOMATROPINE 5-1.5 MG/5ML PO SYRP: 5.0000 mL | ORAL_SOLUTION | Freq: Four times a day (QID) | ORAL | Status: DC | PRN

## 2016-01-10 NOTE — Patient Instructions (Signed)
Acute bronchitis symptoms for less than 10 days are generally not helped by antibiotics.  Take over-the-counter expectorants and cough medications such as  Mucinex DM.  Call if there is no improvement in 5 to 7 days or if  you develop worsening cough, fever, or new symptoms, such as shortness of breath or chest pain.   

## 2016-01-10 NOTE — Progress Notes (Signed)
Subjective:    Patient ID: Joseph Mejia, male    DOB: [birth year], 70 y.o.   MRN: [id number]  HPI  70 year old patient who presents with a three-day history of rhinorrhea and productive cough.  Yesterday he had fever 201 but this has normalized.  He did have a flu vaccine approximately 2 weeks ago.  Other family members have also been ill.  He generally feels unwell with an episode of vomiting earlier today.  He has had some myalgias and dizziness.  No frank chills.  No diarrhea  Past Medical History  Diagnosis Date  . HYPERLIPIDEMIA 08/03/2007  . ANXIETY 08/03/2007  . DEPRESSION 08/03/2007  . ADD 02/23/2008  . HYPERTENSION 08/03/2007  . ASTHMA 08/03/2007  . GERD 08/03/2007  . UNIVERSAL ULCERATIVE COLITIS 01/26/2009  . Irritable bowel syndrome 11/02/2008  . RENAL DISEASE, CHRONIC, MILD 11/04/2008  . ERECTILE DYSFUNCTION, ORGANIC 02/23/2008  . OSTEOARTHRITIS 11/02/2008  . HIP PAIN, RIGHT 05/27/2008  . HAND PAIN, LEFT 06/20/2009  . OSTEOPENIA 07/26/2008  . CHEST PAIN 11/17/2008  . Closed fracture of four ribs 06/30/2008  . Failed moderate sedation during procedure 11/02/2008    Social History   Social History  . Marital Status: Married    Spouse Name: N/A  . Number of Children: N/A  . Years of Education: N/A   Occupational History  . Not on file.   Social History Main Topics  . Smoking status: Never Smoker   . Smokeless tobacco: Never Used  . Alcohol Use: 4.2 oz/week    7 Glasses of wine per week  . Drug Use: No  . Sexual Activity: Not on file   Other Topics Concern  . Not on file   Social History Narrative    Past Surgical History  Procedure Laterality Date  . Elbow surgery  01/2002    left  . Shoulder surgery  09/2007    injury at work, dislocated  . Incision and drainage perirectal abscess  1998  . Lumbar disc surgery  1992  . Hand surgery  02/2010    basal thumb joint removal and tendon transplant([facility])  . Colonoscopy w/ biopsies  06/2001, 08/2006, 07/2008    colon  polyp, ulcerative colitis, lymphoid aggregates  . Upper gastrointestinal endoscopy  06/2001    2cm hiatal hernia  . Carpal tunnel release  2012  . Anterior fusion cervical spine  2012  . Basal cell carcinoma excision Left     ear, head  . Cervical fusion  May 2016    C7    Family History  Problem Relation Age of Onset  . Heart disease Mother     s/p CABG age 51  . Cancer Father     colon , brain  . Colon cancer Father 72  . COPD Sister   . Heart disease Brother     CAD  . Seizures Brother 43    Allergies  Allergen Reactions  . Aspirin     REACTION: nose bleeds   . Mercaptopurine     REACTION: myalgias  . Procaine Hcl     REACTION: MAKES HEART RACE    Current Outpatient Prescriptions on File Prior to Visit  Medication Sig Dispense Refill  . albuterol (PROAIR HFA) 108 (90 BASE) MCG/ACT inhaler Inhale 2 puffs into the lungs 4 (four) times daily. (Patient taking differently: Inhale 2 puffs into the lungs every 6 (six) hours as needed. ) 2 Inhaler 6  . ALPRAZolam (XANAX) 0.5 MG tablet TAKE 1/2 TABLET(S) BY MOUTH  DAILY 30 tablet 2  . amphetamine-dextroamphetamine (ADDERALL) 7.5 MG tablet Take 1 tablet by mouth daily. 30 tablet 0  . amphetamine-dextroamphetamine (ADDERALL) 7.5 MG tablet Take 1 tablet by mouth daily. 30 tablet 0  . amphetamine-dextroamphetamine (ADDERALL) 7.5 MG tablet Take 1 tablet by mouth daily. 30 tablet 0  . Ascorbic Acid (VITAMIN C) 100 MG tablet Take 100 mg by mouth daily.    Marland Kitchen atorvastatin (LIPITOR) 40 MG tablet TAKE 1 TABLET (40 MG TOTAL) BY MOUTH DAILY. 30 tablet 4  . buPROPion (WELLBUTRIN SR) 150 MG 12 hr tablet TAKE 1 TABLET BY MOUTH EVERY MORNING 30 tablet 5  . Calcium Carbonate-Vitamin D (CALTRATE 600+D) 600-400 MG-UNIT per tablet Take 2 tablets by mouth daily.     . Cyanocobalamin (VITAMIN B 12 PO) Take 5,000 mcg by mouth daily.    . fexofenadine (ALLEGRA) 180 MG tablet Take 180 mg by mouth daily as needed.    . hyoscyamine (LEVBID) 0.375 MG 12 hr  tablet TAKE 1 TABLET BY MOUTH EVERY 12 HOURS AS NEEDED FOR CRAMPING 60 tablet 5  . lisinopril (PRINIVIL,ZESTRIL) 10 MG tablet TAKE 1 TABLET (10 MG TOTAL) BY MOUTH DAILY. 30 tablet 5  . Multiple Vitamin (MULTIVITAMIN) tablet Take 1 tablet by mouth daily.      Marland Kitchen omeprazole (PRILOSEC) 20 MG capsule TAKE 1 CAPSULE (20 MG TOTAL) BY MOUTH DAILY. 30 capsule 12  . predniSONE (DELTASONE) 1 MG tablet TAKE TWO TABLETS BY MOUTH ONCE DAILY 60 tablet 5  . PULMICORT FLEXHALER 90 MCG/ACT inhaler INHALE 2 PUFFS INTO THE LUNGS 2 (TWO) TIMES DAILY. 1 each 4  . zolpidem (AMBIEN) 10 MG tablet TAKE 1 TABLET BY MOUTH AT BEDTIME AS NEEDED FOR INSOMNIA 30 tablet 2   No current facility-administered medications on file prior to visit.    BP 120/70 mmHg  Pulse 65  Temp(Src) 98.9 F (37.2 C) (Oral)  Resp 20  Ht 5\' 5"  (1.651 m)  Wt 133 lb (60.328 kg)  BMI 22.13 kg/m2  SpO2 93%     Review of Systems  Constitutional: Positive for fever, activity change, appetite change and fatigue. Negative for chills and diaphoresis.  HENT: Positive for postnasal drip. Negative for congestion, dental problem, ear pain, hearing loss, sore throat, tinnitus, trouble swallowing and voice change.   Eyes: Negative for pain, discharge and visual disturbance.  Respiratory: Positive for cough. Negative for chest tightness, wheezing and stridor.   Cardiovascular: Negative for chest pain, palpitations and leg swelling.  Gastrointestinal: Positive for nausea. Negative for vomiting, abdominal pain, diarrhea, constipation, blood in stool and abdominal distention.  Genitourinary: Negative for urgency, hematuria, flank pain, discharge, difficulty urinating and genital sores.  Musculoskeletal: Negative for myalgias, back pain, joint swelling, arthralgias, gait problem and neck stiffness.  Skin: Negative for rash.  Neurological: Positive for dizziness and light-headedness. Negative for syncope, speech difficulty, weakness, numbness and headaches.   Hematological: Negative for adenopathy. Does not bruise/bleed easily.  Psychiatric/Behavioral: Negative for behavioral problems and dysphoric mood. The patient is not nervous/anxious.        Objective:   Physical Exam  Constitutional: He is oriented to person, place, and time. He appears well-developed.  Afebrile Normal blood pressure Pulse 65 Appears unwell, but in no acute distress  HENT:  Head: Normocephalic.  Right Ear: External ear normal.  Left Ear: External ear normal.  Eyes: Conjunctivae and EOM are normal.  Neck: Normal range of motion.  Cardiovascular: Normal rate and normal heart sounds.   Pulmonary/Chest: Breath sounds normal.  No respiratory distress. He has no wheezes. He has no rales.  Abdominal: Bowel sounds are normal.  Musculoskeletal: Normal range of motion. He exhibits no edema or tenderness.  Neurological: He is alert and oriented to person, place, and time.  Psychiatric: He has a normal mood and affect. His behavior is normal.          Assessment & Plan:   Viral URI with cough.  Will treat symptomatically.  Patient encouraged to force fluids Hypertension, stable

## 2016-01-16 ENCOUNTER — Encounter: Payer: Self-pay | Admitting: Cardiology

## 2016-01-16 ENCOUNTER — Encounter: Payer: Self-pay | Admitting: Internal Medicine

## 2016-01-21 ENCOUNTER — Other Ambulatory Visit: Payer: Self-pay | Admitting: Internal Medicine

## 2016-02-08 IMAGING — CR DG ABDOMEN ACUTE W/ 1V CHEST
3 series · 3 of 3 positions shown · non-contrast
Comparison: None.

CLINICAL DATA: Left upper and lower quadrant pain, post
colonoscopy. History of ulcerative colitis.

EXAM:
DG ABDOMEN ACUTE W/ 1V CHEST

[view not recorded (1 of 3)]
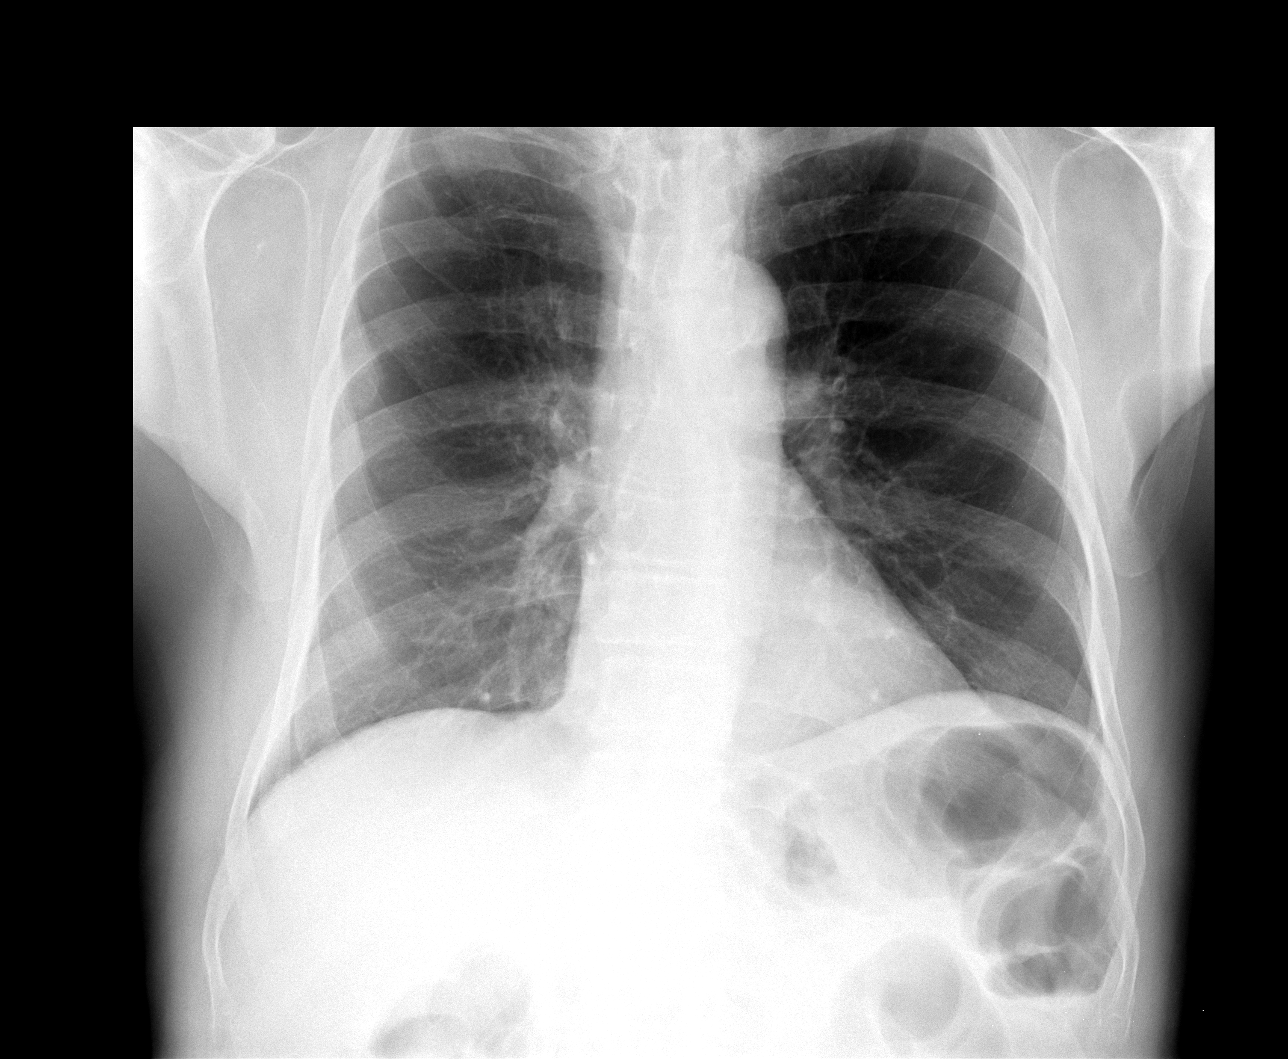

[view not recorded (2 of 3)]
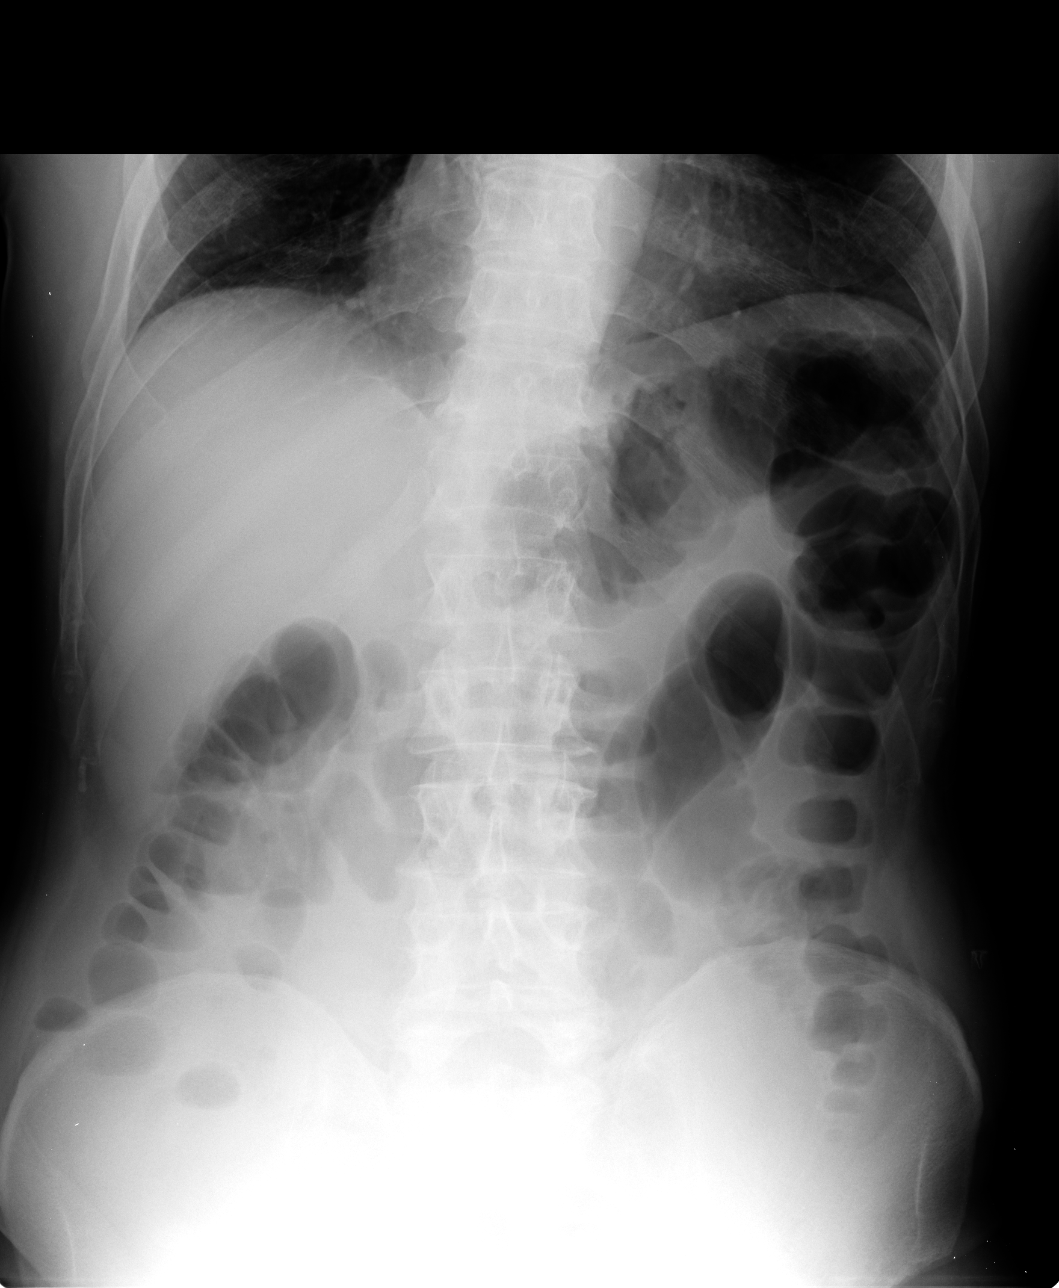

[view not recorded (3 of 3)]
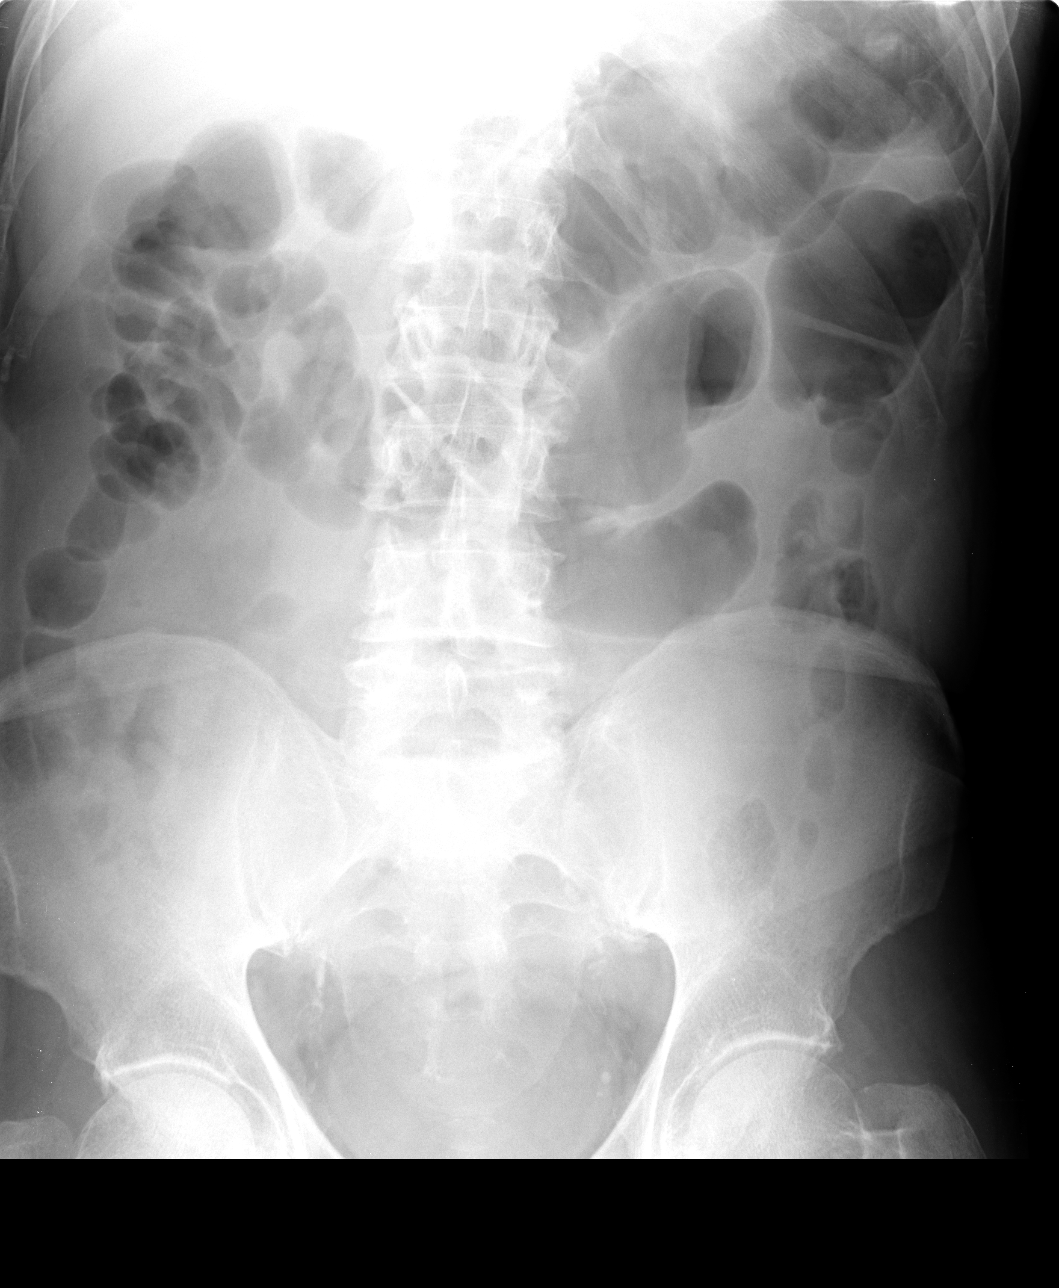

[3 of 3 positions shown; findings below may reference images not displayed]

FINDINGS: Diffuse gaseous distention of bowel, nonspecific. No convincing
evidence for bowel obstruction. No free air. No organomegaly or
suspicious calcification. No acute bony abnormality.

Heart and mediastinal contours are within normal limits. No focal
opacities or effusions. No acute bony abnormality.
IMPRESSION: Mild diffuse gaseous distention of bowel without evidence for
obstruction.

No acute cardiopulmonary disease.

## 2016-02-10 ENCOUNTER — Other Ambulatory Visit: Payer: Self-pay | Admitting: Internal Medicine

## 2016-02-18 ENCOUNTER — Other Ambulatory Visit: Payer: Self-pay | Admitting: Internal Medicine

## 2016-02-22 ENCOUNTER — Ambulatory Visit: Payer: Managed Care, Other (non HMO) | Admitting: Internal Medicine

## 2016-02-23 ENCOUNTER — Encounter: Payer: Self-pay | Admitting: Internal Medicine

## 2016-02-23 ENCOUNTER — Ambulatory Visit: Payer: Managed Care, Other (non HMO) | Admitting: Internal Medicine

## 2016-02-26 ENCOUNTER — Other Ambulatory Visit: Payer: Self-pay | Admitting: Internal Medicine

## 2016-02-26 ENCOUNTER — Telehealth: Payer: Self-pay | Admitting: Internal Medicine

## 2016-02-26 MED ORDER — AMPHETAMINE-DEXTROAMPHETAMINE 7.5 MG PO TABS: 7.5000 mg | ORAL_TABLET | Freq: Every day | ORAL | Status: DC

## 2016-02-26 NOTE — Telephone Encounter (Signed)
Pt notified Rx's ready for pickup. Rx's printed and signed.  

## 2016-02-26 NOTE — Telephone Encounter (Signed)
Pt request refill of the following: amphetamine-dextroamphetamine (ADDERALL) 7.5 MG tablet   Phamacy:

## 2016-02-27 ENCOUNTER — Other Ambulatory Visit: Payer: Self-pay | Admitting: Internal Medicine

## 2016-03-06 ENCOUNTER — Telehealth: Payer: Self-pay | Admitting: Internal Medicine

## 2016-03-06 NOTE — Telephone Encounter (Signed)
Okay to increase?

## 2016-03-06 NOTE — Telephone Encounter (Signed)
Dr. Raliegh Ip, please see message and advise if okay to increase Alprazolam to one whole tablet 0.5 mg instead of 1/2 tablet of 0.5 mg.

## 2016-03-06 NOTE — Telephone Encounter (Signed)
Wife called for pt. She states pt would like to start taking a whole .05 mg ALPRAZolam (XANAX)  Instead of one half of his ALPRAZolam (XANAX) 0.5 MG tablet as directed. Wife states pt is agitated, they have a lot going on, and she told him she would call for him today.  Pt needs a new prescription. Pt has 12 of the .05 left, so that would be 12 days if Dr Raliegh Ip says it is ok to increase.  [facility]

## 2016-03-07 MED ORDER — ALPRAZOLAM 0.5 MG PO TABS: 0.5000 mg | ORAL_TABLET | Freq: Every day | ORAL | Status: DC

## 2016-03-07 NOTE — Telephone Encounter (Signed)
Spoke to pt's wife Hoyle Sauer, told her okay for pt to increase Alprazolam to one tablet daily. New Rx called into pharmacy. Hoyle Sauer verbalized understanding and will let pt know.

## 2016-03-08 ENCOUNTER — Other Ambulatory Visit: Payer: Self-pay | Admitting: Internal Medicine

## 2016-03-27 ENCOUNTER — Other Ambulatory Visit: Payer: Self-pay | Admitting: Internal Medicine

## 2016-04-08 ENCOUNTER — Other Ambulatory Visit: Payer: Self-pay | Admitting: Internal Medicine

## 2016-04-18 ENCOUNTER — Telehealth: Payer: Self-pay | Admitting: Internal Medicine

## 2016-04-18 MED ORDER — BUDESONIDE 90 MCG/ACT IN AEPB: INHALATION_SPRAY | RESPIRATORY_TRACT | Status: DC

## 2016-04-18 NOTE — Telephone Encounter (Signed)
Rx sent to pharmacy   

## 2016-04-18 NOTE — Telephone Encounter (Signed)
Pt insurance will not cover pulmicort. Pt needs generic send to [facility] in [place]

## 2016-04-19 ENCOUNTER — Telehealth: Payer: Self-pay | Admitting: *Deleted

## 2016-04-19 MED ORDER — BECLOMETHASONE DIPROPIONATE 80 MCG/ACT IN AERS: 2.0000 | INHALATION_SPRAY | Freq: Every day | RESPIRATORY_TRACT | Status: DC

## 2016-04-19 NOTE — Telephone Encounter (Signed)
Spoke to pt, told him needed to change inhaler Pulmicort to Qvar due to insurance would not cover. Pt verbalized understanding. New Rx sent to pharmacy. Pt verbalized understanding.

## 2016-05-20 ENCOUNTER — Other Ambulatory Visit: Payer: Self-pay | Admitting: Internal Medicine

## 2016-05-20 NOTE — Telephone Encounter (Signed)
Last filled on 03/07/16 for 3 months Last seen on 01/10/16 and no upcoming appts. Please advise.  Thanks!

## 2016-05-20 NOTE — Telephone Encounter (Signed)
Okay to refill? 

## 2016-05-21 NOTE — Telephone Encounter (Signed)
Fill for 6 months per Pedro Earls.  Called to the pharmacy and left on machine.

## 2016-05-31 ENCOUNTER — Other Ambulatory Visit: Payer: Self-pay | Admitting: Internal Medicine

## 2016-05-31 MED ORDER — AMPHETAMINE-DEXTROAMPHETAMINE 7.5 MG PO TABS: 7.5000 mg | ORAL_TABLET | Freq: Every day | ORAL | Status: DC

## 2016-05-31 NOTE — Telephone Encounter (Signed)
Pt need new Rx for Adderall 7.5 mg  (90 day supply).

## 2016-05-31 NOTE — Telephone Encounter (Signed)
Pt notified Rx's ready for pickup. Rx's printed and signed.  

## 2016-06-26 ENCOUNTER — Other Ambulatory Visit: Payer: Self-pay | Admitting: Internal Medicine

## 2016-06-26 NOTE — Telephone Encounter (Signed)
Pt would like to have all of his Rx refilled for 90 days at [facility] in [place].

## 2016-06-27 MED ORDER — ZOLPIDEM TARTRATE 10 MG PO TABS: 10.0000 mg | ORAL_TABLET | Freq: Every day | ORAL | Status: DC

## 2016-06-27 MED ORDER — ALBUTEROL SULFATE HFA 108 (90 BASE) MCG/ACT IN AERS: 2.0000 | INHALATION_SPRAY | Freq: Four times a day (QID) | RESPIRATORY_TRACT | Status: DC

## 2016-06-27 MED ORDER — ALPRAZOLAM 0.5 MG PO TABS: 0.5000 mg | ORAL_TABLET | Freq: Every day | ORAL | Status: DC

## 2016-06-27 MED ORDER — PREDNISONE 1 MG PO TABS: 2.0000 mg | ORAL_TABLET | Freq: Every day | ORAL | Status: DC

## 2016-06-27 MED ORDER — OMEPRAZOLE 20 MG PO CPDR: DELAYED_RELEASE_CAPSULE | ORAL | Status: DC

## 2016-06-27 MED ORDER — BUPROPION HCL ER (SR) 150 MG PO TB12: 150.0000 mg | ORAL_TABLET | Freq: Every morning | ORAL | Status: DC

## 2016-06-27 MED ORDER — HYOSCYAMINE SULFATE ER 0.375 MG PO TB12: ORAL_TABLET | ORAL | Status: DC

## 2016-06-27 MED ORDER — ATORVASTATIN CALCIUM 40 MG PO TABS: ORAL_TABLET | ORAL | Status: DC

## 2016-06-27 MED ORDER — LISINOPRIL 10 MG PO TABS: ORAL_TABLET | ORAL | Status: DC

## 2016-06-27 NOTE — Telephone Encounter (Signed)
Looks like this pt was a no show on 02/23/2016, not sure if okay to refill medications?

## 2016-06-27 NOTE — Telephone Encounter (Signed)
Left detailed message on personal voicemail that Rx's were sent to pharmacy as requested. Any questions please call office.  Pt was seen in Jan and has physical schedule for Sept.

## 2016-09-03 ENCOUNTER — Telehealth: Payer: Self-pay | Admitting: Internal Medicine

## 2016-09-03 MED ORDER — AMPHETAMINE-DEXTROAMPHETAMINE 7.5 MG PO TABS: 7.5000 mg | ORAL_TABLET | Freq: Every day | ORAL | 0 refills | Status: DC

## 2016-09-03 NOTE — Telephone Encounter (Signed)
Rx's for Adderall printed and signed by Dr.K and given to pt's wife Hoyle Sauer.

## 2016-09-03 NOTE — Telephone Encounter (Signed)
Joseph Mejia is here for a lab appointment today and would like to pick up Joseph Mejia Rx for   amphetamine-dextroamphetamine (ADDERALL) 7.5 MG tablet   She said that she will wait for the Rx since they live in [place]

## 2016-09-16 ENCOUNTER — Other Ambulatory Visit (INDEPENDENT_AMBULATORY_CARE_PROVIDER_SITE_OTHER): Payer: Managed Care, Other (non HMO)

## 2016-09-16 DIAGNOSIS — Z0000 Encounter for general adult medical examination without abnormal findings: Secondary | ICD-10-CM | POA: Diagnosis not present

## 2016-09-16 LAB — CBC WITH DIFFERENTIAL/PLATELET
Basophils Absolute: 0 10*3/uL (ref 0.0–0.1)
Basophils Relative: 0.7 % (ref 0.0–3.0)
Eosinophils Absolute: 0.7 10*3/uL (ref 0.0–0.7)
Eosinophils Relative: 12.3 % — ABNORMAL HIGH (ref 0.0–5.0)
HCT: 38.6 % — ABNORMAL LOW (ref 39.0–52.0)
Hemoglobin: 13.2 g/dL (ref 13.0–17.0)
Lymphocytes Relative: 33.8 % (ref 12.0–46.0)
Lymphs Abs: 2 10*3/uL (ref 0.7–4.0)
MCHC: 34.3 g/dL (ref 30.0–36.0)
MCV: 91.8 fl (ref 78.0–100.0)
Monocytes Absolute: 0.7 10*3/uL (ref 0.1–1.0)
Monocytes Relative: 11.4 % (ref 3.0–12.0)
Neutro Abs: 2.5 10*3/uL (ref 1.4–7.7)
Neutrophils Relative %: 41.8 % — ABNORMAL LOW (ref 43.0–77.0)
Platelets: 198 10*3/uL (ref 150.0–400.0)
RBC: 4.21 Mil/uL — ABNORMAL LOW (ref 4.22–5.81)
RDW: 13.1 % (ref 11.5–15.5)
WBC: 6 10*3/uL (ref 4.0–10.5)

## 2016-09-16 LAB — POC URINALSYSI DIPSTICK (AUTOMATED)
BILIRUBIN UA: NEGATIVE
Glucose, UA: NEGATIVE
KETONES UA: NEGATIVE
Leukocytes, UA: NEGATIVE
NITRITE UA: NEGATIVE
PH UA: 6
Protein, UA: NEGATIVE
RBC UA: NEGATIVE
SPEC GRAV UA: 1.015
Urobilinogen, UA: 0.2

## 2016-09-16 LAB — LIPID PANEL
Cholesterol: 149 mg/dL (ref 0–200)
HDL: 46.8 mg/dL (ref >39.00)
LDL Cholesterol: 81 mg/dL (ref 0–99)
NONHDL: 101.81
Total CHOL/HDL Ratio: 3
Triglycerides: 106 mg/dL (ref 0.0–149.0)
VLDL: 21.2 mg/dL (ref 0.0–40.0)

## 2016-09-16 LAB — HEPATIC FUNCTION PANEL
ALBUMIN: 3.7 g/dL (ref 3.5–5.2)
ALK PHOS: 62 U/L (ref 39–117)
ALT: 27 U/L (ref 0–53)
AST: 26 U/L (ref 0–37)
Bilirubin, Direct: 0.1 mg/dL (ref 0.0–0.3)
Total Bilirubin: 0.3 mg/dL (ref 0.2–1.2)
Total Protein: 6.5 g/dL (ref 6.0–8.3)

## 2016-09-16 LAB — BASIC METABOLIC PANEL
BUN: 23 mg/dL (ref 6–23)
CO2: 31 mEq/L (ref 19–32)
Calcium: 8.7 mg/dL (ref 8.4–10.5)
Chloride: 106 mEq/L (ref 96–112)
Creatinine, Ser: 1.44 mg/dL (ref 0.40–1.50)
GFR: 51.55 mL/min — ABNORMAL LOW (ref >60.00)
Glucose, Bld: 94 mg/dL (ref 70–99)
Potassium: 4 mEq/L (ref 3.5–5.1)
Sodium: 143 mEq/L (ref 135–145)

## 2016-09-16 LAB — TSH: TSH: 1.95 u[IU]/mL (ref 0.35–4.50)

## 2016-09-23 ENCOUNTER — Ambulatory Visit (INDEPENDENT_AMBULATORY_CARE_PROVIDER_SITE_OTHER): Payer: Managed Care, Other (non HMO) | Admitting: Internal Medicine

## 2016-09-23 ENCOUNTER — Encounter: Payer: Self-pay | Admitting: Internal Medicine

## 2016-09-23 VITALS — BP 110/78 | HR 73 | Temp 98.4°F | Resp 18 | Ht 65.25 in | Wt 136.0 lb

## 2016-09-23 DIAGNOSIS — M150 Primary generalized (osteo)arthritis: Secondary | ICD-10-CM | POA: Diagnosis not present

## 2016-09-23 DIAGNOSIS — M159 Polyosteoarthritis, unspecified: Secondary | ICD-10-CM

## 2016-09-23 DIAGNOSIS — K5100 Ulcerative (chronic) pancolitis without complications: Secondary | ICD-10-CM

## 2016-09-23 DIAGNOSIS — Z0000 Encounter for general adult medical examination without abnormal findings: Secondary | ICD-10-CM

## 2016-09-23 DIAGNOSIS — I10 Essential (primary) hypertension: Secondary | ICD-10-CM

## 2016-09-23 DIAGNOSIS — M8949 Other hypertrophic osteoarthropathy, multiple sites: Secondary | ICD-10-CM

## 2016-09-23 DIAGNOSIS — E785 Hyperlipidemia, unspecified: Secondary | ICD-10-CM

## 2016-09-23 MED ORDER — ESCITALOPRAM OXALATE 5 MG PO TABS: 5.0000 mg | ORAL_TABLET | Freq: Every day | ORAL | 3 refills | Status: DC

## 2016-09-23 NOTE — Patient Instructions (Signed)
Limit your sodium (Salt) intake  Please check your blood pressure on a regular basis.  If it is consistently greater than 150/90, please make an office appointment.  Return in 6 months for follow-up   

## 2016-09-23 NOTE — Progress Notes (Signed)
Pre visit review using our clinic review tool, if applicable. No additional management support is needed unless otherwise documented below in the visit note. 

## 2016-09-23 NOTE — Progress Notes (Signed)
Subjective:    Patient ID: Joseph Mejia, male    DOB: [birth year], 70 y.o.   MRN: [id number]  HPI     Patient ID: Joseph Mejia, male   DOB: 1946/05/13, 70 y.o.   MRN: [id number]  Subjective:    Patient ID: Joseph Mejia, male    DOB: 1946/04/10, 70 y.o.   MRN: [id number]  HPI  70 year-old patient who is in today for a wellness exam.  He is followed by GI due to universal colitis.  He has had C-spine surgery in November of 2014 and as well as a right-sided carpal toe release in December of 2014. He has a history of anxiety and ADHD and is now followed by Dr. Cheryln Manly.  He has a history of asthma, which has been stable.  He uses rescue medication rarely His prednisone dose is down to 1 mg twice a day.  His last colonoscopy was 2016.  His colitis was in remission at that time He did have a follow-up bone density in the spring of this year.  He has osteopenia but showed a 5% improvement in the femoral neck T score.     Past Medical History:  Diagnosis Date  . ADD 02/23/2008  . ANXIETY 08/03/2007  . ASTHMA 08/03/2007  . CHEST PAIN 11/17/2008  . Closed fracture of four ribs 06/30/2008  . DEPRESSION 08/03/2007  . ERECTILE DYSFUNCTION, ORGANIC 02/23/2008  . Failed moderate sedation during procedure 11/02/2008  . GERD 08/03/2007  . HAND PAIN, LEFT 06/20/2009  . HIP PAIN, RIGHT 05/27/2008  . HYPERLIPIDEMIA 08/03/2007  . HYPERTENSION 08/03/2007  . Irritable bowel syndrome 11/02/2008  . OSTEOARTHRITIS 11/02/2008  . OSTEOPENIA 07/26/2008  . RENAL DISEASE, CHRONIC, MILD 11/04/2008  . UNIVERSAL ULCERATIVE COLITIS 01/26/2009    Social History   Social History  . Marital status: Married    Spouse name: N/A  . Number of children: N/A  . Years of education: N/A   Occupational History  . Not on file.   Social History Main Topics  . Smoking status: Never Smoker  . Smokeless tobacco: Never Used  . Alcohol use 4.2 oz/week    7 Glasses of wine per week  . Drug use: No  . Sexual activity: Not  on file   Other Topics Concern  . Not on file   Social History Narrative  . No narrative on file    Past Surgical History:  Procedure Laterality Date  . ANTERIOR FUSION CERVICAL SPINE  2012  . BASAL CELL CARCINOMA EXCISION Left    ear, head  . CARPAL TUNNEL RELEASE  2012  . CERVICAL FUSION  May 2016   C7  . COLONOSCOPY W/ BIOPSIES  06/2001, 08/2006, 07/2008   colon polyp, ulcerative colitis, lymphoid aggregates  . ELBOW SURGERY  01/2002   left  . HAND SURGERY  02/2010   basal thumb joint removal and tendon transplant([facility])  . INCISION AND DRAINAGE PERIRECTAL ABSCESS  1998  . [place]  . SHOULDER SURGERY  09/2007   injury at work, dislocated  . UPPER GASTROINTESTINAL ENDOSCOPY  06/2001   2cm hiatal hernia    Family History  Problem Relation Age of Onset  . Heart disease Mother     s/p CABG age 43  . Cancer Father     colon , brain  . Colon cancer Father 74  . COPD Sister   . Heart disease Brother     CAD  . Seizures Brother 98  Allergies  Allergen Reactions  . Aspirin     REACTION: nose bleeds   . Mercaptopurine     REACTION: myalgias  . Procaine Hcl     REACTION: MAKES HEART RACE    Current Outpatient Prescriptions on File Prior to Visit  Medication Sig Dispense Refill  . ALPRAZolam (XANAX) 0.5 MG tablet Take 1 tablet (0.5 mg total) by mouth daily. 90 tablet 1  . amphetamine-dextroamphetamine (ADDERALL) 7.5 MG tablet Take 1 tablet by mouth daily. 30 tablet 0  . amphetamine-dextroamphetamine (ADDERALL) 7.5 MG tablet Take 1 tablet by mouth daily. 30 tablet 0  . amphetamine-dextroamphetamine (ADDERALL) 7.5 MG tablet Take 1 tablet by mouth daily. 30 tablet 0  . atorvastatin (LIPITOR) 40 MG tablet TAKE 1 TABLET (40 MG TOTAL) BY MOUTH DAILY. 90 tablet 1  . beclomethasone (QVAR) 80 MCG/ACT inhaler Inhale 2 puffs into the lungs daily. 3 Inhaler 3  . buPROPion (WELLBUTRIN SR) 150 MG 12 hr tablet Take 1 tablet (150 mg total) by mouth every morning.  90 tablet 1  . Calcium Carbonate-Vitamin D (CALTRATE 600+D) 600-400 MG-UNIT per tablet Take 2 tablets by mouth daily.     . Cyanocobalamin (VITAMIN B 12 PO) Take 5,000 mcg by mouth daily.    . fexofenadine (ALLEGRA) 180 MG tablet Take 180 mg by mouth daily as needed.    . hyoscyamine (LEVBID) 0.375 MG 12 hr tablet TAKE 1 TABLET(S) BY MOUTH EVERY 12 HOURS AS NEEDED FOR CRAMPING 90 tablet 1  . lisinopril (PRINIVIL,ZESTRIL) 10 MG tablet TAKE 1 TABLET (10 MG TOTAL) BY MOUTH DAILY. 90 tablet 1  . Multiple Vitamin (MULTIVITAMIN) tablet Take 1 tablet by mouth daily.      Marland Kitchen omeprazole (PRILOSEC) 20 MG capsule TAKE 1 CAPSULE (20 MG TOTAL) BY MOUTH DAILY. 90 capsule 1  . predniSONE (DELTASONE) 1 MG tablet Take 2 tablets (2 mg total) by mouth daily. 180 tablet 1  . zolpidem (AMBIEN) 10 MG tablet Take 1 tablet (10 mg total) by mouth at bedtime. 90 tablet 1   No current facility-administered medications on file prior to visit.     BP 110/78 (BP Location: Left Arm, Patient Position: Sitting, Cuff Size: Normal)   Pulse 73   Temp 98.4 F (36.9 C) (Oral)   Resp 18   Ht 5' 5.25" (1.657 m)   Wt 136 lb (61.7 kg)   SpO2 98%   BMI 22.46 kg/m   '      Review of Systems  Constitutional: Negative for fever, chills, activity change, appetite change and fatigue.  HENT: Negative for hearing loss, ear pain, congestion, rhinorrhea, sneezing, mouth sores, trouble swallowing, neck pain, neck stiffness, dental problem, voice change, sinus pressure and tinnitus.   Eyes: Negative for photophobia, pain, redness and visual disturbance.  Respiratory: Negative for apnea, cough, choking, chest tightness, shortness of breath and wheezing.   Cardiovascular: Negative for chest pain, palpitations and leg swelling.  Gastrointestinal: Negative for nausea, vomiting, abdominal pain, diarrhea, constipation, blood in stool, abdominal distention, anal bleeding and rectal pain.  Genitourinary: Negative for dysuria, urgency,  frequency, hematuria, flank pain, decreased urine volume, discharge, penile swelling, scrotal swelling, difficulty urinating, genital sores and testicular pain.  Musculoskeletal: Positive for myalgias and arthralgias. Negative for back pain, joint swelling and gait problem.  Skin: Negative for color change, rash and wound.  Neurological: Negative for dizziness, tremors, seizures, syncope, facial asymmetry, speech difficulty, weakness, light-headedness, numbness and headaches.  Hematological: Negative for adenopathy. Does not bruise/bleed easily.  Psychiatric/Behavioral: Negative  for suicidal ideas, hallucinations, behavioral problems, confusion, sleep disturbance, self-injury, dysphoric mood, decreased concentration and agitation. The patient is not nervous/anxious.        Objective:   Physical Exam  Constitutional: He appears well-developed and well-nourished.  HENT:  Head: Normocephalic and atraumatic.  Right Ear: External ear normal.  Left Ear: External ear normal.  Nose: Nose normal.  Mouth/Throat: Oropharynx is clear and moist.  Eyes: Conjunctivae and EOM are normal. Pupils are equal, round, and reactive to light. No scleral icterus.  Neck: Normal range of motion. Neck supple. No JVD present. No thyromegaly present.  Cardiovascular: Regular rhythm and intact distal pulses.  Exam reveals no gallop and no friction rub.   Murmur heard. Grade 2/6 systolic murmur  Pulmonary/Chest: Effort normal and breath sounds normal. He exhibits no tenderness.  Abdominal: Soft. Bowel sounds are normal. He exhibits no distension and no mass. There is no tenderness.  Genitourinary: Prostate normal and penis normal. Guaiac negative stool.  Prostate +2 enlarged  Musculoskeletal: Normal range of motion. He exhibits no edema and no tenderness.  Lymphadenopathy:    He has no cervical adenopathy.  Neurological: He is alert. He has normal reflexes. No cranial nerve deficit. Coordination normal.  Skin: Skin  is warm and dry. No rash noted.  Psychiatric: He has a normal mood and affect. His behavior is normal.          Assessment & Plan:   Preventive health exam Ulcerative colitis Hypertension stable. Blood pressure remains in a low-normal range we'll challenge off lisinopril Dyslipidemia. We'll continue atorvastatin 40 Osteoarthritis adhd. Continue Adderall Osteoporosis.  Continue Fosamax Anxiety, depression.  Will augment with Lexapro 5  Recheck 6 months  Review of Systems  As above    Objective:   Physical Exam    as above     Assessment & Plan:   Preventive health examination A D. D.  Willcontinue reduced dose of Adderall as presently prescribed. Patient is now retired.  He will consider a taper and discontinuation of Adderall Osteopenia.  Stable Anxiety, depression.  Will augment with low-dose Lexapro 5  Hypertension stable.  Discontinued hydrochlorothiazide.  We'll put lisinopril on hold and follow blood pressures carefully Ulcerative colitis.  Followup colonoscopy in 2 years Chronic kidney disease, stable  Recheck 6 months  Betty Daidone Pilar Plate

## 2016-10-02 ENCOUNTER — Telehealth: Payer: Self-pay | Admitting: Internal Medicine

## 2016-10-02 NOTE — Telephone Encounter (Signed)
Called and spoke with pt. Pt states that it was his blood pressure medication he stopped. Pt states that he was getting elevated blood pressure readings while off the lisinopril 180 's and higher and getting a headache so he started back on lisinopril yesterday. After talking the medication yesterday morning and today is when he got the reading of 131/76 . Discussed pt's concern with Dr. Elease Hashimoto and he informed me that it would be fine for pt to continue medication until he is able to see Dr. Raliegh Ip 10/16. Dr.Burchette states he doesn't see any contradiction in him taking the medication. Pt is aware and verbalized understanding.

## 2016-10-02 NOTE — Telephone Encounter (Signed)
[facility] - Client [facility] Patient Name: Joseph Mejia DOB: Aug 28, 1946 Initial Comment Caller was taken off cholesterol meds on Sept 25th and yesterday had a severe headache, checked BP and it was high. today it is 131/76 Nurse Assessment Nurse: Dimas Chyle, RN, Dellis Filbert Date/Time Eilene Ghazi Time): 10/02/2016 2:54:52 PM Confirm and document reason for call. If symptomatic, describe symptoms. You must click the next button to save text entered. ---Caller was taken off cholesterol meds on Sept 25th and yesterday had a severe headache, checked BP and it was high. today it is 131/76. On escitalopram 5 mg in place of wellbutrin. Has the patient traveled out of the country within the last 30 days? ---No Does the patient have any new or worsening symptoms? ---Yes Will a triage be completed? ---Yes Related visit to physician within the last 2 weeks? ---No Does the PT have any chronic conditions? (i.e. diabetes, asthma, etc.) ---Yes List chronic conditions. ---depression Is this a behavioral health or substance abuse call? ---No Guidelines Guideline Title Affirmed Question Affirmed Notes High Blood Pressure [1] BP # 130/80 AND [2] history of heart problems, kidney disease or diabetes Final Disposition User See PCP within 2 Unk Pinto, RN, Dellis Filbert Comments Caller was wanting callback from office to see if B/P meds should be resumed until appointment with PCP on 10/14/16. Disagree/Comply: Comply

## 2016-10-14 ENCOUNTER — Ambulatory Visit (INDEPENDENT_AMBULATORY_CARE_PROVIDER_SITE_OTHER): Payer: Managed Care, Other (non HMO) | Admitting: Internal Medicine

## 2016-10-14 ENCOUNTER — Encounter: Payer: Self-pay | Admitting: Internal Medicine

## 2016-10-14 VITALS — BP 138/88 | HR 71 | Temp 98.7°F | Ht 65.25 in | Wt 136.4 lb

## 2016-10-14 DIAGNOSIS — I10 Essential (primary) hypertension: Secondary | ICD-10-CM

## 2016-10-14 DIAGNOSIS — K5100 Ulcerative (chronic) pancolitis without complications: Secondary | ICD-10-CM | POA: Diagnosis not present

## 2016-10-14 NOTE — Progress Notes (Signed)
Subjective:    Patient ID: Joseph Mejia, male    DOB: [birth year], 70 y.o.   MRN: [id number]  HPI  BP Readings from Last 3 Encounters:  10/14/16 138/88  09/23/16 110/78  01/10/16 80/75   70 year old patient has a long history of essential hypertension.  Previously hydrochlorothiazide had been discontinued and his blood pressure was controlled on lisinopril 10 mg daily.  At the time of his annual exam due to low normal blood pressure readings, this medication was discontinued.  The patient developed worsening headaches and elevated blood pressure readings.  Lisinopril has been resumed and blood pressure has improved.  Today he feels well  Past Medical History:  Diagnosis Date  . ADD 02/23/2008  . ANXIETY 08/03/2007  . ASTHMA 08/03/2007  . CHEST PAIN 11/17/2008  . Closed fracture of four ribs 06/30/2008  . DEPRESSION 08/03/2007  . ERECTILE DYSFUNCTION, ORGANIC 02/23/2008  . Failed moderate sedation during procedure 11/02/2008  . GERD 08/03/2007  . HAND PAIN, LEFT 06/20/2009  . HIP PAIN, RIGHT 05/27/2008  . HYPERLIPIDEMIA 08/03/2007  . HYPERTENSION 08/03/2007  . Irritable bowel syndrome 11/02/2008  . OSTEOARTHRITIS 11/02/2008  . OSTEOPENIA 07/26/2008  . RENAL DISEASE, CHRONIC, MILD 11/04/2008  . UNIVERSAL ULCERATIVE COLITIS 01/26/2009     Social History   Social History  . Marital status: Married    Spouse name: N/A  . Number of children: N/A  . Years of education: N/A   Occupational History  . Not on file.   Social History Main Topics  . Smoking status: Never Smoker  . Smokeless tobacco: Never Used  . Alcohol use 4.2 oz/week    7 Glasses of wine per week  . Drug use: No  . Sexual activity: Not on file   Other Topics Concern  . Not on file   Social History Narrative  . No narrative on file    Past Surgical History:  Procedure Laterality Date  . ANTERIOR FUSION CERVICAL SPINE  2012  . BASAL CELL CARCINOMA EXCISION Left    ear, head  . CARPAL TUNNEL RELEASE  2012  .  CERVICAL FUSION  May 2016   C7  . COLONOSCOPY W/ BIOPSIES  06/2001, 08/2006, 07/2008   colon polyp, ulcerative colitis, lymphoid aggregates  . ELBOW SURGERY  01/2002   left  . HAND SURGERY  02/2010   basal thumb joint removal and tendon transplant([facility])  . INCISION AND DRAINAGE PERIRECTAL ABSCESS  1998  . [place]  . SHOULDER SURGERY  09/2007   injury at work, dislocated  . UPPER GASTROINTESTINAL ENDOSCOPY  06/2001   2cm hiatal hernia    Family History  Problem Relation Age of Onset  . Heart disease Mother     s/p CABG age 49  . Cancer Father     colon , brain  . Colon cancer Father 70  . COPD Sister   . Heart disease Brother     CAD  . Seizures Brother 57    Allergies  Allergen Reactions  . Aspirin     REACTION: nose bleeds   . Mercaptopurine     REACTION: myalgias  . Procaine Hcl     REACTION: MAKES HEART RACE    Current Outpatient Prescriptions on File Prior to Visit  Medication Sig Dispense Refill  . ALPRAZolam (XANAX) 0.5 MG tablet Take 1 tablet (0.5 mg total) by mouth daily. 90 tablet 1  . amphetamine-dextroamphetamine (ADDERALL) 7.5 MG tablet Take 1 tablet by mouth daily. [place]  tablet 0  . amphetamine-dextroamphetamine (ADDERALL) 7.5 MG tablet Take 1 tablet by mouth daily. 30 tablet 0  . amphetamine-dextroamphetamine (ADDERALL) 7.5 MG tablet Take 1 tablet by mouth daily. 30 tablet 0  . Ascorbic Acid (VITAMIN C) 1000 MG tablet Take 1,000 mg by mouth daily.    Marland Kitchen atorvastatin (LIPITOR) 40 MG tablet TAKE 1 TABLET (40 MG TOTAL) BY MOUTH DAILY. 90 tablet 1  . beclomethasone (QVAR) 80 MCG/ACT inhaler Inhale 2 puffs into the lungs daily. 3 Inhaler 3  . buPROPion (WELLBUTRIN SR) 150 MG 12 hr tablet Take 1 tablet (150 mg total) by mouth every morning. 90 tablet 1  . Calcium Carbonate-Vitamin D (CALTRATE 600+D) 600-400 MG-UNIT per tablet Take 2 tablets by mouth daily.     . Cyanocobalamin (VITAMIN B 12 PO) Take 5,000 mcg by mouth daily.    . diphenhydrAMINE  (BENADRYL) 25 MG tablet Take 25 mg by mouth as needed.    Marland Kitchen escitalopram (LEXAPRO) 5 MG tablet Take 1 tablet (5 mg total) by mouth daily. 90 tablet 3  . fexofenadine (ALLEGRA) 180 MG tablet Take 180 mg by mouth daily as needed.    . hyoscyamine (LEVBID) 0.375 MG 12 hr tablet TAKE 1 TABLET(S) BY MOUTH EVERY 12 HOURS AS NEEDED FOR CRAMPING 90 tablet 1  . levocetirizine (XYZAL) 5 MG tablet Take 5 mg by mouth as needed for allergies.    Marland Kitchen lisinopril (PRINIVIL,ZESTRIL) 10 MG tablet TAKE 1 TABLET (10 MG TOTAL) BY MOUTH DAILY. 90 tablet 1  . Multiple Vitamin (MULTIVITAMIN) tablet Take 1 tablet by mouth daily.      Marland Kitchen omeprazole (PRILOSEC) 20 MG capsule TAKE 1 CAPSULE (20 MG TOTAL) BY MOUTH DAILY. 90 capsule 1  . Oxymetazoline HCl (EQ NASAL SPRAY NA) Place 1 spray into the nose as needed.    . predniSONE (DELTASONE) 1 MG tablet Take 2 tablets (2 mg total) by mouth daily. 180 tablet 1  . zolpidem (AMBIEN) 10 MG tablet Take 1 tablet (10 mg total) by mouth at bedtime. 90 tablet 1   No current facility-administered medications on file prior to visit.     BP 138/88 (BP Location: Left Arm, Patient Position: Sitting, Cuff Size: Normal)   Pulse 71   Temp 98.7 F (37.1 C) (Oral)   Ht 5' 5.25" (1.657 m)   Wt 136 lb 6.1 oz (61.9 kg)   SpO2 98%   BMI 22.52 kg/m      Review of Systems  Constitutional: Negative for appetite change, chills, fatigue and fever.  HENT: Negative for congestion, dental problem, ear pain, hearing loss, sore throat, tinnitus, trouble swallowing and voice change.   Eyes: Negative for pain, discharge and visual disturbance.  Respiratory: Negative for cough, chest tightness, wheezing and stridor.   Cardiovascular: Negative for chest pain, palpitations and leg swelling.  Gastrointestinal: Negative for abdominal distention, abdominal pain, blood in stool, constipation, diarrhea, nausea and vomiting.  Genitourinary: Negative for difficulty urinating, discharge, flank pain, genital  sores, hematuria and urgency.  Musculoskeletal: Negative for arthralgias, back pain, gait problem, joint swelling, myalgias and neck stiffness.  Skin: Negative for rash.  Neurological: Positive for headaches. Negative for dizziness, syncope, speech difficulty, weakness and numbness.  Hematological: Negative for adenopathy. Does not bruise/bleed easily.  Psychiatric/Behavioral: Negative for behavioral problems and dysphoric mood. The patient is not nervous/anxious.        Objective:   Physical Exam  Constitutional: He is oriented to person, place, and time. He appears well-developed.  Blood pressure 136/74  HENT:  Head: Normocephalic.  Right Ear: External ear normal.  Left Ear: External ear normal.  Eyes: Conjunctivae and EOM are normal.  Neck: Normal range of motion.  Cardiovascular: Normal rate and normal heart sounds.   Pulmonary/Chest: Breath sounds normal.  Abdominal: Bowel sounds are normal.  Musculoskeletal: Normal range of motion. He exhibits no edema or tenderness.  Neurological: He is alert and oriented to person, place, and time.  Psychiatric: He has a normal mood and affect. His behavior is normal.          Assessment & Plan:   Essential hypertension.  The patient failed a trial off lisinopril and has resumed taking 10 mg daily.  Will continue.  Blood pressure well controlled today  Follow-up as scheduled Continue home blood pressure monitoring  Justene Jensen Pilar Plate

## 2016-10-14 NOTE — Patient Instructions (Signed)
Limit your sodium (Salt) intake  Resume lisinopril 10 mg daily  Atorvastatin 40 mg- okay to place on hold for 4 weeks

## 2016-11-18 ENCOUNTER — Telehealth: Payer: Self-pay | Admitting: Internal Medicine

## 2016-11-18 DIAGNOSIS — R339 Retention of urine, unspecified: Secondary | ICD-10-CM

## 2016-11-18 NOTE — Telephone Encounter (Signed)
Patient went to the ER November 17, 2016. He needs a referral to an urologist.

## 2016-11-18 NOTE — Telephone Encounter (Signed)
Spoke to pt, told him order for referral to Urologist was done and someone will be contacting him for an appt. Pt verbalized understanding and stated has a catheter in and was told needs to be seen today or tomorrow. Told pt okay I will let our referral coordinator know. Pt verbalized understanding.   Neoma Laming notified pt needs to be seen today or tomorrow. Neoma Laming verbalized understanding and will send paperwork over right away.

## 2016-12-16 ENCOUNTER — Telehealth: Payer: Self-pay | Admitting: Internal Medicine

## 2016-12-16 NOTE — Telephone Encounter (Signed)
Okay to refill? 

## 2016-12-16 NOTE — Telephone Encounter (Signed)
Okay to refill? Last filled 06/27/16 #90 refills 1  Last OV 10/14/16

## 2016-12-16 NOTE — Telephone Encounter (Signed)
Pharmacy called for pt to request a refill of zolpidem (AMBIEN) 10 MG tablet

## 2016-12-17 ENCOUNTER — Other Ambulatory Visit: Payer: Self-pay | Admitting: Emergency Medicine

## 2016-12-17 MED ORDER — ALPRAZOLAM 0.5 MG PO TABS: 0.5000 mg | ORAL_TABLET | Freq: Every day | ORAL | 1 refills | Status: DC

## 2016-12-17 NOTE — Telephone Encounter (Signed)
Prescription phoned into pharmacy.

## 2016-12-19 ENCOUNTER — Other Ambulatory Visit: Payer: Self-pay | Admitting: Internal Medicine

## 2016-12-30 ENCOUNTER — Other Ambulatory Visit: Payer: Self-pay | Admitting: Internal Medicine

## 2017-01-13 ENCOUNTER — Other Ambulatory Visit: Payer: Self-pay | Admitting: Internal Medicine

## 2017-02-05 ENCOUNTER — Other Ambulatory Visit: Payer: Self-pay | Admitting: Internal Medicine

## 2017-02-05 MED ORDER — ATORVASTATIN CALCIUM 40 MG PO TABS: ORAL_TABLET | ORAL | 1 refills | Status: DC

## 2017-02-10 ENCOUNTER — Telehealth: Payer: Self-pay | Admitting: Internal Medicine

## 2017-02-10 MED ORDER — AMPHETAMINE-DEXTROAMPHETAMINE 7.5 MG PO TABS: 7.5000 mg | ORAL_TABLET | Freq: Every day | ORAL | 0 refills | Status: DC

## 2017-02-10 MED ORDER — AMPHETAMINE-DEXTROAMPHETAMINE 7.5 MG PO TABS
7.5000 mg | ORAL_TABLET | Freq: Every day | ORAL | 0 refills | Status: DC
Start: 2017-02-10 — End: 2017-04-14

## 2017-02-10 NOTE — Telephone Encounter (Signed)
Pt needs new rxs adderall for feb , march and april

## 2017-02-10 NOTE — Telephone Encounter (Signed)
Pt notified Rx ready for pickup. Rx printed and signed.  

## 2017-02-25 ENCOUNTER — Telehealth: Payer: Self-pay | Admitting: Internal Medicine

## 2017-02-25 ENCOUNTER — Other Ambulatory Visit: Payer: Self-pay | Admitting: Internal Medicine

## 2017-02-25 MED ORDER — OMEPRAZOLE 20 MG PO CPDR: DELAYED_RELEASE_CAPSULE | ORAL | 1 refills | Status: DC

## 2017-02-25 NOTE — Telephone Encounter (Signed)
Medication refilled

## 2017-02-25 NOTE — Telephone Encounter (Signed)
Pharmacy called for pt to request a refill of  omeprazole (PRILOSEC) 20 MG capsule  90 day   [place], [place] [address]

## 2017-03-20 ENCOUNTER — Other Ambulatory Visit: Payer: Self-pay | Admitting: Internal Medicine

## 2017-04-07 ENCOUNTER — Other Ambulatory Visit: Payer: Self-pay | Admitting: Internal Medicine

## 2017-04-14 ENCOUNTER — Encounter: Payer: Self-pay | Admitting: Family Medicine

## 2017-04-14 ENCOUNTER — Other Ambulatory Visit: Payer: Self-pay | Admitting: Internal Medicine

## 2017-04-14 ENCOUNTER — Ambulatory Visit (INDEPENDENT_AMBULATORY_CARE_PROVIDER_SITE_OTHER): Payer: Managed Care, Other (non HMO) | Admitting: Family Medicine

## 2017-04-14 ENCOUNTER — Other Ambulatory Visit: Payer: Self-pay

## 2017-04-14 VITALS — BP 110/82 | HR 70 | Temp 98.2°F | Wt 138.0 lb

## 2017-04-14 DIAGNOSIS — R6889 Other general symptoms and signs: Secondary | ICD-10-CM

## 2017-04-14 DIAGNOSIS — H578 Other specified disorders of eye and adnexa: Secondary | ICD-10-CM | POA: Diagnosis not present

## 2017-04-14 DIAGNOSIS — J3089 Other allergic rhinitis: Secondary | ICD-10-CM

## 2017-04-14 MED ORDER — AMPHETAMINE-DEXTROAMPHETAMINE 7.5 MG PO TABS: 7.5000 mg | ORAL_TABLET | Freq: Every day | ORAL | 0 refills | Status: DC

## 2017-04-14 MED ORDER — OLOPATADINE HCL 0.2 % OP SOLN: 1.0000 [drp] | Freq: Every day | OPHTHALMIC | 1 refills | Status: DC

## 2017-04-14 NOTE — Progress Notes (Signed)
Subjective:    Patient ID: Joseph Mejia, male    DOB: 11/18/1946, 71 y.o.   MRN: [id number]  HPI  Joseph Mejia is a 71 year old male who presents with itchy/watery eyes for 7 days.  Associated rhinitis with clear drainage and sneezing.  He denies fever, chills, sweats, N/V/D, nasal congestion, sinus pressure/pain, ear pain, or tooth pain. History of allergic rhinitis. Treatment with OTC claritin has not provided benefit. No aggravating or alleviating factors noted. History of allergic rhinitis noted  Review of Systems  Constitutional: Negative for chills, fatigue and fever.  HENT: Positive for rhinorrhea and sneezing. Negative for congestion, ear pain, postnasal drip, sinus pain, sinus pressure and sore throat.   Eyes: Positive for itching. Negative for pain and visual disturbance.  Respiratory: Negative for cough, shortness of breath and wheezing.   Cardiovascular: Negative for chest pain and palpitations.  Gastrointestinal: Negative for abdominal pain, constipation, diarrhea, nausea and vomiting.  Musculoskeletal: Negative for myalgias.  Neurological: Negative for dizziness, weakness, light-headedness, numbness and headaches.   Past Medical History:  Diagnosis Date  . ADD 02/23/2008  . ANXIETY 08/03/2007  . ASTHMA 08/03/2007  . CHEST PAIN 11/17/2008  . Closed fracture of four ribs 06/30/2008  . DEPRESSION 08/03/2007  . ERECTILE DYSFUNCTION, ORGANIC 02/23/2008  . Failed moderate sedation during procedure 11/02/2008  . GERD 08/03/2007  . HAND PAIN, LEFT 06/20/2009  . HIP PAIN, RIGHT 05/27/2008  . HYPERLIPIDEMIA 08/03/2007  . HYPERTENSION 08/03/2007  . Irritable bowel syndrome 11/02/2008  . OSTEOARTHRITIS 11/02/2008  . OSTEOPENIA 07/26/2008  . RENAL DISEASE, CHRONIC, MILD 11/04/2008  . UNIVERSAL ULCERATIVE COLITIS 01/26/2009     Social History   Social History  . Marital status: Married    Spouse name: N/A  . Number of children: N/A  . Years of education: N/A   Occupational History    . Not on file.   Social History Main Topics  . Smoking status: Never Smoker  . Smokeless tobacco: Never Used  . Alcohol use 4.2 oz/week    7 Glasses of wine per week  . Drug use: No  . Sexual activity: Not on file   Other Topics Concern  . Not on file   Social History Narrative  . No narrative on file    Past Surgical History:  Procedure Laterality Date  . ANTERIOR FUSION CERVICAL SPINE  2012  . BASAL CELL CARCINOMA EXCISION Left    ear, head  . CARPAL TUNNEL RELEASE  2012  . CERVICAL FUSION  May 2016   C7  . COLONOSCOPY W/ BIOPSIES  06/2001, 08/2006, 07/2008   colon polyp, ulcerative colitis, lymphoid aggregates  . ELBOW SURGERY  01/2002   left  . HAND SURGERY  02/2010   basal thumb joint removal and tendon transplant([facility])  . INCISION AND DRAINAGE PERIRECTAL ABSCESS  1998  . [place]  . SHOULDER SURGERY  09/2007   injury at work, dislocated  . UPPER GASTROINTESTINAL ENDOSCOPY  06/2001   2cm hiatal hernia    Family History  Problem Relation Age of Onset  . Heart disease Mother     s/p CABG age 30  . Cancer Father     colon , brain  . Colon cancer Father 41  . COPD Sister   . Heart disease Brother     CAD  . Seizures Brother 76    Allergies  Allergen Reactions  . Aspirin     REACTION: nose bleeds   . Mercaptopurine  REACTION: myalgias  . Procaine Hcl     REACTION: MAKES HEART RACE    Current Outpatient Prescriptions on File Prior to Visit  Medication Sig Dispense Refill  . ALPRAZolam (XANAX) 0.5 MG tablet Take 1 tablet (0.5 mg total) by mouth daily. 90 tablet 1  . amphetamine-dextroamphetamine (ADDERALL) 7.5 MG tablet Take 1 tablet by mouth daily. 30 tablet 0  . amphetamine-dextroamphetamine (ADDERALL) 7.5 MG tablet Take 1 tablet by mouth daily. 30 tablet 0  . amphetamine-dextroamphetamine (ADDERALL) 7.5 MG tablet Take 1 tablet by mouth daily. 30 tablet 0  . Ascorbic Acid (VITAMIN C) 1000 MG tablet Take 1,000 mg by mouth daily.     Marland Kitchen atorvastatin (LIPITOR) 40 MG tablet TAKE 1 TABLET (40 MG TOTAL) BY MOUTH DAILY. 90 tablet 1  . beclomethasone (QVAR) 80 MCG/ACT inhaler Inhale 2 puffs into the lungs daily. 3 Inhaler 3  . buPROPion (WELLBUTRIN SR) 150 MG 12 hr tablet TAKE ONE TABLET BY MOUTH EVERY MORNING 90 tablet 0  . Calcium Carbonate-Vitamin D (CALTRATE 600+D) 600-400 MG-UNIT per tablet Take 2 tablets by mouth daily.     . Cyanocobalamin (VITAMIN B 12 PO) Take 5,000 mcg by mouth daily.    . diphenhydrAMINE (BENADRYL) 25 MG tablet Take 25 mg by mouth as needed.    Marland Kitchen escitalopram (LEXAPRO) 5 MG tablet Take 1 tablet (5 mg total) by mouth daily. 90 tablet 3  . fexofenadine (ALLEGRA) 180 MG tablet Take 180 mg by mouth daily as needed.    . hyoscyamine (LEVBID) 0.375 MG 12 hr tablet TAKE 1 TABLET(S) BY MOUTH EVERY 12 HOURS AS NEEDED FOR CRAMPING 90 tablet 1  . levocetirizine (XYZAL) 5 MG tablet Take 5 mg by mouth as needed for allergies.    . Multiple Vitamin (MULTIVITAMIN) tablet Take 1 tablet by mouth daily.      Marland Kitchen omeprazole (PRILOSEC) 20 MG capsule TAKE 1 CAPSULE (20 MG TOTAL) BY MOUTH DAILY. 90 capsule 1  . Oxymetazoline HCl (EQ NASAL SPRAY NA) Place 1 spray into the nose as needed.    . zolpidem (AMBIEN) 10 MG tablet TAKE ONE TABLET BY MOUTH AT BEDTIME 90 tablet 0   No current facility-administered medications on file prior to visit.     BP 110/82 (BP Location: Left Arm, Patient Position: Sitting, Cuff Size: Normal)   Pulse 70   Temp 98.2 F (36.8 C) (Oral)   Wt 138 lb (62.6 kg)   BMI 22.79 kg/m       Objective:   Physical Exam  Constitutional: He is oriented to person, place, and time.  Thin, optimally nourished male  Eyes: Conjunctivae are normal. Pupils are equal, round, and reactive to light. Right conjunctiva is not injected. Left conjunctiva is not injected. No scleral icterus.  Watery eyes noted  Neck: Neck supple.  Cardiovascular: Normal rate and regular rhythm.   Pulmonary/Chest: Effort normal and  breath sounds normal. He has no wheezes. He has no rales.  Lymphadenopathy:    He has no cervical adenopathy.  Neurological: He is alert and oriented to person, place, and time.  Skin: Skin is warm and dry. No rash noted.       Assessment & Plan:  1. Itchy eyes Symptoms consistent with allergic rhinitis; exam is reassuring; treatment with olopatadine and follow up if symptoms do not improve with treatment, worsen, or he develops new symptoms. - Olopatadine HCl 0.2 % SOLN; Apply 1 drop to eye daily.  Dispense: 2.5 Bottle; Refill: 1  2. Allergic rhinitis  due to other allergic trigger, unspecified seasonality   Delano Metz, FNP-C

## 2017-04-14 NOTE — Patient Instructions (Signed)
It was a pleasure to see you today. Please follow up with your provider if symptoms do not improve with treatment worsen, or you develop new symptoms.  Allergic Rhinitis Allergic rhinitis is when the mucous membranes in the nose respond to allergens. Allergens are particles in the air that cause your body to have an allergic reaction. This causes you to release allergic antibodies. Through a chain of events, these eventually cause you to release histamine into the blood stream. Although meant to protect the body, it is this release of histamine that causes your discomfort, such as frequent sneezing, congestion, and an itchy, runny nose. What are the causes? Seasonal allergic rhinitis (hay fever) is caused by pollen allergens that may come from grasses, trees, and weeds. Year-round allergic rhinitis (perennial allergic rhinitis) is caused by allergens such as house dust mites, pet dander, and mold spores. What are the signs or symptoms?  Nasal stuffiness (congestion).  Itchy, runny nose with sneezing and tearing of the eyes. How is this diagnosed? Your health care provider can help you determine the allergen or allergens that trigger your symptoms. If you and your health care provider are unable to determine the allergen, skin or blood testing may be used. Your health care provider will diagnose your condition after taking your health history and performing a physical exam. Your health care provider may assess you for other related conditions, such as asthma, pink eye, or an ear infection. How is this treated? Allergic rhinitis does not have a cure, but it can be controlled by:  Medicines that block allergy symptoms. These may include allergy shots, nasal sprays, and oral antihistamines.  Avoiding the allergen. Hay fever may often be treated with antihistamines in pill or nasal spray forms. Antihistamines block the effects of histamine. There are over-the-counter medicines that may help with nasal  congestion and swelling around the eyes. Check with your health care provider before taking or giving this medicine. If avoiding the allergen or the medicine prescribed do not work, there are many new medicines your health care provider can prescribe. Stronger medicine may be used if initial measures are ineffective. Desensitizing injections can be used if medicine and avoidance does not work. Desensitization is when a patient is given ongoing shots until the body becomes less sensitive to the allergen. Make sure you follow up with your health care provider if problems continue. Follow these instructions at home: It is not possible to completely avoid allergens, but you can reduce your symptoms by taking steps to limit your exposure to them. It helps to know exactly what you are allergic to so that you can avoid your specific triggers. Contact a health care provider if:  You have a fever.  You develop a cough that does not stop easily (persistent).  You have shortness of breath.  You start wheezing.  Symptoms interfere with normal daily activities. This information is not intended to replace advice given to you by your health care provider. Make sure you discuss any questions you have with your health care provider. Document Released: 09/10/2001 Document Revised: 08/16/2016 Document Reviewed: 08/23/2013 Elsevier Interactive Patient Education  2017 [employer].

## 2017-06-20 ENCOUNTER — Other Ambulatory Visit: Payer: Self-pay | Admitting: Internal Medicine

## 2017-07-24 ENCOUNTER — Other Ambulatory Visit: Payer: Self-pay | Admitting: Internal Medicine

## 2017-08-07 ENCOUNTER — Other Ambulatory Visit: Payer: Self-pay | Admitting: Internal Medicine

## 2017-08-11 ENCOUNTER — Telehealth: Payer: Self-pay | Admitting: Internal Medicine

## 2017-08-11 MED ORDER — AMPHETAMINE-DEXTROAMPHETAMINE 7.5 MG PO TABS: 7.5000 mg | ORAL_TABLET | Freq: Every day | ORAL | 0 refills | Status: DC

## 2017-08-11 NOTE — Telephone Encounter (Signed)
Pt request refill  amphetamine-dextroamphetamine (ADDERALL) 7.5 MG tablet  3 mo supply  Wife has appt at 3pm this afternoon with Dr Raliegh Ip and hopes it will be ready then.

## 2017-08-11 NOTE — Telephone Encounter (Signed)
Wife at office and picked up his Rx.

## 2017-08-11 NOTE — Telephone Encounter (Signed)
Rx printed awaiting to be signed by MD. 

## 2017-08-14 ENCOUNTER — Other Ambulatory Visit: Payer: Self-pay | Admitting: Internal Medicine

## 2017-09-05 ENCOUNTER — Other Ambulatory Visit: Payer: Self-pay | Admitting: Internal Medicine

## 2017-09-09 ENCOUNTER — Encounter: Payer: Self-pay | Admitting: Internal Medicine

## 2017-09-09 ENCOUNTER — Ambulatory Visit (INDEPENDENT_AMBULATORY_CARE_PROVIDER_SITE_OTHER): Payer: Managed Care, Other (non HMO) | Admitting: Internal Medicine

## 2017-09-09 VITALS — BP 106/62 | HR 78 | Temp 98.1°F | Ht 65.0 in | Wt 143.8 lb

## 2017-09-09 DIAGNOSIS — R05 Cough: Secondary | ICD-10-CM | POA: Diagnosis not present

## 2017-09-09 DIAGNOSIS — J4520 Mild intermittent asthma, uncomplicated: Secondary | ICD-10-CM | POA: Diagnosis not present

## 2017-09-09 DIAGNOSIS — R059 Cough, unspecified: Secondary | ICD-10-CM

## 2017-09-09 DIAGNOSIS — J301 Allergic rhinitis due to pollen: Secondary | ICD-10-CM | POA: Diagnosis not present

## 2017-09-09 DIAGNOSIS — F900 Attention-deficit hyperactivity disorder, predominantly inattentive type: Secondary | ICD-10-CM

## 2017-09-09 DIAGNOSIS — J309 Allergic rhinitis, unspecified: Secondary | ICD-10-CM | POA: Insufficient documentation

## 2017-09-09 DIAGNOSIS — I10 Essential (primary) hypertension: Secondary | ICD-10-CM | POA: Diagnosis not present

## 2017-09-09 DIAGNOSIS — J45909 Unspecified asthma, uncomplicated: Secondary | ICD-10-CM | POA: Insufficient documentation

## 2017-09-09 MED ORDER — METHYLPREDNISOLONE ACETATE 80 MG/ML IJ SUSP
80.0000 mg | Freq: Once | INTRAMUSCULAR | Status: AC
  Administered 2017-09-09: 80 mg via INTRAMUSCULAR

## 2017-09-09 NOTE — Patient Instructions (Addendum)
Allergic Rhinitis Allergic rhinitis is when the mucous membranes in the nose respond to allergens. Allergens are particles in the air that cause your body to have an allergic reaction. This causes you to release allergic antibodies. Through a chain of events, these eventually cause you to release histamine into the blood stream. Although meant to protect the body, it is this release of histamine that causes your discomfort, such as frequent sneezing, congestion, and an itchy, runny nose. What are the causes? Seasonal allergic rhinitis (hay fever) is caused by pollen allergens that may come from grasses, trees, and weeds. Year-round allergic rhinitis (perennial allergic rhinitis) is caused by allergens such as house dust mites, pet dander, and mold spores. What are the signs or symptoms?  Nasal stuffiness (congestion).  Itchy, runny nose with sneezing and tearing of the eyes. How is this diagnosed? Your health care provider can help you determine the allergen or allergens that trigger your symptoms. If you and your health care provider are unable to determine the allergen, skin or blood testing may be used. Your health care provider will diagnose your condition after taking your health history and performing a physical exam. Your health care provider may assess you for other related conditions, such as asthma, pink eye, or an ear infection. How is this treated? Allergic rhinitis does not have a cure, but it can be controlled by:  Medicines that block allergy symptoms. These may include allergy shots, nasal sprays, and oral antihistamines.  Avoiding the allergen.  Hay fever may often be treated with antihistamines in pill or nasal spray forms. Antihistamines block the effects of histamine. There are over-the-counter medicines that may help with nasal congestion and swelling around the eyes. Check with your health care provider before taking or giving this medicine. If avoiding the allergen or the  medicine prescribed do not work, there are many new medicines your health care provider can prescribe. Stronger medicine may be used if initial measures are ineffective. Desensitizing injections can be used if medicine and avoidance does not work. Desensitization is when a patient is given ongoing shots until the body becomes less sensitive to the allergen. Make sure you follow up with your health care provider if problems continue. Follow these instructions at home: It is not possible to completely avoid allergens, but you can reduce your symptoms by taking steps to limit your exposure to them. It helps to know exactly what you are allergic to so that you can avoid your specific triggers. Contact a health care provider if:  You have a fever.  You develop a cough that does not stop easily (persistent).  You have shortness of breath.  You start wheezing.  Symptoms interfere with normal daily activities.   Call or return to clinic prn if these symptoms worsen or fail to improve as anticipated.

## 2017-09-09 NOTE — Progress Notes (Signed)
Subjective:    Patient ID: Joseph Mejia, male    DOB: 12-23-1946, 71 y.o.   MRN: [id number]  HPI  71 year old patient who has a history of allergic rhinitis and asthma.  The last 2 weeks she's had increasing cough, rhinorrhea, and more recently associated with some mild epistaxis.  He has been on oral antihistamines as well as inhalational steroids.  He has been using the saline nasal irrigation. He has a history of ADHD and has had a difficult time with the 7.5 milligram dose requiring cutting a 15 mg tablet. He is also had some intermittent wheezing in spite of maintenance Qvar.  Past Medical History:  Diagnosis Date  . ADD 02/23/2008  . ANXIETY 08/03/2007  . ASTHMA 08/03/2007  . CHEST PAIN 11/17/2008  . Closed fracture of four ribs 06/30/2008  . DEPRESSION 08/03/2007  . ERECTILE DYSFUNCTION, ORGANIC 02/23/2008  . Failed moderate sedation during procedure 11/02/2008  . GERD 08/03/2007  . HAND PAIN, LEFT 06/20/2009  . HIP PAIN, RIGHT 05/27/2008  . HYPERLIPIDEMIA 08/03/2007  . HYPERTENSION 08/03/2007  . Irritable bowel syndrome 11/02/2008  . OSTEOARTHRITIS 11/02/2008  . OSTEOPENIA 07/26/2008  . RENAL DISEASE, CHRONIC, MILD 11/04/2008  . UNIVERSAL ULCERATIVE COLITIS 01/26/2009     Social History   Social History  . Marital status: Married    Spouse name: N/A  . Number of children: N/A  . Years of education: N/A   Occupational History  . Not on file.   Social History Main Topics  . Smoking status: Never Smoker  . Smokeless tobacco: Never Used  . Alcohol use 4.2 oz/week    7 Glasses of wine per week  . Drug use: No  . Sexual activity: Not on file   Other Topics Concern  . Not on file   Social History Narrative  . No narrative on file    Past Surgical History:  Procedure Laterality Date  . ANTERIOR FUSION CERVICAL SPINE  2012  . BASAL CELL CARCINOMA EXCISION Left    ear, head  . CARPAL TUNNEL RELEASE  2012  . CERVICAL FUSION  May 2016   C7  . COLONOSCOPY W/ BIOPSIES   06/2001, 08/2006, 07/2008   colon polyp, ulcerative colitis, lymphoid aggregates  . ELBOW SURGERY  01/2002   left  . HAND SURGERY  02/2010   basal thumb joint removal and tendon transplant([facility])  . INCISION AND DRAINAGE PERIRECTAL ABSCESS  1998  . [place]  . SHOULDER SURGERY  09/2007   injury at work, dislocated  . UPPER GASTROINTESTINAL ENDOSCOPY  06/2001   2cm hiatal hernia    Family History  Problem Relation Age of Onset  . Heart disease Mother        s/p CABG age 38  . Cancer Father        colon , brain  . Colon cancer Father 72  . COPD Sister   . Heart disease Brother        CAD  . Seizures Brother 21    Allergies  Allergen Reactions  . Aspirin     REACTION: nose bleeds   . Mercaptopurine     REACTION: myalgias  . Procaine Hcl     REACTION: MAKES HEART RACE    Current Outpatient Prescriptions on File Prior to Visit  Medication Sig Dispense Refill  . ALPRAZolam (XANAX) 0.5 MG tablet TAKE ONE TABLET BY MOUTH DAILY 90 tablet 0  . amphetamine-dextroamphetamine (ADDERALL) 7.5 MG tablet Take 1 tablet by  mouth daily. 30 tablet 0  . amphetamine-dextroamphetamine (ADDERALL) 7.5 MG tablet Take 1 tablet by mouth daily. 30 tablet 0  . amphetamine-dextroamphetamine (ADDERALL) 7.5 MG tablet Take 1 tablet by mouth daily. 30 tablet 0  . Ascorbic Acid (VITAMIN C) 1000 MG tablet Take 1,000 mg by mouth daily.    Marland Kitchen atorvastatin (LIPITOR) 40 MG tablet TAKE ONE TABLET BY MOUTH DAILY 90 tablet 0  . buPROPion (WELLBUTRIN SR) 150 MG 12 hr tablet TAKE ONE TABLET BY MOUTH EVERY MORNING 90 tablet 0  . Calcium Carbonate-Vitamin D (CALTRATE 600+D) 600-400 MG-UNIT per tablet Take 2 tablets by mouth daily.     . Cyanocobalamin (VITAMIN B 12 PO) Take 5,000 mcg by mouth daily.    . diphenhydrAMINE (BENADRYL) 25 MG tablet Take 25 mg by mouth as needed.    Marland Kitchen escitalopram (LEXAPRO) 5 MG tablet Take 1 tablet (5 mg total) by mouth daily. 90 tablet 3  . fexofenadine (ALLEGRA) 180 MG  tablet Take 180 mg by mouth daily as needed.    . hyoscyamine (LEVBID) 0.375 MG 12 hr tablet TAKE ONE TABLET BY MOUTH EVERY 12 HOURS 180 tablet 0  . levocetirizine (XYZAL) 5 MG tablet Take 5 mg by mouth as needed for allergies.    Marland Kitchen lisinopril (PRINIVIL,ZESTRIL) 10 MG tablet TAKE ONE TABLET BY MOUTH DAILY 90 tablet 1  . Multiple Vitamin (MULTIVITAMIN) tablet Take 1 tablet by mouth daily.      . Olopatadine HCl 0.2 % SOLN Apply 1 drop to eye daily. 2.5 Bottle 1  . omeprazole (PRILOSEC) 20 MG capsule TAKE ONE CAPSULE BY MOUTH DAILY 90 capsule 0  . Oxymetazoline HCl (EQ NASAL SPRAY NA) Place 1 spray into the nose as needed.    Marland Kitchen QVAR 80 MCG/ACT inhaler INHALE 2 PUFFS INTO THE LUNGS TWICE DAILY. ( TO REPLACE PULMICORT ) 26.1 g 2  . zolpidem (AMBIEN) 10 MG tablet TAKE ONE TABLET BY MOUTH EVERY NIGHT AT BEDTIME 90 tablet 0   No current facility-administered medications on file prior to visit.     BP 106/62 (BP Location: Left Arm, Patient Position: Sitting, Cuff Size: Normal)   Pulse 78   Temp 98.1 F (36.7 C) (Oral)   Ht 5' 5"  (1.651 m)   Wt 143 lb 12.8 oz (65.2 kg)   SpO2 97%   BMI 23.93 kg/m     Review of Systems  Constitutional: Positive for activity change, appetite change and fatigue. Negative for chills and fever.  HENT: Positive for congestion, nosebleeds, postnasal drip, rhinorrhea and sneezing. Negative for dental problem, ear pain, hearing loss, sore throat, tinnitus, trouble swallowing and voice change.   Eyes: Negative for pain, discharge and visual disturbance.  Respiratory: Positive for shortness of breath and wheezing. Negative for cough, chest tightness and stridor.   Cardiovascular: Negative for chest pain, palpitations and leg swelling.  Gastrointestinal: Negative for abdominal distention, abdominal pain, blood in stool, constipation, diarrhea, nausea and vomiting.  Genitourinary: Negative for difficulty urinating, discharge, flank pain, genital sores, hematuria and  urgency.  Musculoskeletal: Negative for arthralgias, back pain, gait problem, joint swelling, myalgias and neck stiffness.  Skin: Negative for rash.  Neurological: Negative for dizziness, syncope, speech difficulty, weakness, numbness and headaches.  Hematological: Negative for adenopathy. Does not bruise/bleed easily.  Psychiatric/Behavioral: Negative for behavioral problems and dysphoric mood. The patient is not nervous/anxious.        Objective:   Physical Exam  Constitutional: He is oriented to person, place, and time. He appears well-developed.  Blood pressure low normal  HENT:  Head: Normocephalic.  Right Ear: External ear normal.  Left Ear: External ear normal.  Mouth/Throat: Oropharynx is clear and moist.  Nasal mucosal congestion and some rhinorrhea.  No obvious bleeding source noted  Eyes: Conjunctivae and EOM are normal.  Neck: Normal range of motion.  Cardiovascular: Normal rate and normal heart sounds.   Pulmonary/Chest: He has wheezes.  Scattered faint expiratory wheezing  Abdominal: Bowel sounds are normal.  Musculoskeletal: Normal range of motion. He exhibits no edema or tenderness.  Neurological: He is alert and oriented to person, place, and time.  Psychiatric: He has a normal mood and affect. His behavior is normal.          Assessment & Plan:   Flare allergic rhinitis Flare asthma  Will treat with Depo-Medrol 80 Continue oral and inhalational medications Force fluids  Nyoka Cowden

## 2017-09-18 ENCOUNTER — Other Ambulatory Visit: Payer: Self-pay | Admitting: Internal Medicine

## 2017-09-22 ENCOUNTER — Telehealth: Payer: Self-pay | Admitting: Internal Medicine

## 2017-09-22 NOTE — Telephone Encounter (Signed)
° ° ° °  Pt call to say that when he was in last week Dr Raliegh Ip told him that since the pharmacy has to cut the below med in half that it was ok for him to change to 20m of the below med. Pt call today to ask for a new RX .   Pt said his wife will bring the 2 that he has for 7.5 mg back on 09/23/17          amphetamine-dextroamphetamine (ADDERALL) 7.5 MG tablet

## 2017-09-23 ENCOUNTER — Other Ambulatory Visit: Payer: Self-pay | Admitting: Internal Medicine

## 2017-09-23 MED ORDER — AMPHETAMINE-DEXTROAMPHETAMINE 10 MG PO TABS: 10.0000 mg | ORAL_TABLET | Freq: Every day | ORAL | 0 refills | Status: DC

## 2017-09-23 NOTE — Telephone Encounter (Signed)
Wife was here this morning and she took the printed Rx with her.

## 2017-09-25 ENCOUNTER — Other Ambulatory Visit: Payer: Self-pay | Admitting: Internal Medicine

## 2017-09-29 ENCOUNTER — Ambulatory Visit (INDEPENDENT_AMBULATORY_CARE_PROVIDER_SITE_OTHER): Payer: Managed Care, Other (non HMO) | Admitting: Internal Medicine

## 2017-09-29 ENCOUNTER — Encounter: Payer: Self-pay | Admitting: Internal Medicine

## 2017-09-29 VITALS — BP 118/58 | HR 56 | Temp 98.0°F | Ht 65.0 in | Wt 146.2 lb

## 2017-09-29 DIAGNOSIS — Z87438 Personal history of other diseases of male genital organs: Secondary | ICD-10-CM | POA: Diagnosis not present

## 2017-09-29 DIAGNOSIS — Z0000 Encounter for general adult medical examination without abnormal findings: Secondary | ICD-10-CM

## 2017-09-29 DIAGNOSIS — E785 Hyperlipidemia, unspecified: Secondary | ICD-10-CM

## 2017-09-29 LAB — LIPID PANEL
Cholesterol: 177 mg/dL (ref 0–200)
HDL: 51.3 mg/dL (ref >39.00)
LDL Cholesterol: 109 mg/dL — ABNORMAL HIGH (ref 0–99)
NONHDL: 125.48
Total CHOL/HDL Ratio: 3
Triglycerides: 83 mg/dL (ref 0.0–149.0)
VLDL: 16.6 mg/dL (ref 0.0–40.0)

## 2017-09-29 LAB — CBC WITH DIFFERENTIAL/PLATELET
BASOS ABS: 0 10*3/uL (ref 0.0–0.1)
Basophils Relative: 0.4 % (ref 0.0–3.0)
EOS PCT: 3.2 % (ref 0.0–5.0)
Eosinophils Absolute: 0.2 10*3/uL (ref 0.0–0.7)
HCT: 40 % (ref 39.0–52.0)
HEMOGLOBIN: 13.2 g/dL (ref 13.0–17.0)
LYMPHS PCT: 26.1 % (ref 12.0–46.0)
Lymphs Abs: 2 10*3/uL (ref 0.7–4.0)
MCHC: 33.1 g/dL (ref 30.0–36.0)
MCV: 93.9 fl (ref 78.0–100.0)
MONOS PCT: 9.2 % (ref 3.0–12.0)
Monocytes Absolute: 0.7 10*3/uL (ref 0.1–1.0)
NEUTROS PCT: 61.1 % (ref 43.0–77.0)
Neutro Abs: 4.6 10*3/uL (ref 1.4–7.7)
Platelets: 172 10*3/uL (ref 150.0–400.0)
RBC: 4.26 Mil/uL (ref 4.22–5.81)
RDW: 13.6 % (ref 11.5–15.5)
WBC: 7.6 10*3/uL (ref 4.0–10.5)

## 2017-09-29 LAB — COMPREHENSIVE METABOLIC PANEL
ALBUMIN: 3.8 g/dL (ref 3.5–5.2)
ALK PHOS: 63 U/L (ref 39–117)
ALT: 33 U/L (ref 0–53)
AST: 25 U/L (ref 0–37)
BILIRUBIN TOTAL: 0.4 mg/dL (ref 0.2–1.2)
BUN: 37 mg/dL — ABNORMAL HIGH (ref 6–23)
CALCIUM: 9 mg/dL (ref 8.4–10.5)
CO2: 29 mEq/L (ref 19–32)
Chloride: 107 mEq/L (ref 96–112)
Creatinine, Ser: 1.44 mg/dL (ref 0.40–1.50)
GFR: 51.4 mL/min — AB (ref >60.00)
GLUCOSE: 101 mg/dL — AB (ref 70–99)
POTASSIUM: 5.2 meq/L — AB (ref 3.5–5.1)
Sodium: 143 mEq/L (ref 135–145)
TOTAL PROTEIN: 6.1 g/dL (ref 6.0–8.3)

## 2017-09-29 LAB — TSH: TSH: 1.31 u[IU]/mL (ref 0.35–4.50)

## 2017-09-29 LAB — TESTOSTERONE: Testosterone: 334.66 ng/dL (ref 300.00–890.00)

## 2017-09-29 NOTE — Patient Instructions (Signed)
Limit your sodium (Salt) intake  Please check your blood pressure on a regular basis.  If it is consistently greater than 150/90, please make an office appointment.    It is important that you exercise regularly, at least 20 minutes 3 to 4 times per week.  If you develop chest pain or shortness of breath seek  medical attention.  Take a calcium supplement, plus [phone] units of vitamin D  Return in 6 months for follow-up

## 2017-09-29 NOTE — Progress Notes (Signed)
Subjective:    Patient ID: Joseph Mejia, male    DOB: 09-12-1946, 71 y.o.   MRN: [id number]  HPI  HPI  70 year-old patient who is in today for a wellness exam.  He is followed by GI due to universal colitis.  He has had C-spine surgery in November of 2014 and as well as a right-sided carpal toe release in December of 2014. He has a history of anxiety and ADHD and has been followed by Dr. Cheryln Manly.  He has a history of asthma, which has been stable.  He uses rescue medication rarely His prednisone dose is down to 1 mg twice a day.  His last colonoscopy was 2016.  His colitis was in remission at that time He did have a follow-up bone density in the spring of 2016.   He has osteopenia but showed a 5% improvement in the femoral neck T score.  Lab Results  Component Value Date   TESTOSTERONE 251.61 (L) 02/23/2008    Past Medical History:  Diagnosis Date  . ADD 02/23/2008  . ANXIETY 08/03/2007  . ASTHMA 08/03/2007  . CHEST PAIN 11/17/2008  . Closed fracture of four ribs 06/30/2008  . DEPRESSION 08/03/2007  . ERECTILE DYSFUNCTION, ORGANIC 02/23/2008  . Failed moderate sedation during procedure 11/02/2008  . GERD 08/03/2007  . HAND PAIN, LEFT 06/20/2009  . HIP PAIN, RIGHT 05/27/2008  . HYPERLIPIDEMIA 08/03/2007  . HYPERTENSION 08/03/2007  . Irritable bowel syndrome 11/02/2008  . OSTEOARTHRITIS 11/02/2008  . OSTEOPENIA 07/26/2008  . RENAL DISEASE, CHRONIC, MILD 11/04/2008  . UNIVERSAL ULCERATIVE COLITIS 01/26/2009     Social History   Social History  . Marital status: Married    Spouse name: N/A  . Number of children: N/A  . Years of education: N/A   Occupational History  . Not on file.   Social History Main Topics  . Smoking status: Never Smoker  . Smokeless tobacco: Never Used  . Alcohol use 4.2 oz/week    7 Glasses of wine per week  . Drug use: No  . Sexual activity: Not on file   Other Topics Concern  . Not on file   Social History Narrative  . No narrative on file    Past  Surgical History:  Procedure Laterality Date  . ANTERIOR FUSION CERVICAL SPINE  2012  . BASAL CELL CARCINOMA EXCISION Left    ear, head  . CARPAL TUNNEL RELEASE  2012  . CERVICAL FUSION  May 2016   C7  . COLONOSCOPY W/ BIOPSIES  06/2001, 08/2006, 07/2008   colon polyp, ulcerative colitis, lymphoid aggregates  . ELBOW SURGERY  01/2002   left  . HAND SURGERY  02/2010   basal thumb joint removal and tendon transplant([facility])  . INCISION AND DRAINAGE PERIRECTAL ABSCESS  1998  . [place]  . SHOULDER SURGERY  09/2007   injury at work, dislocated  . UPPER GASTROINTESTINAL ENDOSCOPY  06/2001   2cm hiatal hernia    Family History  Problem Relation Age of Onset  . Heart disease Mother        s/p CABG age 42  . Cancer Father        colon , brain  . Colon cancer Father 101  . COPD Sister   . Heart disease Brother        CAD  . Seizures Brother 68    Allergies  Allergen Reactions  . Aspirin     REACTION: nose bleeds   .  Mercaptopurine     REACTION: myalgias  . Procaine Hcl     REACTION: MAKES HEART RACE    Current Outpatient Prescriptions on File Prior to Visit  Medication Sig Dispense Refill  . ALPRAZolam (XANAX) 0.5 MG tablet TAKE ONE TABLET BY MOUTH DAILY 90 tablet 0  . amphetamine-dextroamphetamine (ADDERALL) 10 MG tablet Take 1 tablet (10 mg total) by mouth daily with breakfast. 30 tablet 0  . amphetamine-dextroamphetamine (ADDERALL) 10 MG tablet Take 1 tablet (10 mg total) by mouth daily with breakfast. 30 tablet 0  . amphetamine-dextroamphetamine (ADDERALL) 10 MG tablet Take 1 tablet (10 mg total) by mouth daily with breakfast. 30 tablet 0  . Ascorbic Acid (VITAMIN C) 1000 MG tablet Take 1,000 mg by mouth daily.    Marland Kitchen atorvastatin (LIPITOR) 40 MG tablet TAKE ONE TABLET BY MOUTH DAILY 90 tablet 0  . buPROPion (WELLBUTRIN SR) 150 MG 12 hr tablet TAKE ONE TABLET BY MOUTH EVERY MORNING 90 tablet 0  . Cyanocobalamin (VITAMIN B 12 PO) Take 5,000 mcg by mouth daily.     . diphenhydrAMINE (BENADRYL) 25 MG tablet Take 25 mg by mouth as needed.    Marland Kitchen escitalopram (LEXAPRO) 5 MG tablet TAKE ONE TABLET BY MOUTH DAILY 90 tablet 2  . fexofenadine (ALLEGRA) 180 MG tablet Take 180 mg by mouth daily as needed.    . hyoscyamine (LEVBID) 0.375 MG 12 hr tablet TAKE ONE TABLET BY MOUTH EVERY 12 HOURS 180 tablet 0  . levocetirizine (XYZAL) 5 MG tablet Take 5 mg by mouth as needed for allergies.    Marland Kitchen lisinopril (PRINIVIL,ZESTRIL) 10 MG tablet TAKE ONE TABLET BY MOUTH DAILY 90 tablet 1  . Multiple Vitamin (MULTIVITAMIN) tablet Take 1 tablet by mouth daily.      . Olopatadine HCl 0.2 % SOLN Apply 1 drop to eye daily. 2.5 Bottle 1  . omeprazole (PRILOSEC) 20 MG capsule TAKE ONE CAPSULE BY MOUTH DAILY 90 capsule 0  . Oxymetazoline HCl (EQ NASAL SPRAY NA) Place 1 spray into the nose as needed.    Marland Kitchen QVAR 80 MCG/ACT inhaler INHALE 2 PUFFS INTO THE LUNGS TWICE DAILY. ( TO REPLACE PULMICORT ) 26.1 g 2  . zolpidem (AMBIEN) 10 MG tablet TAKE ONE TABLET BY MOUTH EVERY NIGHT AT BEDTIME 90 tablet 0   No current facility-administered medications on file prior to visit.     BP (!) 118/58 (BP Location: Left Arm, Patient Position: Sitting, Cuff Size: Normal)   Pulse (!) 56   Temp 98 F (36.7 C) (Oral)   Ht 5\' 5"  (1.651 m)   Wt 146 lb 3.2 oz (66.3 kg)   SpO2 97%   BMI 24.33 kg/m      Review of Systems  Constitutional: Negative for appetite change, chills, fatigue and fever.  HENT: Negative for congestion, dental problem, ear pain, hearing loss, sore throat, tinnitus, trouble swallowing and voice change.   Eyes: Negative for pain, discharge and visual disturbance.  Respiratory: Negative for cough, chest tightness, wheezing and stridor.   Cardiovascular: Negative for chest pain, palpitations and leg swelling.  Gastrointestinal: Negative for abdominal distention, abdominal pain, blood in stool, constipation, diarrhea, nausea and vomiting.  Genitourinary: Negative for difficulty  urinating, discharge, flank pain, genital sores, hematuria and urgency.  Musculoskeletal: Negative for arthralgias, back pain, gait problem, joint swelling, myalgias and neck stiffness.  Skin: Negative for rash.  Neurological: Negative for dizziness, syncope, speech difficulty, weakness, numbness and headaches.  Hematological: Negative for adenopathy. Does not bruise/bleed easily.  Psychiatric/Behavioral: Negative for behavioral problems and dysphoric mood. The patient is not nervous/anxious.        Objective:   Physical Exam  Constitutional: He appears well-developed and well-nourished.  HENT:  Head: Normocephalic and atraumatic.  Right Ear: External ear normal.  Left Ear: External ear normal.  Nose: Nose normal.  Mouth/Throat: Oropharynx is clear and moist.  Eyes: Pupils are equal, round, and reactive to light. Conjunctivae and EOM are normal. No scleral icterus.  Neck: Normal range of motion. Neck supple. No JVD present. No thyromegaly present.  Cardiovascular: Regular rhythm and intact distal pulses.  Exam reveals no gallop and no friction rub.   Murmur heard. Variable heart rate Grade 2/6 systolic murmur  Pulmonary/Chest: Effort normal and breath sounds normal. He exhibits no tenderness.  Abdominal: Soft. Bowel sounds are normal. He exhibits no distension and no mass. There is no tenderness.  Genitourinary: Prostate normal and penis normal.  Musculoskeletal: Normal range of motion. He exhibits no edema or tenderness.  Lymphadenopathy:    He has no cervical adenopathy.  Neurological: He is alert. He has normal reflexes. No cranial nerve deficit. Coordination normal.  Skin: Skin is warm and dry. No rash noted.  Psychiatric: He has a normal mood and affect. His behavior is normal.          Assessment & Plan:   Preventive health care Essential hypertension, well-controlled History of universal colitis.  Follow-up colonoscopy one year Dyslipidemia.  Review lipid profile.   Continue statin therapy ADHD. History of erectile dysfunction.  Will review a testosterone level  Follow-up 6 months  Desma Wilkowski Pilar Plate

## 2017-10-02 ENCOUNTER — Other Ambulatory Visit: Payer: Self-pay | Admitting: Internal Medicine

## 2017-10-03 ENCOUNTER — Telehealth: Payer: Self-pay | Admitting: Internal Medicine

## 2017-10-03 NOTE — Telephone Encounter (Signed)
Pt would like blood result and copy of health examination form to home address

## 2017-10-07 NOTE — Telephone Encounter (Signed)
Completed.

## 2017-10-09 ENCOUNTER — Other Ambulatory Visit: Payer: Self-pay | Admitting: Internal Medicine

## 2017-10-23 ENCOUNTER — Other Ambulatory Visit: Payer: Self-pay | Admitting: Internal Medicine

## 2017-11-07 ENCOUNTER — Other Ambulatory Visit: Payer: Self-pay | Admitting: Internal Medicine

## 2017-11-12 ENCOUNTER — Other Ambulatory Visit: Payer: Self-pay | Admitting: Internal Medicine

## 2017-12-29 ENCOUNTER — Other Ambulatory Visit: Payer: Self-pay | Admitting: Internal Medicine

## 2018-01-05 ENCOUNTER — Telehealth: Payer: Self-pay | Admitting: Family Medicine

## 2018-01-05 NOTE — Telephone Encounter (Signed)
Copied from [place]. Topic: Inquiry >> Jan 05, 2018 10:04 AM Pricilla Handler wrote: Reason for CRM: Patient called requesting medication refills for amphetamine-dextroamphetamine (ADDERALL) 10 MG tablet and Lisinopril (PRINIVIL,ZESTRIL) 10 MG tablet. Patient's preferred pharmacy Is [facility], [place] [address] [address] [phone] (Phone) [phone] (Fax)

## 2018-01-07 DIAGNOSIS — Z23 Encounter for immunization: Secondary | ICD-10-CM | POA: Diagnosis not present

## 2018-01-07 DIAGNOSIS — L570 Actinic keratosis: Secondary | ICD-10-CM | POA: Diagnosis not present

## 2018-01-07 DIAGNOSIS — D485 Neoplasm of uncertain behavior of skin: Secondary | ICD-10-CM | POA: Diagnosis not present

## 2018-01-07 NOTE — Telephone Encounter (Signed)
I advised patient that Dr. Burnice Logan is out of the office today and will be back tomorrow and that he will receive a call when the Rx are ready for pick up

## 2018-01-08 MED ORDER — LISINOPRIL 10 MG PO TABS: 10.0000 mg | ORAL_TABLET | Freq: Every day | ORAL | 0 refills | Status: DC

## 2018-01-08 NOTE — Telephone Encounter (Signed)
All medication refills are okay

## 2018-01-08 NOTE — Telephone Encounter (Signed)
Lisinopril filled to pharmacy as requested.   Please advise Adderall request.  Last OV: 09/29/17 Last filled: 09/23/17 Sig: Take 1 tablet (10 mg total) by mouth daily with breakfast. UDS: Not on file.

## 2018-01-09 ENCOUNTER — Other Ambulatory Visit: Payer: Self-pay | Admitting: Internal Medicine

## 2018-01-09 MED ORDER — AMPHETAMINE-DEXTROAMPHETAMINE 10 MG PO TABS: 10.0000 mg | ORAL_TABLET | Freq: Every day | ORAL | 0 refills | Status: DC

## 2018-01-09 MED ORDER — LISINOPRIL 10 MG PO TABS: 10.0000 mg | ORAL_TABLET | Freq: Every day | ORAL | 0 refills | Status: DC

## 2018-01-09 NOTE — Telephone Encounter (Signed)
rx printed awaiting for MD signature.

## 2018-01-12 ENCOUNTER — Telehealth: Payer: Self-pay | Admitting: Internal Medicine

## 2018-01-12 NOTE — Telephone Encounter (Signed)
Copied from [place] [phone]. Topic: Inquiry >> Jan 12, 2018 10:05 AM Corie Chiquito, NT wrote: Reason for CRM: Patient called and stated that he picks his Adderall medication up from the office and wanted to know if it was there or not. If so could someone give him a call back so he can come by and pick it up. He can be reach at [phone]

## 2018-01-13 DIAGNOSIS — M170 Bilateral primary osteoarthritis of knee: Secondary | ICD-10-CM | POA: Diagnosis not present

## 2018-01-13 DIAGNOSIS — M1712 Unilateral primary osteoarthritis, left knee: Secondary | ICD-10-CM | POA: Diagnosis not present

## 2018-01-13 DIAGNOSIS — M25562 Pain in left knee: Secondary | ICD-10-CM | POA: Diagnosis not present

## 2018-01-13 NOTE — Telephone Encounter (Signed)
Pt notified via voice message Rx ready for pickup. Rx printed and signed.

## 2018-01-13 NOTE — Telephone Encounter (Signed)
Left a voice message stating that Rx is ready to be picked up.

## 2018-02-02 ENCOUNTER — Other Ambulatory Visit: Payer: Self-pay | Admitting: Internal Medicine

## 2018-02-09 DIAGNOSIS — M7021 Olecranon bursitis, right elbow: Secondary | ICD-10-CM | POA: Diagnosis not present

## 2018-02-10 DIAGNOSIS — M1712 Unilateral primary osteoarthritis, left knee: Secondary | ICD-10-CM | POA: Diagnosis not present

## 2018-02-10 DIAGNOSIS — M170 Bilateral primary osteoarthritis of knee: Secondary | ICD-10-CM | POA: Diagnosis not present

## 2018-02-10 DIAGNOSIS — M1711 Unilateral primary osteoarthritis, right knee: Secondary | ICD-10-CM | POA: Diagnosis not present

## 2018-02-16 ENCOUNTER — Other Ambulatory Visit: Payer: Self-pay | Admitting: Internal Medicine

## 2018-02-17 DIAGNOSIS — M1712 Unilateral primary osteoarthritis, left knee: Secondary | ICD-10-CM | POA: Diagnosis not present

## 2018-02-24 DIAGNOSIS — M1712 Unilateral primary osteoarthritis, left knee: Secondary | ICD-10-CM | POA: Diagnosis not present

## 2018-03-02 DIAGNOSIS — R69 Illness, unspecified: Secondary | ICD-10-CM | POA: Diagnosis not present

## 2018-03-03 ENCOUNTER — Ambulatory Visit: Payer: Self-pay | Admitting: *Deleted

## 2018-03-03 NOTE — Telephone Encounter (Signed)
Pt asking if he can stop taking his lexapro. Stated that it is causing impotence, dizziness, weakness, dry mouth, anxiety, decreased sex drive. Will notify his pcp regarding him taking the lexapro.  Reason for Disposition . Caller has URGENT medication question about med that PCP prescribed and triager unable to answer question  Answer Assessment - Initial Assessment Questions 1. SYMPTOMS: "Do you have any symptoms?"     Weakness, dizziness, impotence, dry mouth, sluggishness, anxiety, decreased sex drive 2. SEVERITY: If symptoms are present, ask "Are they mild, moderate or severe?"     severe  Protocols used: MEDICATION QUESTION CALL-A-AH

## 2018-03-05 ENCOUNTER — Telehealth: Payer: Self-pay | Admitting: Internal Medicine

## 2018-03-05 ENCOUNTER — Ambulatory Visit: Payer: Managed Care, Other (non HMO) | Admitting: Family Medicine

## 2018-03-05 NOTE — Telephone Encounter (Signed)
Copied from [place] [phone]. Topic: General - Other >> Mar 05, 2018  8:39 AM Cecelia Byars, NT wrote: Reason for CRM: Patient called again and said he is still having side effects such as dizziness ,and problems sexually from the  escitalopram (LEXAPRO) 5 MG tablet he would like to know if he should stop taking the medication completely or should he do it gradually ,he says a detailed  message can be left if no answer, [phone]

## 2018-03-05 NOTE — Telephone Encounter (Signed)
Spoke with patient and he stated he and his wife looked up the side effects of Lexapro and they are pretty sure that the dizziness erectile dysfunction as well as the dry mouth is due to the medication. Patient would like to be off of the medication. I schedule an appointment for tomorrow with another provider since the medication has to be tapered off. No further action needed.

## 2018-03-06 ENCOUNTER — Ambulatory Visit (INDEPENDENT_AMBULATORY_CARE_PROVIDER_SITE_OTHER): Payer: Medicare HMO | Admitting: Family Medicine

## 2018-03-06 ENCOUNTER — Encounter: Payer: Self-pay | Admitting: Family Medicine

## 2018-03-06 VITALS — BP 124/78 | HR 58 | Temp 98.0°F | Wt 134.6 lb

## 2018-03-06 DIAGNOSIS — T50905A Adverse effect of unspecified drugs, medicaments and biological substances, initial encounter: Secondary | ICD-10-CM | POA: Diagnosis not present

## 2018-03-06 NOTE — Progress Notes (Signed)
   Subjective:    Patient ID: Joseph Mejia, male    DOB: 22-Sep-1946, 72 y.o.   MRN: [id number]  HPI Here with concerns about Lexapro. He was started on this last September, and he only takes 5 mg daily. However in the past month he has developed problems with a low libido and erectile problems. He researched this and found that these are common side effects. He wants to stop the medication and asks how to do this. He says his moods are fine.    Review of Systems  Constitutional: Negative.   Respiratory: Negative.   Cardiovascular: Negative.   Neurological: Negative.   Psychiatric/Behavioral: Negative.        Objective:   Physical Exam  Constitutional: He is oriented to person, place, and time. He appears well-developed and well-nourished.  Cardiovascular: Normal rate, regular rhythm, normal heart sounds and intact distal pulses.  Pulmonary/Chest: Effort normal and breath sounds normal. No respiratory distress. He has no wheezes. He has no rales.  Neurological: He is alert and oriented to person, place, and time.  Psychiatric: He has a normal mood and affect. His behavior is normal. Thought content normal.          Assessment & Plan:  He is having side effects of Lexapro and we agreed to stop it. He will take this once every other day for 10 days and then stop it. Recheck prn.  Alysia Penna, MD

## 2018-03-06 NOTE — Telephone Encounter (Signed)
Patient seen in office today. 

## 2018-03-18 ENCOUNTER — Telehealth: Payer: Self-pay | Admitting: Family Medicine

## 2018-03-18 NOTE — Telephone Encounter (Signed)
Copied from [place]. Topic: Inquiry >> Mar 18, 2018  7:47 AM Pricilla Handler wrote: Reason for CRM: Patient's wife called requesting if Dr. Raliegh Ip would change patient's prescription of amphetamine-dextroamphetamine (ADDERALL) 10 MG tablet to a 5 mg tablet. Patient's wife states that he is more hyper when taking the 59m tablet. Please call patient's wife at [phone]           Thank You!!!

## 2018-03-18 NOTE — Telephone Encounter (Signed)
Okay to decrease Adderall to a 5 mg tablet  Suggest return office visit to further assess

## 2018-03-18 NOTE — Telephone Encounter (Signed)
Noted! Routing to [facility]

## 2018-03-19 NOTE — Telephone Encounter (Signed)
Spoke to Joseph Mejia and informed him that his prescription was changed to 5mg  per request. Patient recently pick up 10mg  2 days ago. I informed patient to half the medication and call ahead of time for new order of prescription.

## 2018-03-28 ENCOUNTER — Other Ambulatory Visit: Payer: Self-pay | Admitting: Internal Medicine

## 2018-04-13 ENCOUNTER — Ambulatory Visit (INDEPENDENT_AMBULATORY_CARE_PROVIDER_SITE_OTHER): Payer: Medicare HMO | Admitting: Family Medicine

## 2018-04-13 ENCOUNTER — Encounter: Payer: Self-pay | Admitting: Family Medicine

## 2018-04-13 VITALS — BP 104/70 | HR 56 | Temp 97.8°F | Ht 65.0 in | Wt 139.7 lb

## 2018-04-13 DIAGNOSIS — J3089 Other allergic rhinitis: Secondary | ICD-10-CM

## 2018-04-13 MED ORDER — METHYLPREDNISOLONE 4 MG PO TBPK: ORAL_TABLET | ORAL | 0 refills | Status: DC

## 2018-04-13 NOTE — Progress Notes (Signed)
   Subjective:    Patient ID: Joseph Mejia, male    DOB: 1946/04/15, 72 y.o.   MRN: [id number]  HPI Here for 3 weeks of itchy eyes, running nose and sneezing. No cough. Using Allegra and Benadryl.    Review of Systems  Constitutional: Negative.   HENT: Positive for congestion, postnasal drip, rhinorrhea and sneezing. Negative for ear pain, sinus pressure, sinus pain and sore throat.   Eyes: Positive for itching.  Respiratory: Negative.        Objective:   Physical Exam  Constitutional: He appears well-developed and well-nourished.  HENT:  Right Ear: External ear normal.  Left Ear: External ear normal.  Nose: Nose normal.  Mouth/Throat: Oropharynx is clear and moist.  Eyes: Conjunctivae are normal.  Neck: Neck supple. No thyromegaly present.  Pulmonary/Chest: Effort normal and breath sounds normal. No respiratory distress. He has no wheezes. He has no rales.  Lymphadenopathy:    He has no cervical adenopathy.          Assessment & Plan:  Allergies. Try Xyzal daily. Given a Medrol dose pack.  Alysia Penna, MD

## 2018-04-17 ENCOUNTER — Telehealth: Payer: Self-pay | Admitting: Internal Medicine

## 2018-04-17 NOTE — Telephone Encounter (Signed)
Copied from [place] [phone]. Topic: Quick Communication - Rx Refill/Question >> Apr 17, 2018  9:26 AM Margot Ables wrote: Medication: hyoscyamine (LEVBID) 0.375 MG 12 hr tablet is not covered by the insurance - pt wife called insurance and they recommended Oscimin which she says is the same type of medication - pt has about 10 days to 2 weeks Pt also has a Qvar inhaler and insurance will be contacting Dr. Raliegh Ip to find out if it is medically necessary to "tier down" for pricing Has the patient contacted their pharmacy? No - medication change  Preferred Pharmacy (with phone number or street name): Oscimin will go to [place], [place] - [address] [phone] (Phone) [phone] (Fax)     Agent: Please be advised that RX refills may take up to 3 business days. We ask that you follow-up with your pharmacy.

## 2018-04-20 ENCOUNTER — Other Ambulatory Visit: Payer: Self-pay

## 2018-04-20 NOTE — Telephone Encounter (Signed)
OK for Oscimin SR  BID

## 2018-04-20 NOTE — Telephone Encounter (Signed)
Okay to fill for Oscimin? Please advise

## 2018-04-21 MED ORDER — HYOSCYAMINE SULFATE ER 0.375 MG PO TB12: 0.3750 mg | ORAL_TABLET | Freq: Two times a day (BID) | ORAL | 0 refills | Status: DC

## 2018-04-21 NOTE — Telephone Encounter (Signed)
Alternative Rx sent in. Patient notified!

## 2018-04-24 NOTE — Telephone Encounter (Signed)
Alternative, oscimin is to expensive. Suggest something else. Please advise

## 2018-04-27 ENCOUNTER — Other Ambulatory Visit: Payer: Self-pay

## 2018-04-27 ENCOUNTER — Telehealth: Payer: Self-pay | Admitting: Family Medicine

## 2018-04-27 NOTE — Telephone Encounter (Signed)
Patient would like the new Adderall refill for 44m. Please advise

## 2018-04-27 NOTE — Telephone Encounter (Signed)
Spoke to patient and he does not know of the medication alternatives and claims that his wife may knows since she is  over all his medication. Patient will have his wife return phone call with information.

## 2018-04-27 NOTE — Telephone Encounter (Signed)
Patient is calling and states the insurance will pay for OSCIMIN SR 0.375 MG 12 hr tablet 2 tablet a day.  [place], [place] [address]  [address] [place] [address]  Phone: [phone] Fax: [phone]

## 2018-04-27 NOTE — Telephone Encounter (Signed)
Copied from [place] [id number]. Topic: General - Other >> Apr 27, 2018  8:30 AM Yvette Rack wrote: Reason for CRM: patient wife calling stating that her husband would like to go from 10mg  to 5 mg on the ADDERALL it makes him to hyped they would like a call when RX is ready to be picked up  They also states that the insurance want pay for the hyoscyamine (OSCIMIN SR) 0.375 MG 12 hr tablet but insurance will pay for the RX OsciminSR 0.375mg 

## 2018-04-28 ENCOUNTER — Telehealth: Payer: Self-pay | Admitting: Internal Medicine

## 2018-04-28 NOTE — Telephone Encounter (Signed)
Copied from [place] [id number]. Topic: Quick Communication - See Telephone Encounter >> Apr 28, 2018  4:01 PM Aurelio Brash B wrote: CRM for notification. See Telephone encounter for: 04/28/18.  Bret from [employer] called to say hyoscyamine (OSCIMIN SR) 0.375 MG 12 hr tablet is not covered by insurance and asking if the pt can get another rx for a another medication  [place], [place] - [address] [phone] (Phone) [phone] (Fax)

## 2018-04-28 NOTE — Telephone Encounter (Signed)
Okay for refills.

## 2018-04-29 ENCOUNTER — Other Ambulatory Visit: Payer: Self-pay | Admitting: Orthopedic Surgery

## 2018-04-29 DIAGNOSIS — M71521 Other bursitis, not elsewhere classified, right elbow: Secondary | ICD-10-CM | POA: Diagnosis not present

## 2018-04-29 DIAGNOSIS — M86131 Other acute osteomyelitis, right radius and ulna: Secondary | ICD-10-CM | POA: Diagnosis not present

## 2018-04-29 DIAGNOSIS — M7021 Olecranon bursitis, right elbow: Secondary | ICD-10-CM | POA: Diagnosis not present

## 2018-04-29 NOTE — Telephone Encounter (Signed)
Spoke to patient and he was informed medication was not covered will have his wife return phone call with alternative.

## 2018-04-29 NOTE — Telephone Encounter (Signed)
Left message to return call 

## 2018-04-30 ENCOUNTER — Other Ambulatory Visit: Payer: Self-pay

## 2018-04-30 NOTE — Telephone Encounter (Signed)
Patient's wife called and states that insurance will cover Dicyclomine HLC Capsules- either 10mg  or 20mg . [place], [place] [address]  CB#: [phone]

## 2018-04-30 NOTE — Telephone Encounter (Signed)
Spoke to patient and he stated that his wife wasn't in but will give [place] a call back when they can.

## 2018-05-01 NOTE — Telephone Encounter (Signed)
What strength and quantity would you like for me to send in? Please advise

## 2018-05-04 ENCOUNTER — Other Ambulatory Visit: Payer: Self-pay

## 2018-05-04 MED ORDER — AMPHETAMINE-DEXTROAMPHETAMINE 5 MG PO TABS: 5.0000 mg | ORAL_TABLET | Freq: Every day | ORAL | 0 refills | Status: DC

## 2018-05-04 MED ORDER — DICYCLOMINE HCL 20 MG PO TABS: 20.0000 mg | ORAL_TABLET | Freq: Four times a day (QID) | ORAL | 0 refills | Status: DC

## 2018-05-04 NOTE — Telephone Encounter (Signed)
20 mg #60 one twice daily as needed

## 2018-05-04 NOTE — Telephone Encounter (Signed)
Patient notified medication sent in and Adderall is ready for pick-up.

## 2018-05-04 NOTE — Telephone Encounter (Signed)
Dicyclomine 20 mg #60 sent in electronically.

## 2018-05-05 ENCOUNTER — Other Ambulatory Visit: Payer: Self-pay | Admitting: Family Medicine

## 2018-05-05 NOTE — Telephone Encounter (Signed)
This is Dr Raliegh Ip patient.

## 2018-05-11 ENCOUNTER — Other Ambulatory Visit: Payer: Self-pay | Admitting: Internal Medicine

## 2018-05-12 DIAGNOSIS — R69 Illness, unspecified: Secondary | ICD-10-CM | POA: Diagnosis not present

## 2018-05-27 ENCOUNTER — Ambulatory Visit (INDEPENDENT_AMBULATORY_CARE_PROVIDER_SITE_OTHER): Payer: Medicare HMO | Admitting: Family Medicine

## 2018-05-27 ENCOUNTER — Ambulatory Visit: Payer: Self-pay | Admitting: *Deleted

## 2018-05-27 ENCOUNTER — Encounter: Payer: Self-pay | Admitting: Family Medicine

## 2018-05-27 VITALS — BP 120/64 | HR 84 | Temp 98.4°F | Wt 133.1 lb

## 2018-05-27 DIAGNOSIS — I10 Essential (primary) hypertension: Secondary | ICD-10-CM | POA: Diagnosis not present

## 2018-05-27 NOTE — Patient Instructions (Signed)
Stay well hydrated  Monitor blood pressure for now and if BP consistently > 140/90 start back the Lisinopril

## 2018-05-27 NOTE — Progress Notes (Signed)
Subjective:     Patient ID: Joseph Mejia, male   DOB: 08-15-1946, 72 y.o.   MRN: [id number]  HPI Patient is here for blood pressure check. He takes lisinopril 10 mg daily. This past Saturday he was at a car show and out in heat for several hours. He felt lightheaded and increased fatigue and general weakness. He took his blood pressure -had several low readings including 84/59, 79/52, and 82/75. He got into some air-conditioning and when he cooled down felt much better.  He took a lisinopril Sunday by mistake but has not taken any since then. Blood pressure earlier today 135/65. No headaches. No dizziness at this point. No chest pains.  Patient has lost some weight recently (about 10 pounds) was he attributes to recent mouth braces and restrictions with his diet  Past Medical History:  Diagnosis Date  . ADD 02/23/2008  . ANXIETY 08/03/2007  . ASTHMA 08/03/2007  . CHEST PAIN 11/17/2008  . Closed fracture of four ribs 06/30/2008  . DEPRESSION 08/03/2007  . ERECTILE DYSFUNCTION, ORGANIC 02/23/2008  . Failed moderate sedation during procedure 11/02/2008  . GERD 08/03/2007  . HAND PAIN, LEFT 06/20/2009  . HIP PAIN, RIGHT 05/27/2008  . HYPERLIPIDEMIA 08/03/2007  . HYPERTENSION 08/03/2007  . Irritable bowel syndrome 11/02/2008  . OSTEOARTHRITIS 11/02/2008  . OSTEOPENIA 07/26/2008  . RENAL DISEASE, CHRONIC, MILD 11/04/2008  . UNIVERSAL ULCERATIVE COLITIS 01/26/2009   Past Surgical History:  Procedure Laterality Date  . ANTERIOR FUSION CERVICAL SPINE  2012  . BASAL CELL CARCINOMA EXCISION Left    ear, head  . CARPAL TUNNEL RELEASE  2012  . CERVICAL FUSION  May 2016   C7  . COLONOSCOPY W/ BIOPSIES  06/2001, 08/2006, 07/2008   colon polyp, ulcerative colitis, lymphoid aggregates  . ELBOW SURGERY  01/2002   left  . HAND SURGERY  02/2010   basal thumb joint removal and tendon transplant([facility])  . INCISION AND DRAINAGE PERIRECTAL ABSCESS  1998  . [place]  . SHOULDER SURGERY  09/2007   injury at work, dislocated  . UPPER GASTROINTESTINAL ENDOSCOPY  06/2001   2cm hiatal hernia    reports that he has never smoked. He has never used smokeless tobacco. He reports that he drinks about 4.2 oz of alcohol per week. He reports that he does not use drugs. family history includes COPD in his sister; Cancer in his father; Colon cancer (age of onset: 23) in his father; Heart disease in his brother and mother; Seizures (age of onset: 1) in his brother. Allergies  Allergen Reactions  . Aspirin     REACTION: nose bleeds   . Mercaptopurine     REACTION: myalgias  . Procaine Hcl     REACTION: MAKES HEART RACE     Review of Systems  Respiratory: Negative for shortness of breath.   Cardiovascular: Negative for chest pain.  Gastrointestinal: Negative for abdominal pain, diarrhea, nausea and vomiting.  Neurological: Negative for dizziness (Not currently), syncope, weakness and headaches.       Objective:   Physical Exam  Constitutional: He appears well-developed and well-nourished.  HENT:  Mouth/Throat: Oropharynx is clear and moist.  Neck: Neck supple.  Cardiovascular: Normal rate and regular rhythm.  Pulmonary/Chest: Effort normal and breath sounds normal. He has no rales.  Lymphadenopathy:    He has no cervical adenopathy.       Assessment:     Recent low blood pressure and possible mild heat exhaustion this past Saturday. Blood  pressure currently stable off lisinopril.  Asymptomatic at this time.    Plan:     -Stressed importance of adequate hydration and early recognition of heat exhaustion symptoms and measures to reduce risk -Monitor blood pressure and continue to hold lisinopril unless blood pressures consistently up over 140/90  [name] MD [facility]

## 2018-05-27 NOTE — Telephone Encounter (Signed)
Pt seen today

## 2018-05-27 NOTE — Telephone Encounter (Signed)
   BP low when taking medication. pt stopped BP meds on Saturday   Pt is concerned about his BP over the weekend. Saturday his BP was 84/53 and 79/52, Sunday\'s was 82/75, 101/55 (felt faint and dizzy on Sunday). Monday 102/54. Tuesday 107/58, 127/70, 108/69. Today 135/65 at 0715; He hasn\'t taken BP medication since Saturday. He has also lost 10 pounds and is wondering if that could effect his BP medication. Recommendations made per nurse triage to include seeing a physician within 3 days; pt normally sees Dr Kwiatkowski but he has no availability within the patient\'s time constraints; pt offered and accepted appointment with Dr Bruce Burchette, [facility] 05/27/18 at 1630; he verbalizes understanding; will note to office for notification of this upcoming appointment. Reason for Disposition . [1] Fall in systolic BP > 20 mm Hg from normal AND [2] NOT dizzy, lightheaded, or weak  Answer Assessment - Initial Assessment Questions 1. BLOOD PRESSURE: "What is the blood pressure?" "Did you take at least two measurements 5 minutes apart?"     135/65; has not taken any BP medication since Saturday (not sure if he took medication on Sunday)   2. ONSET: "When did you take your blood pressure?"     Started taking it on Saturday 3. HOW: "How did you obtain the blood pressure?" (e.g., visiting nurse, automatic home BP monitor)     Home cuff on left arm 4. HISTORY: "Do you have a history of low blood pressure?" "What is your blood pressure normally?"     no 5. MEDICATIONS: "Are you taking any medications for blood pressure?" If yes: "Have they been changed recently?"     yes 6. PULSE RATE: "Do you know what your pulse rate is?"      57  at 0715 05/27/17 7. OTHER SYMPTOMS: "Have you been sick recently?" "Have you had a recent injury?"     Felt faint on Saturday; light headed when he bends over and stands back up; lost 10 pounds over a 2 month period (pt states he just got braces); he also takes adderall 8.  PREGNANCY: "Is there any chance you are pregnant?" "When was your last menstrual period?"     n/a  Protocols used: LOW BLOOD PRESSURE-A-AH

## 2018-06-08 ENCOUNTER — Telehealth: Payer: Self-pay | Admitting: Internal Medicine

## 2018-06-08 NOTE — Telephone Encounter (Signed)
Copied from [place] [phone]. Topic: Quick Communication - See Telephone Encounter >> Jun 08, 2018  9:51 AM Conception Chancy, NT wrote: CRM for notification. See Telephone encounter for: 06/08/18.  Patient wife is calling and states insurance no longer covers beclomethasone (QVAR) 80 MCG/ACT inhaler but they do cover Flovent inhaler. Is requesting this be changed. Please advise.   [place], [place] [address] [place] [address] Phone: [phone] Fax: [phone]

## 2018-06-09 ENCOUNTER — Encounter: Payer: Self-pay | Admitting: Internal Medicine

## 2018-06-09 ENCOUNTER — Ambulatory Visit: Payer: Self-pay

## 2018-06-09 ENCOUNTER — Ambulatory Visit (INDEPENDENT_AMBULATORY_CARE_PROVIDER_SITE_OTHER): Payer: Medicare HMO | Admitting: Internal Medicine

## 2018-06-09 VITALS — BP 132/80 | HR 81 | Temp 98.3°F | Wt 136.2 lb

## 2018-06-09 DIAGNOSIS — M150 Primary generalized (osteo)arthritis: Secondary | ICD-10-CM

## 2018-06-09 DIAGNOSIS — M8949 Other hypertrophic osteoarthropathy, multiple sites: Secondary | ICD-10-CM

## 2018-06-09 DIAGNOSIS — I10 Essential (primary) hypertension: Secondary | ICD-10-CM | POA: Diagnosis not present

## 2018-06-09 DIAGNOSIS — M159 Polyosteoarthritis, unspecified: Secondary | ICD-10-CM

## 2018-06-09 DIAGNOSIS — J4520 Mild intermittent asthma, uncomplicated: Secondary | ICD-10-CM | POA: Diagnosis not present

## 2018-06-09 MED ORDER — FLUTICASONE PROPIONATE HFA 110 MCG/ACT IN AERO: 1.0000 | INHALATION_SPRAY | Freq: Two times a day (BID) | RESPIRATORY_TRACT | 12 refills | Status: DC

## 2018-06-09 NOTE — Telephone Encounter (Signed)
Okay for switch to [employer]

## 2018-06-09 NOTE — Telephone Encounter (Signed)
Okay to change to Flovent? Please advise dose and sig if so. Thanks!

## 2018-06-09 NOTE — Progress Notes (Signed)
Subjective:    Patient ID: Joseph Mejia, male    DOB: July 01, 1946, 72 y.o.   MRN: [id number]  HPI  72 year old patient who is seen today for follow-up of essential hypertension.  He was seen last month and noted to be hypotensive in the setting of suspected volume depletion.  Lisinopril was held.  He continues to monitor blood pressure readings with occasional elevated readings.  He also complains of left knee pain for the past few days.  He has seen orthopedics in the past and has had injection therapy for this knee.  He does have a history of osteoarthritis.  He has a history of chronic asthma.  Due to insurance concerns he is unable to get Qvar refilled and oral fluticasone will be substituted.  He requests a letter on his behalf for insurance coverage  Past Medical History:  Diagnosis Date  . ADD 02/23/2008  . ANXIETY 08/03/2007  . ASTHMA 08/03/2007  . CHEST PAIN 11/17/2008  . Closed fracture of four ribs 06/30/2008  . DEPRESSION 08/03/2007  . ERECTILE DYSFUNCTION, ORGANIC 02/23/2008  . Failed moderate sedation during procedure 11/02/2008  . GERD 08/03/2007  . HAND PAIN, LEFT 06/20/2009  . HIP PAIN, RIGHT 05/27/2008  . HYPERLIPIDEMIA 08/03/2007  . HYPERTENSION 08/03/2007  . Irritable bowel syndrome 11/02/2008  . OSTEOARTHRITIS 11/02/2008  . OSTEOPENIA 07/26/2008  . RENAL DISEASE, CHRONIC, MILD 11/04/2008  . UNIVERSAL ULCERATIVE COLITIS 01/26/2009     Social History   Socioeconomic History  . Marital status: Married    Spouse name: Not on file  . Number of children: Not on file  . Years of education: Not on file  . Highest education level: Not on file  Occupational History  . Not on file  Social Needs  . Financial resource strain: Not on file  . Food insecurity:    Worry: Not on file    Inability: Not on file  . Transportation needs:    Medical: Not on file    Non-medical: Not on file  Tobacco Use  . Smoking status: Never Smoker  . Smokeless tobacco: Never Used  Substance and  Sexual Activity  . Alcohol use: Yes    Alcohol/week: 4.2 oz    Types: 7 Glasses of wine per week  . Drug use: No  . Sexual activity: Not on file  Lifestyle  . Physical activity:    Days per week: Not on file    Minutes per session: Not on file  . Stress: Not on file  Relationships  . Social connections:    Talks on phone: Not on file    Gets together: Not on file    Attends religious service: Not on file    Active member of club or organization: Not on file    Attends meetings of clubs or organizations: Not on file    Relationship status: Not on file  . Intimate partner violence:    Fear of current or ex partner: Not on file    Emotionally abused: Not on file    Physically abused: Not on file    Forced sexual activity: Not on file  Other Topics Concern  . Not on file  Social History Narrative  . Not on file    Past Surgical History:  Procedure Laterality Date  . ANTERIOR FUSION CERVICAL SPINE  2012  . BASAL CELL CARCINOMA EXCISION Left    ear, head  . CARPAL TUNNEL RELEASE  2012  . CERVICAL FUSION  May 2016  C7  . COLONOSCOPY W/ BIOPSIES  06/2001, 08/2006, 07/2008   colon polyp, ulcerative colitis, lymphoid aggregates  . ELBOW SURGERY  01/2002   left  . HAND SURGERY  02/2010   basal thumb joint removal and tendon transplant([facility])  . INCISION AND DRAINAGE PERIRECTAL ABSCESS  1998  . [place]  . SHOULDER SURGERY  09/2007   injury at work, dislocated  . UPPER GASTROINTESTINAL ENDOSCOPY  06/2001   2cm hiatal hernia    Family History  Problem Relation Age of Onset  . Heart disease Mother        s/p CABG age 33  . Cancer Father        colon , brain  . Colon cancer Father 3  . COPD Sister   . Heart disease Brother        CAD  . Seizures Brother 33    Allergies  Allergen Reactions  . Aspirin     REACTION: nose bleeds   . Mercaptopurine     REACTION: myalgias  . Procaine Hcl     REACTION: MAKES HEART RACE    Current Outpatient  Medications on File Prior to Visit  Medication Sig Dispense Refill  . ALPRAZolam (XANAX) 0.5 MG tablet TAKE ONE TABLET BY MOUTH DAILY 90 tablet 0  . amphetamine-dextroamphetamine (ADDERALL) 5 MG tablet Take 1 tablet (5 mg total) by mouth daily before breakfast. 30 tablet 0  . [START ON 07/05/2018] amphetamine-dextroamphetamine (ADDERALL) 5 MG tablet Take 1 tablet (5 mg total) by mouth daily before breakfast. 30 tablet 0  . amphetamine-dextroamphetamine (ADDERALL) 5 MG tablet Take 1 tablet (5 mg total) by mouth daily before breakfast. 30 tablet 0  . Ascorbic Acid (VITAMIN C) 1000 MG tablet Take 1,000 mg by mouth daily.    Marland Kitchen atorvastatin (LIPITOR) 40 MG tablet TAKE ONE TABLET BY MOUTH DAILY 90 tablet 3  . buPROPion (WELLBUTRIN SR) 150 MG 12 hr tablet TAKE ONE TABLET BY MOUTH EVERY MORNING 90 tablet 0  . Cyanocobalamin (VITAMIN B 12 PO) Take 5,000 mcg by mouth daily.    Marland Kitchen dicyclomine (BENTYL) 20 MG tablet Take 1 tablet (20 mg total) by mouth every 6 (six) hours. 60 tablet 0  . diphenhydrAMINE (BENADRYL) 25 MG tablet Take 25 mg by mouth as needed.    . fexofenadine (ALLEGRA) 180 MG tablet Take 180 mg by mouth daily as needed.    Marland Kitchen levocetirizine (XYZAL) 5 MG tablet Take 5 mg by mouth as needed for allergies.    Marland Kitchen lisinopril (PRINIVIL,ZESTRIL) 10 MG tablet Take 1 tablet (10 mg total) by mouth daily. (Patient not taking: Reported on 05/27/2018) 90 tablet 0  . Multiple Vitamin (MULTIVITAMIN) tablet Take 1 tablet by mouth daily.      . Olopatadine HCl 0.2 % SOLN Apply 1 drop to eye daily. 2.5 Bottle 1  . omeprazole (PRILOSEC) 20 MG capsule TAKE ONE CAPSULE BY MOUTH DAILY 90 capsule 0  . Oxymetazoline HCl (EQ NASAL SPRAY NA) Place 1 spray into the nose as needed.    Marland Kitchen PROAIR HFA 108 (90 Base) MCG/ACT inhaler INHALE 2 PUFFS INTO THE LUNGS 4 TIMES DAILY 25.5 each 2  . zolpidem (AMBIEN) 10 MG tablet TAKE ONE TABLET BY MOUTH EVERY NIGHT AT BEDTIME 90 tablet 0   No current facility-administered medications on  file prior to visit.     BP 132/80 (BP Location: Left Arm, Patient Position: Sitting, Cuff Size: Small)   Pulse 81   Temp 98.3 F (36.8  C) (Oral)   Wt 136 lb 3.2 oz (61.8 kg)   SpO2 93%   BMI 22.66 kg/m     Review of Systems  Constitutional: Positive for activity change. Negative for appetite change, chills, fatigue and fever.  HENT: Negative for congestion, dental problem, ear pain, hearing loss, sore throat, tinnitus, trouble swallowing and voice change.   Eyes: Negative for pain, discharge and visual disturbance.  Respiratory: Positive for wheezing. Negative for cough, chest tightness and stridor.   Cardiovascular: Negative for chest pain, palpitations and leg swelling.  Gastrointestinal: Negative for abdominal distention, abdominal pain, blood in stool, constipation, diarrhea, nausea and vomiting.  Genitourinary: Negative for difficulty urinating, discharge, flank pain, genital sores, hematuria and urgency.  Musculoskeletal: Positive for arthralgias and gait problem. Negative for back pain, joint swelling, myalgias and neck stiffness.  Skin: Negative for rash.  Neurological: Negative for dizziness, syncope, speech difficulty, weakness, numbness and headaches.  Hematological: Negative for adenopathy. Does not bruise/bleed easily.  Psychiatric/Behavioral: Negative for behavioral problems and dysphoric mood. The patient is not nervous/anxious.        Objective:   Physical Exam  Constitutional: He is oriented to person, place, and time. He appears well-developed.  Repeat blood pressure 130/65  HENT:  Head: Normocephalic.  Right Ear: External ear normal.  Left Ear: External ear normal.  Eyes: Conjunctivae and EOM are normal.  Neck: Normal range of motion.  Cardiovascular: Normal rate and normal heart sounds.  Pulmonary/Chest: Breath sounds normal. He has no wheezes.  Abdominal: Bowel sounds are normal.  Musculoskeletal: Normal range of motion. He exhibits no edema or  tenderness.  Left knee appears normal without effusion or excessive warmth  Neurological: He is alert and oriented to person, place, and time.  Psychiatric: He has a normal mood and affect. His behavior is normal.          Assessment & Plan:  Essential hypertension.  Blood pressure is a bit labile but presently normotensive.  We will continue to hold lisinopril at this time but if blood pressure readings are consistently greater than 140/80 will resume lisinopril Left knee pain.  Will moderate activities continue Tylenol.  Increase symptoms for 3 days after some overuse activities.  Orthopedic follow-up if any worsening Asthma.  Fluticasone substituted for Qvar due to insurance coverage.  Letter dictated on his behalf to assist with coverage  Marletta Lor

## 2018-06-09 NOTE — Telephone Encounter (Signed)
Pt.'s wife reports pt. Was taken off BP medication 2 weeks ago because his "BP had been running low." BP has been slowly coming back up. Last night it was 182/82. This morning it is 168/90. Appointment made for today.  Reason for Disposition . Systolic BP  >= 621 OR Diastolic >= 308  Answer Assessment - Initial Assessment Questions 1. BLOOD PRESSURE: "What is the blood pressure?" "Did you take at least two measurements 5 minutes apart?"     168/90 2. ONSET: "When did you take your blood pressure?"     This morning 3. HOW: "How did you obtain the blood pressure?" (e.g., visiting nurse, automatic home BP monitor)     Home monitor 4. HISTORY: "Do you have a history of high blood pressure?"     Yes  5. MEDICATIONS: "Are you taking any medications for blood pressure?" "Have you missed any doses recently?"     Meds were stopped 6. OTHER SYMPTOMS: "Do you have any symptoms?" (e.g., headache, chest pain, blurred vision, difficulty breathing, weakness)     No 7. PREGNANCY: "Is there any chance you are pregnant?" "When was your last menstrual period?"     N/a  Protocols used: HIGH BLOOD PRESSURE-A-AH

## 2018-06-09 NOTE — Patient Instructions (Signed)
Please check your blood pressure on a regular basis.  If it is consistently greater than 150/90,  resume lisinopril  Keep well-hydrated  Avoid overuse activities in hot outdoor ambient temperatures

## 2018-06-09 NOTE — Telephone Encounter (Signed)
Please advise dose and sig. Thanks!

## 2018-06-22 ENCOUNTER — Other Ambulatory Visit: Payer: Self-pay | Admitting: Internal Medicine

## 2018-06-22 ENCOUNTER — Telehealth: Payer: Self-pay | Admitting: Internal Medicine

## 2018-06-22 NOTE — Telephone Encounter (Signed)
Medication filled on 06/22/18.

## 2018-06-22 NOTE — Telephone Encounter (Signed)
Copied from [place] [phone]. Topic: Quick Communication - Rx Refill/Question >> Jun 22, 2018 11:10 AM Oliver Pila B wrote: Medication: zolpidem (AMBIEN) 10 MG tablet [[id number]]   Pt called b/c the pt was notified that the medication above was denied pcp; pt is calling for the reason, contact pt to advise

## 2018-07-07 ENCOUNTER — Ambulatory Visit (INDEPENDENT_AMBULATORY_CARE_PROVIDER_SITE_OTHER): Payer: Medicare HMO | Admitting: Internal Medicine

## 2018-07-07 ENCOUNTER — Encounter: Payer: Self-pay | Admitting: Internal Medicine

## 2018-07-07 VITALS — BP 140/80 | HR 63 | Temp 98.2°F | Wt 138.0 lb

## 2018-07-07 DIAGNOSIS — J4520 Mild intermittent asthma, uncomplicated: Secondary | ICD-10-CM

## 2018-07-07 NOTE — Patient Instructions (Signed)
Please check with your insurance for coverage of asthma medications  Return as scheduled for follow-up

## 2018-07-07 NOTE — Progress Notes (Signed)
Subjective:    Patient ID: Joseph Mejia, male    DOB: August 26, 1946, 72 y.o.   MRN: [id number]  HPI  72 year old patient who has a history of chronic asthma.  He has formally been treated with Qvar but this is not a covered medication.  He has also been prescribed fluticasone but apparently this medication also is not well covered.  He has been having some increase in asthma and frequent rescue albuterol use. He has had some difficulties since flying from [place] on a recent vacation.  He also describes some discomfort in the right ear following air travel  Past Medical History:  Diagnosis Date  . ADD 02/23/2008  . ANXIETY 08/03/2007  . ASTHMA 08/03/2007  . CHEST PAIN 11/17/2008  . Closed fracture of four ribs 06/30/2008  . DEPRESSION 08/03/2007  . ERECTILE DYSFUNCTION, ORGANIC 02/23/2008  . Failed moderate sedation during procedure 11/02/2008  . GERD 08/03/2007  . HAND PAIN, LEFT 06/20/2009  . HIP PAIN, RIGHT 05/27/2008  . HYPERLIPIDEMIA 08/03/2007  . HYPERTENSION 08/03/2007  . Irritable bowel syndrome 11/02/2008  . OSTEOARTHRITIS 11/02/2008  . OSTEOPENIA 07/26/2008  . RENAL DISEASE, CHRONIC, MILD 11/04/2008  . UNIVERSAL ULCERATIVE COLITIS 01/26/2009     Social History   Socioeconomic History  . Marital status: Married    Spouse name: Not on file  . Number of children: Not on file  . Years of education: Not on file  . Highest education level: Not on file  Occupational History  . Not on file  Social Needs  . Financial resource strain: Not on file  . Food insecurity:    Worry: Not on file    Inability: Not on file  . Transportation needs:    Medical: Not on file    Non-medical: Not on file  Tobacco Use  . Smoking status: Never Smoker  . Smokeless tobacco: Never Used  Substance and Sexual Activity  . Alcohol use: Yes    Alcohol/week: 4.2 oz    Types: 7 Glasses of wine per week  . Drug use: No  . Sexual activity: Not on file  Lifestyle  . Physical activity:    Days per week: Not  on file    Minutes per session: Not on file  . Stress: Not on file  Relationships  . Social connections:    Talks on phone: Not on file    Gets together: Not on file    Attends religious service: Not on file    Active member of club or organization: Not on file    Attends meetings of clubs or organizations: Not on file    Relationship status: Not on file  . Intimate partner violence:    Fear of current or ex partner: Not on file    Emotionally abused: Not on file    Physically abused: Not on file    Forced sexual activity: Not on file  Other Topics Concern  . Not on file  Social History Narrative  . Not on file    Past Surgical History:  Procedure Laterality Date  . ANTERIOR FUSION CERVICAL SPINE  2012  . BASAL CELL CARCINOMA EXCISION Left    ear, head  . CARPAL TUNNEL RELEASE  2012  . CERVICAL FUSION  May 2016   C7  . COLONOSCOPY W/ BIOPSIES  06/2001, 08/2006, 07/2008   colon polyp, ulcerative colitis, lymphoid aggregates  . ELBOW SURGERY  01/2002   left  . HAND SURGERY  02/2010   basal thumb joint  removal and tendon transplant([facility])  . INCISION AND DRAINAGE PERIRECTAL ABSCESS  1998  . [place]  . SHOULDER SURGERY  09/2007   injury at work, dislocated  . UPPER GASTROINTESTINAL ENDOSCOPY  06/2001   2cm hiatal hernia    Family History  Problem Relation Age of Onset  . Heart disease Mother        s/p CABG age 23  . Cancer Father        colon , brain  . Colon cancer Father 24  . COPD Sister   . Heart disease Brother        CAD  . Seizures Brother 28    Allergies  Allergen Reactions  . Procaine Palpitations  . Other Other (See Comments)    SKIN TEARS  . Promethazine Other (See Comments)  . Aspirin     REACTION: nose bleeds   . Mercaptopurine     REACTION: myalgias  . Procaine Hcl     REACTION: MAKES HEART RACE    Current Outpatient Medications on File Prior to Visit  Medication Sig Dispense Refill  . ALPRAZolam (XANAX) 0.5 MG tablet  TAKE ONE TABLET BY MOUTH DAILY 90 tablet 0  . amphetamine-dextroamphetamine (ADDERALL) 5 MG tablet Take 1 tablet (5 mg total) by mouth daily before breakfast. 30 tablet 0  . Ascorbic Acid (VITAMIN C) 1000 MG tablet Take 1,000 mg by mouth daily.    Marland Kitchen atorvastatin (LIPITOR) 40 MG tablet TAKE ONE TABLET BY MOUTH DAILY 90 tablet 3  . buPROPion (WELLBUTRIN SR) 150 MG 12 hr tablet TAKE 1 TABLET BY MOUTH EVERY MORNING. 90 tablet 0  . Cyanocobalamin (VITAMIN B 12 PO) Take 5,000 mcg by mouth daily.    Marland Kitchen dicyclomine (BENTYL) 20 MG tablet Take 1 tablet (20 mg total) by mouth every 6 (six) hours. 60 tablet 0  . diphenhydrAMINE (BENADRYL) 25 MG tablet Take 25 mg by mouth as needed.    . fexofenadine (ALLEGRA) 180 MG tablet Take 180 mg by mouth daily as needed.    . fluticasone (FLOVENT HFA) 110 MCG/ACT inhaler Inhale 1 puff into the lungs 2 (two) times daily. 1 Inhaler 12  . levocetirizine (XYZAL) 5 MG tablet Take 5 mg by mouth as needed for allergies.    . Multiple Vitamin (MULTIVITAMIN) tablet Take 1 tablet by mouth daily.      . Olopatadine HCl 0.2 % SOLN Apply 1 drop to eye daily. 2.5 Bottle 1  . omeprazole (PRILOSEC) 20 MG capsule TAKE ONE CAPSULE BY MOUTH DAILY 90 capsule 0  . Oxymetazoline HCl (EQ NASAL SPRAY NA) Place 1 spray into the nose as needed.    Marland Kitchen PROAIR HFA 108 (90 Base) MCG/ACT inhaler INHALE 2 PUFFS INTO THE LUNGS 4 TIMES DAILY 25.5 each 2  . zolpidem (AMBIEN) 10 MG tablet TAKE ONE TABLET BY MOUTH EVERY NIGHT AT BEDTIME 90 tablet 0  . amphetamine-dextroamphetamine (ADDERALL) 5 MG tablet Take 1 tablet (5 mg total) by mouth daily before breakfast. 30 tablet 0  . amphetamine-dextroamphetamine (ADDERALL) 5 MG tablet Take 1 tablet (5 mg total) by mouth daily before breakfast. 30 tablet 0  . lisinopril (PRINIVIL,ZESTRIL) 10 MG tablet Take 1 tablet (10 mg total) by mouth daily. (Patient not taking: Reported on 07/07/2018) 90 tablet 0   No current facility-administered medications on file prior to  visit.     BP 140/80 (BP Location: Right Arm, Patient Position: Sitting, Cuff Size: Normal)   Pulse 63   Temp 98.2 F (  36.8 C) (Oral)   Wt 138 lb (62.6 kg)   SpO2 98%   BMI 22.96 kg/m     Review of Systems  Constitutional: Negative for appetite change, chills, fatigue and fever.  HENT: Positive for sinus pressure. Negative for congestion, dental problem, ear pain, hearing loss, sore throat, tinnitus, trouble swallowing and voice change.   Eyes: Negative for pain, discharge and visual disturbance.  Respiratory: Positive for cough and wheezing. Negative for chest tightness and stridor.   Cardiovascular: Negative for chest pain, palpitations and leg swelling.  Gastrointestinal: Negative for abdominal distention, abdominal pain, blood in stool, constipation, diarrhea, nausea and vomiting.  Genitourinary: Negative for difficulty urinating, discharge, flank pain, genital sores, hematuria and urgency.  Musculoskeletal: Negative for arthralgias, back pain, gait problem, joint swelling, myalgias and neck stiffness.  Skin: Negative for rash.  Neurological: Negative for dizziness, syncope, speech difficulty, weakness, numbness and headaches.  Hematological: Negative for adenopathy. Does not bruise/bleed easily.  Psychiatric/Behavioral: Negative for behavioral problems and dysphoric mood. The patient is not nervous/anxious.        Objective:   Physical Exam  Constitutional: He is oriented to person, place, and time. He appears well-developed. No distress.  HENT:  Head: Normocephalic.  Right Ear: External ear normal.  Left Ear: External ear normal.  There is some wax in both canals but more prominent on the left.  The right tympanic membrane was visualized and appeared normal  Eyes: Conjunctivae and EOM are normal.  Neck: Normal range of motion.  Cardiovascular: Normal rate and normal heart sounds.  Pulmonary/Chest: Breath sounds normal. No respiratory distress.  No active wheezing    Abdominal: Bowel sounds are normal.  Musculoskeletal: Normal range of motion. He exhibits no edema or tenderness.  Neurological: He is alert and oriented to person, place, and time.  Psychiatric: He has a normal mood and affect. His behavior is normal.          Assessment & Plan:   Chronic asthma.  Patient has been asked to check with his insurance plan about covered medications for asthma.  He will contact the office with covered medications.  The patient was given samples of Caprice Renshaw, which was the only office sample available.  He is aware that this is a " third line" asthma drug that may be helpful until his formulary issues can be worked out Sinus congestion Essential hypertension stable  Marletta Lor

## 2018-07-10 ENCOUNTER — Other Ambulatory Visit: Payer: Self-pay | Admitting: Internal Medicine

## 2018-07-13 ENCOUNTER — Other Ambulatory Visit: Payer: Self-pay | Admitting: Internal Medicine

## 2018-08-13 ENCOUNTER — Other Ambulatory Visit: Payer: Self-pay | Admitting: Internal Medicine

## 2018-08-18 ENCOUNTER — Other Ambulatory Visit: Payer: Self-pay | Admitting: Internal Medicine

## 2018-08-18 MED ORDER — AMPHETAMINE-DEXTROAMPHETAMINE 5 MG PO TABS: 5.0000 mg | ORAL_TABLET | Freq: Every day | ORAL | 0 refills | Status: DC

## 2018-08-18 NOTE — Telephone Encounter (Signed)
Copied from [place] [id number]. Topic: Quick Communication - Rx Refill/Question >> Aug 18, 2018  9:10 AM Keene Breath wrote: Medication: amphetamine-dextroamphetamine (ADDERALL) 5 MG tablet   Patient called to request a refill for the above medication.  CB# [phone]  Preferred Pharmacy (with phone number or street name): [facility] [place], [place] - [address] [phone] (Phone) [phone] (Fax)

## 2018-08-18 NOTE — Telephone Encounter (Signed)
Okay for refill? Please advise

## 2018-08-18 NOTE — Telephone Encounter (Signed)
Rx refill request: Adderall 5 mg      Last filled: 05/04/18 ( gets # Rx)  LOV: 07/07/18  PCP: [place]: verifed

## 2018-08-19 ENCOUNTER — Other Ambulatory Visit: Payer: Self-pay | Admitting: Internal Medicine

## 2018-08-19 MED ORDER — AMPHETAMINE-DEXTROAMPHETAMINE 5 MG PO TABS: 5.0000 mg | ORAL_TABLET | Freq: Every day | ORAL | 0 refills | Status: DC

## 2018-08-24 DIAGNOSIS — R238 Other skin changes: Secondary | ICD-10-CM | POA: Diagnosis not present

## 2018-08-24 DIAGNOSIS — Z85828 Personal history of other malignant neoplasm of skin: Secondary | ICD-10-CM | POA: Diagnosis not present

## 2018-08-24 DIAGNOSIS — L570 Actinic keratosis: Secondary | ICD-10-CM | POA: Diagnosis not present

## 2018-08-24 DIAGNOSIS — D225 Melanocytic nevi of trunk: Secondary | ICD-10-CM | POA: Diagnosis not present

## 2018-09-02 ENCOUNTER — Telehealth: Payer: Self-pay | Admitting: *Deleted

## 2018-09-02 NOTE — Telephone Encounter (Signed)
Prior auth for Amphetamine-dextro 5mg  sent to [email or web address].

## 2018-09-08 ENCOUNTER — Encounter: Payer: Self-pay | Admitting: Internal Medicine

## 2018-09-08 ENCOUNTER — Ambulatory Visit (INDEPENDENT_AMBULATORY_CARE_PROVIDER_SITE_OTHER): Payer: Medicare HMO | Admitting: Internal Medicine

## 2018-09-08 VITALS — BP 100/80 | HR 52 | Temp 97.6°F | Ht 65.0 in | Wt 134.4 lb

## 2018-09-08 DIAGNOSIS — K5100 Ulcerative (chronic) pancolitis without complications: Secondary | ICD-10-CM

## 2018-09-08 DIAGNOSIS — Z0000 Encounter for general adult medical examination without abnormal findings: Secondary | ICD-10-CM

## 2018-09-08 DIAGNOSIS — J4520 Mild intermittent asthma, uncomplicated: Secondary | ICD-10-CM | POA: Diagnosis not present

## 2018-09-08 DIAGNOSIS — I10 Essential (primary) hypertension: Secondary | ICD-10-CM | POA: Diagnosis not present

## 2018-09-08 DIAGNOSIS — M5136 Other intervertebral disc degeneration, lumbar region: Secondary | ICD-10-CM

## 2018-09-08 MED ORDER — ALPRAZOLAM 0.5 MG PO TABS: 0.5000 mg | ORAL_TABLET | Freq: Every day | ORAL | 0 refills | Status: DC

## 2018-09-08 MED ORDER — OMEPRAZOLE 20 MG PO CPDR: 20.0000 mg | DELAYED_RELEASE_CAPSULE | Freq: Every day | ORAL | 2 refills | Status: DC

## 2018-09-08 MED ORDER — LISINOPRIL 10 MG PO TABS: 10.0000 mg | ORAL_TABLET | Freq: Every day | ORAL | 4 refills | Status: DC

## 2018-09-08 MED ORDER — BUPROPION HCL ER (SR) 150 MG PO TB12: 150.0000 mg | ORAL_TABLET | Freq: Every morning | ORAL | 4 refills | Status: DC

## 2018-09-08 MED ORDER — ATORVASTATIN CALCIUM 40 MG PO TABS: 40.0000 mg | ORAL_TABLET | Freq: Every day | ORAL | 3 refills | Status: DC

## 2018-09-08 MED ORDER — ZOLPIDEM TARTRATE 10 MG PO TABS: 10.0000 mg | ORAL_TABLET | Freq: Every day | ORAL | 0 refills | Status: DC

## 2018-09-08 NOTE — Patient Instructions (Addendum)
Limit your sodium (Salt) intake  Please check your blood pressure on a regular basis.  If it is consistently greater than 150/90, please make an office appointment.  Return in 6 months for follow-up    It is important that you exercise regularly, at least 20 minutes 3 to 4 times per week.  If you develop chest pain or shortness of breath seek  medical attention.  Minimize your use of Ambien.  Take for sleep as needed only

## 2018-09-08 NOTE — Progress Notes (Signed)
Subjective:    Patient ID: Joseph Mejia, male    DOB: Nov 07, 1946, 72 y.o.   MRN: [id number]  HPI  72 year old patient who is seen today for a annual preventive care examination as well as a subsequent Medicare wellness visit He has a history of universal colitis and is followed by GI.  His last colonoscopy was 2016 that revealed no inflammatory changes.  His GI status has been stable He has a history of ADHD.  He presently is off Adderall and continues to do well without medication.  He has been followed by Dr. Cheryln Manly in the past He has a history of asthma which has been stable.  He did have a follow-up bone density in the spring of 2016.  He has osteopenia but showed a 5% improvement in the femoral neck T score.  Family history.  Father died at 66.  Apparently colon cancer metastatic to the brain.  Mother died of complications of coronary artery disease.  Status post CABG.  One brother died of complications of an MI history of seizure disorder deceased at 55 one brother has a history of seizure disorder and traumatic brain injury.  One sister with COPD   Past Medical History:  Diagnosis Date  . ADD 02/23/2008  . ANXIETY 08/03/2007  . ASTHMA 08/03/2007  . CHEST PAIN 11/17/2008  . Closed fracture of four ribs 06/30/2008  . DEPRESSION 08/03/2007  . ERECTILE DYSFUNCTION, ORGANIC 02/23/2008  . Failed moderate sedation during procedure 11/02/2008  . GERD 08/03/2007  . HAND PAIN, LEFT 06/20/2009  . HIP PAIN, RIGHT 05/27/2008  . HYPERLIPIDEMIA 08/03/2007  . HYPERTENSION 08/03/2007  . Irritable bowel syndrome 11/02/2008  . OSTEOARTHRITIS 11/02/2008  . OSTEOPENIA 07/26/2008  . RENAL DISEASE, CHRONIC, MILD 11/04/2008  . UNIVERSAL ULCERATIVE COLITIS 01/26/2009     Social History   Socioeconomic History  . Marital status: Married    Spouse name: Not on file  . Number of children: Not on file  . Years of education: Not on file  . Highest education level: Not on file  Occupational History  . Not  on file  Social Needs  . Financial resource strain: Not on file  . Food insecurity:    Worry: Not on file    Inability: Not on file  . Transportation needs:    Medical: Not on file    Non-medical: Not on file  Tobacco Use  . Smoking status: Never Smoker  . Smokeless tobacco: Never Used  Substance and Sexual Activity  . Alcohol use: Yes    Alcohol/week: 7.0 standard drinks    Types: 7 Glasses of wine per week  . Drug use: No  . Sexual activity: Not on file  Lifestyle  . Physical activity:    Days per week: Not on file    Minutes per session: Not on file  . Stress: Not on file  Relationships  . Social connections:    Talks on phone: Not on file    Gets together: Not on file    Attends religious service: Not on file    Active member of club or organization: Not on file    Attends meetings of clubs or organizations: Not on file    Relationship status: Not on file  . Intimate partner violence:    Fear of current or ex partner: Not on file    Emotionally abused: Not on file    Physically abused: Not on file    Forced sexual activity: Not on file  Other Topics Concern  . Not on file  Social History Narrative  . Not on file    Past Surgical History:  Procedure Laterality Date  . ANTERIOR FUSION CERVICAL SPINE  2012  . BASAL CELL CARCINOMA EXCISION Left    ear, head  . CARPAL TUNNEL RELEASE  2012  . CERVICAL FUSION  May 2016   C7  . COLONOSCOPY W/ BIOPSIES  06/2001, 08/2006, 07/2008   colon polyp, ulcerative colitis, lymphoid aggregates  . ELBOW SURGERY  01/2002   left  . HAND SURGERY  02/2010   basal thumb joint removal and tendon transplant([facility])  . INCISION AND DRAINAGE PERIRECTAL ABSCESS  1998  . [place]  . SHOULDER SURGERY  09/2007   injury at work, dislocated  . UPPER GASTROINTESTINAL ENDOSCOPY  06/2001   2cm hiatal hernia    Family History  Problem Relation Age of Onset  . Heart disease Mother        s/p CABG age 64  . Cancer Father         colon , brain  . Colon cancer Father 63  . COPD Sister   . Heart disease Brother        CAD  . Seizures Brother 54    Allergies  Allergen Reactions  . Procaine Palpitations  . Other Other (See Comments)    SKIN TEARS  . Promethazine Other (See Comments)  . Aspirin     REACTION: nose bleeds   . Mercaptopurine     REACTION: myalgias  . Procaine Hcl     REACTION: MAKES HEART RACE    Current Outpatient Medications on File Prior to Visit  Medication Sig Dispense Refill  . amphetamine-dextroamphetamine (ADDERALL) 5 MG tablet Take 1 tablet (5 mg total) by mouth daily before breakfast. 30 tablet 0  . amphetamine-dextroamphetamine (ADDERALL) 5 MG tablet Take 1 tablet (5 mg total) by mouth daily before breakfast. 30 tablet 0  . amphetamine-dextroamphetamine (ADDERALL) 5 MG tablet Take 1 tablet (5 mg total) by mouth daily before breakfast. 30 tablet 0  . Cyanocobalamin (VITAMIN B 12 PO) Take 5,000 mcg by mouth daily.    Marland Kitchen dicyclomine (BENTYL) 20 MG tablet TAKE ONE TABLET BY MOUTH EVERY 6 HOURS 60 tablet 0  . diphenhydrAMINE (BENADRYL) 25 MG tablet Take 25 mg by mouth as needed.    . fexofenadine (ALLEGRA) 180 MG tablet Take 180 mg by mouth daily as needed.    . fluticasone (FLOVENT HFA) 110 MCG/ACT inhaler Inhale 1 puff into the lungs 2 (two) times daily. 1 Inhaler 12  . levocetirizine (XYZAL) 5 MG tablet Take 5 mg by mouth as needed for allergies.    . Multiple Vitamin (MULTIVITAMIN) tablet Take 1 tablet by mouth daily.      . Oxymetazoline HCl (EQ NASAL SPRAY NA) Place 1 spray into the nose as needed.    Marland Kitchen PROAIR HFA 108 (90 Base) MCG/ACT inhaler INHALE 2 PUFFS INTO THE LUNGS 4 TIMES DAILY 25.5 each 2  . Ascorbic Acid (VITAMIN C) 1000 MG tablet Take 1,000 mg by mouth daily.     No current facility-administered medications on file prior to visit.     BP 100/80 (BP Location: Right Arm, Patient Position: Sitting, Cuff Size: Normal)   Pulse (!) 52   Temp 97.6 F (36.4 C) (Oral)    Ht 5\' 5"  (1.651 m)   Wt 134 lb 6.4 oz (61 kg)   SpO2 97%   BMI 22.37 kg/m  Subsequent Medicare wellness visit  1. Risk factors, based on past  M,S,F history.  Cardiovascular risk factors include hypertension and dyslipidemia  2.  Physical activities: Remains fairly active without exercise limitations.  No regimented exercise program  3.  Depression/mood: History of major depression as well as generalized anxiety.  Remains on Wellbutrin as well as as needed alprazolam  4.  Hearing: No deficits  5.  ADL's: Independent  6.  Fall risk: Low  7.  Home safety: No problems identified  8.  Height weight, and visual acuity; height and weight stable no change in visual acuity 9.  Counseling: More regular exercise encouraged  10. Lab orders based on risk factors: Laboratory studies from October of last year reviewed  17. Referral : Follow-up GI.  He is scheduled for orthopedic evaluation due to left knee pain  12. Care plan: Continue efforts at aggressive risk factor modification.  He will attempt to minimize alprazolam and change zolpidem to as needed only  13. Cognitive assessment: Alert and appropriate normal affect.  No cognitive dysfunction  14. Screening: Patient provided with a written and personalized 5-10 year screening schedule in the AVS.    15. Provider List Update:  [facility]  Review of Systems  Constitutional: Negative for appetite change, chills, fatigue and fever.  HENT: Negative for congestion, dental problem, ear pain, hearing loss, sore throat, tinnitus, trouble swallowing and voice change.   Eyes: Negative for pain, discharge and visual disturbance.  Respiratory: Negative for cough, chest tightness, wheezing and stridor.   Cardiovascular: Negative for chest pain, palpitations and leg swelling.  Gastrointestinal: Negative for abdominal distention, abdominal pain, blood in stool, constipation, diarrhea, nausea and vomiting.  Genitourinary: Negative  for difficulty urinating, discharge, flank pain, genital sores, hematuria and urgency.  Musculoskeletal: Positive for arthralgias and joint swelling. Negative for back pain, gait problem, myalgias and neck stiffness.  Skin: Negative for rash.  Neurological: Negative for dizziness, syncope, speech difficulty, weakness, numbness and headaches.  Hematological: Negative for adenopathy. Does not bruise/bleed easily.  Psychiatric/Behavioral: Positive for decreased concentration, dysphoric mood and sleep disturbance. Negative for behavioral problems. The patient is nervous/anxious.        Objective:   Physical Exam  Constitutional: He is oriented to person, place, and time. He appears well-developed. No distress.  Weight 134 Blood pressure 118/70  HENT:  Head: Normocephalic.  Right Ear: External ear normal.  Left Ear: External ear normal.  Eyes: Conjunctivae and EOM are normal.  Neck: Normal range of motion.  Cardiovascular: Normal rate and normal heart sounds.  Heart rate slow with some mild respiratory variation  Pulmonary/Chest: Breath sounds normal.  Abdominal: Bowel sounds are normal.  Genitourinary: Prostate normal and penis normal. Rectal exam shows guaiac negative stool.  Musculoskeletal: Normal range of motion. He exhibits no edema or tenderness.  Neurological: He is alert and oriented to person, place, and time.  Psychiatric: He has a normal mood and affect. His behavior is normal.          Assessment & Plan:   Preventive health examination Subsequent Medicare wellness visit Essential hypertension stable.  No change in therapy Universal colitis.  Follow-up GI Dyslipidemia continue statin therapy Anxiety disorder.  Patient will attempt to minimize alprazolam use Insomnia.  Patient will attempt to use zolpidem as needed only and avoid nightly use History of ADHD.  Presently stable off Adderall  Patient return in 4 to 6 months for follow-up with a new provider  Marletta Lor

## 2018-09-14 ENCOUNTER — Other Ambulatory Visit: Payer: Self-pay | Admitting: Internal Medicine

## 2018-09-16 ENCOUNTER — Telehealth: Payer: Self-pay | Admitting: Internal Medicine

## 2018-09-16 NOTE — Telephone Encounter (Signed)
Copied from [place] [id number]. Topic: General - Other >> Sep 16, 2018  1:32 PM Oneta Rack wrote: Relation to pt: spouse  Call back number: [phone]    Reason for call:  Patient would like chart to reflect the following vaccinations adminstered by [facility]var 12/30/2015 and 05/12/18  pheumoinai vac 23 both injections given at [facility] [address] [place], [place] [address]. Patient received shingle vaccination 1st shot 9 second shot 09/09/18

## 2018-09-17 ENCOUNTER — Other Ambulatory Visit: Payer: Self-pay

## 2018-09-17 NOTE — Telephone Encounter (Signed)
List up dated.

## 2018-09-17 NOTE — Telephone Encounter (Signed)
Chart updated

## 2018-09-21 ENCOUNTER — Ambulatory Visit: Payer: Medicare HMO | Admitting: Osteopathic Medicine

## 2018-09-21 ENCOUNTER — Other Ambulatory Visit: Payer: Self-pay | Admitting: Internal Medicine

## 2018-09-21 NOTE — Telephone Encounter (Signed)
Okay for refill? Please advise

## 2018-09-29 DIAGNOSIS — R69 Illness, unspecified: Secondary | ICD-10-CM | POA: Diagnosis not present

## 2018-11-09 ENCOUNTER — Other Ambulatory Visit: Payer: Self-pay | Admitting: Internal Medicine

## 2018-11-11 ENCOUNTER — Ambulatory Visit (INDEPENDENT_AMBULATORY_CARE_PROVIDER_SITE_OTHER): Payer: Medicare HMO | Admitting: Family Medicine

## 2018-11-11 ENCOUNTER — Encounter: Payer: Self-pay | Admitting: Family Medicine

## 2018-11-11 ENCOUNTER — Other Ambulatory Visit: Payer: Self-pay

## 2018-11-11 VITALS — BP 144/88 | HR 81 | Temp 98.1°F | Ht 65.0 in | Wt 141.5 lb

## 2018-11-11 DIAGNOSIS — F900 Attention-deficit hyperactivity disorder, predominantly inattentive type: Secondary | ICD-10-CM | POA: Diagnosis not present

## 2018-11-11 DIAGNOSIS — F5104 Psychophysiologic insomnia: Secondary | ICD-10-CM

## 2018-11-11 DIAGNOSIS — E785 Hyperlipidemia, unspecified: Secondary | ICD-10-CM

## 2018-11-11 DIAGNOSIS — I10 Essential (primary) hypertension: Secondary | ICD-10-CM | POA: Diagnosis not present

## 2018-11-11 DIAGNOSIS — R69 Illness, unspecified: Secondary | ICD-10-CM | POA: Diagnosis not present

## 2018-11-11 NOTE — Patient Instructions (Signed)
Try to taper back the Xanax, as discussed.

## 2018-11-11 NOTE — Progress Notes (Signed)
Subjective:     Patient ID: Joseph Mejia, male   DOB: December 18, 1946, 72 y.o.   MRN: [id number]  HPI Patient is seen to establish with me.  He has history of hypertension, GERD, ulcerative colitis, irritable bowel syndrome, osteoarthritis, osteopenia, ADD, chronic anxiety, hyperlipidemia, chronic insomnia.  He had recent physical in September.  He is due for follow-up labs but is not fasting and declines getting these today.  He has taken Adderall low-dose in the past but is now retired and does not feel he any longer needs this.  He has taken once daily Xanax in the past and has been encouraged to taper this off.  He takes Ambien and has been on this for quite some time.  All medications reviewed.  Compliant with all.  No recent headaches or chest pains.  No dizziness.  Home blood pressures well controlled.  Past Medical History:  Diagnosis Date  . ADD 02/23/2008  . ANXIETY 08/03/2007  . ASTHMA 08/03/2007  . CHEST PAIN 11/17/2008  . Closed fracture of four ribs 06/30/2008  . DEPRESSION 08/03/2007  . ERECTILE DYSFUNCTION, ORGANIC 02/23/2008  . Failed moderate sedation during procedure 11/02/2008  . GERD 08/03/2007  . HAND PAIN, LEFT 06/20/2009  . HIP PAIN, RIGHT 05/27/2008  . HYPERLIPIDEMIA 08/03/2007  . HYPERTENSION 08/03/2007  . Irritable bowel syndrome 11/02/2008  . OSTEOARTHRITIS 11/02/2008  . OSTEOPENIA 07/26/2008  . RENAL DISEASE, CHRONIC, MILD 11/04/2008  . UNIVERSAL ULCERATIVE COLITIS 01/26/2009   Past Surgical History:  Procedure Laterality Date  . ANTERIOR FUSION CERVICAL SPINE  2012  . BASAL CELL CARCINOMA EXCISION Left    ear, head  . CARPAL TUNNEL RELEASE  2012  . CERVICAL FUSION  May 2016   C7  . COLONOSCOPY W/ BIOPSIES  06/2001, 08/2006, 07/2008   colon polyp, ulcerative colitis, lymphoid aggregates  . ELBOW SURGERY  01/2002   left  . HAND SURGERY  02/2010   basal thumb joint removal and tendon transplant([facility])  . INCISION AND DRAINAGE PERIRECTAL ABSCESS  1998  . [place]  . SHOULDER SURGERY  09/2007   injury at work, dislocated  . UPPER GASTROINTESTINAL ENDOSCOPY  06/2001   2cm hiatal hernia    reports that he has never smoked. He has never used smokeless tobacco. He reports that he drinks about 7.0 standard drinks of alcohol per week. He reports that he does not use drugs. family history includes COPD in his sister; Cancer in his father; Colon cancer (age of onset: 32) in his father; Heart disease in his brother and mother; Seizures (age of onset: 38) in his brother. Allergies  Allergen Reactions  . Procaine Palpitations  . Other Other (See Comments)    SKIN TEARS  . Promethazine Other (See Comments)  . Aspirin     REACTION: nose bleeds   . Mercaptopurine     REACTION: myalgias  . Procaine Hcl     REACTION: MAKES HEART RACE     Review of Systems  Constitutional: Negative for fatigue.  Eyes: Negative for visual disturbance.  Respiratory: Negative for cough, chest tightness and shortness of breath.   Cardiovascular: Negative for chest pain, palpitations and leg swelling.  Neurological: Negative for dizziness, syncope, weakness, light-headedness and headaches.       Objective:   Physical Exam  Constitutional: He is oriented to person, place, and time. He appears well-developed and well-nourished.  HENT:  Right Ear: External ear normal.  Left Ear: External ear normal.  Mouth/Throat: Oropharynx  is clear and moist.  Eyes: Pupils are equal, round, and reactive to light.  Neck: Neck supple. No thyromegaly present.  Cardiovascular: Normal rate and regular rhythm.  Pulmonary/Chest: Effort normal and breath sounds normal. No respiratory distress. He has no wheezes. He has no rales.  Musculoskeletal: He exhibits no edema.  Neurological: He is alert and oriented to person, place, and time.       Assessment:     #1 hypertension.  Slightly up today but controlled by home readings  #2 history of attention deficit disorder  #3 dyslipidemia.   Overdue for follow-up labs  #4 history of chronic insomnia    Plan:     -For fasting labs within 6 months. -We strongly advocated that he try to discontinue Adderall at this point and taper off Xanax.  Joseph Post MD [facility] at [facility]

## 2018-11-18 ENCOUNTER — Telehealth: Payer: Self-pay | Admitting: Family Medicine

## 2018-11-18 NOTE — Telephone Encounter (Signed)
We really aren't allowed to write for this in a manner that he is not taking.  I would have them check with their insurance to see if there is another less expensive alternative (eg Qvar, Pulmicort, etc)

## 2018-11-18 NOTE — Telephone Encounter (Signed)
Please see message.  Please advise. 

## 2018-11-18 NOTE — Telephone Encounter (Signed)
Copied from [place] [id number]. Topic: Quick Communication - See Telephone Encounter >> Nov 18, 2018  3:52 PM Sheran Luz wrote: CRM for notification. See Telephone encounter for: 11/18/18.  Patients wife, Hoyle Sauer, calling on behalf of patient stating that fluticasone (FLOVENT HFA) 110 MCG/ACT inhaler costs 94$ and would like to know if it were at all possible to write RX as 2 puffs a day, twice a day to "stretch out" medication, as to be more cost efficient. Please advise.

## 2018-11-20 NOTE — Telephone Encounter (Signed)
Patient's wife called back and was informed of the message below.  She agreed to call back with this info.

## 2018-11-20 NOTE — Telephone Encounter (Signed)
I left a message for the pt to call the office.  CRM also created.

## 2018-11-24 ENCOUNTER — Telehealth: Payer: Self-pay

## 2018-11-24 DIAGNOSIS — E785 Hyperlipidemia, unspecified: Secondary | ICD-10-CM

## 2018-11-24 DIAGNOSIS — I10 Essential (primary) hypertension: Secondary | ICD-10-CM

## 2018-11-24 NOTE — Telephone Encounter (Signed)
Copied from [place] [id number]. Topic: Appointment Scheduling - Scheduling Inquiry for Clinic >> Nov 24, 2018  8:22 AM Lennox Solders wrote: Reason for CRM:pt wife is calling and her husband  saw dr Raliegh Ip in [place] 2019 for a physical and did not have any blood work. Pt would like blood work before end of year

## 2018-11-24 NOTE — Telephone Encounter (Signed)
Pt has transferred care to [facility].

## 2018-11-25 NOTE — Telephone Encounter (Signed)
Lab appointment and orders placed.

## 2018-11-25 NOTE — Telephone Encounter (Signed)
Let's get lipid, BMP, hepatic (dxes= hyperlipidemia, hypertension)

## 2018-12-01 ENCOUNTER — Telehealth: Payer: Self-pay | Admitting: Family Medicine

## 2018-12-01 ENCOUNTER — Other Ambulatory Visit (INDEPENDENT_AMBULATORY_CARE_PROVIDER_SITE_OTHER): Payer: Medicare HMO

## 2018-12-01 DIAGNOSIS — E785 Hyperlipidemia, unspecified: Secondary | ICD-10-CM | POA: Diagnosis not present

## 2018-12-01 DIAGNOSIS — R944 Abnormal results of kidney function studies: Secondary | ICD-10-CM

## 2018-12-01 DIAGNOSIS — I10 Essential (primary) hypertension: Secondary | ICD-10-CM | POA: Diagnosis not present

## 2018-12-01 LAB — LIPID PANEL
CHOLESTEROL: 175 mg/dL (ref 0–200)
HDL: 46.5 mg/dL (ref >39.00)
LDL Cholesterol: 113 mg/dL — ABNORMAL HIGH (ref 0–99)
NONHDL: 128.74
Total CHOL/HDL Ratio: 4
Triglycerides: 80 mg/dL (ref 0.0–149.0)
VLDL: 16 mg/dL (ref 0.0–40.0)

## 2018-12-01 LAB — BASIC METABOLIC PANEL
BUN: 32 mg/dL — AB (ref 6–23)
CALCIUM: 9.3 mg/dL (ref 8.4–10.5)
CO2: 26 mEq/L (ref 19–32)
Chloride: 105 mEq/L (ref 96–112)
Creatinine, Ser: 1.87 mg/dL — ABNORMAL HIGH (ref 0.40–1.50)
GFR: 37.89 mL/min — AB (ref >60.00)
GLUCOSE: 97 mg/dL (ref 70–99)
Potassium: 4.1 mEq/L (ref 3.5–5.1)
SODIUM: 140 meq/L (ref 135–145)

## 2018-12-01 LAB — HEPATIC FUNCTION PANEL
ALT: 23 U/L (ref 0–53)
AST: 22 U/L (ref 0–37)
Albumin: 3.9 g/dL (ref 3.5–5.2)
Alkaline Phosphatase: 70 U/L (ref 39–117)
BILIRUBIN DIRECT: 0.1 mg/dL (ref 0.0–0.3)
TOTAL PROTEIN: 6.4 g/dL (ref 6.0–8.3)
Total Bilirubin: 0.6 mg/dL (ref 0.2–1.2)

## 2018-12-01 NOTE — Telephone Encounter (Signed)
Called patient and he stated that he is not able to stop taking the Ambien because his mind races at night and he stated that he was taking the low dose Adderall every other day and needs this to help him not feel so sluggish. Patient also let me know that he has stopped taking the Xanax.

## 2018-12-01 NOTE — Telephone Encounter (Signed)
I have only seen him one time.  My understanding is that he was not going to take any longer since retiring.  We will need to clarify.  Would be ideal to be off- especially as he has gotten older.

## 2018-12-01 NOTE — Telephone Encounter (Signed)
Patient needs a refill called in for adderall.

## 2018-12-01 NOTE — Telephone Encounter (Signed)
Last OV 11/11/18, No future OV  I do not see Adderall on current medication list?

## 2018-12-04 NOTE — Progress Notes (Signed)
Bmp

## 2018-12-04 NOTE — Addendum Note (Signed)
Addended by: Agnes Lawrence on: 12/04/2018 11:37 AM   Modules accepted: Orders

## 2018-12-07 NOTE — Telephone Encounter (Signed)
We had discontinued the Xanax and also recommended discontinuing the Adderall if possible because of increased associated risks with aging (hypertension, heart, cerebrovascular)

## 2018-12-08 NOTE — Telephone Encounter (Signed)
Patient is wanting to know what other options that he can do if he is not able to take the low dose Adderall every other day?  Please advise?

## 2018-12-09 NOTE — Telephone Encounter (Signed)
To avoid sluggishness, obviously getting enough sleep is important.  Reduced sugar and starch intake can also help.  Regular engagement in staying active/exercise helps.   Any stimulant medication will carry some risks- insomnia, arrhythmia, etc  Small doses of caffeine can help.

## 2018-12-09 NOTE — Telephone Encounter (Signed)
Called patient and LMOVM to return call  [place] for [facility] to Discuss results / PCP / recommendations / Schedule patient  Per Dr. Elease Hashimoto: To avoid sluggishness, obviously getting enough sleep is important.  Reduced sugar and starch intake can also help.  Regular engagement in staying active/exercise helps.   Any stimulant medication will carry some risks- insomnia, arrhythmia, etc  Small doses of caffeine can help. Try this instead of using the low dose Adderall.  CRM Created.

## 2018-12-11 ENCOUNTER — Telehealth: Payer: Self-pay | Admitting: Family Medicine

## 2018-12-11 NOTE — Telephone Encounter (Signed)
Pt given lab results per notes of Dr Elease Hashimoto on 12/01/18. Pt verbalized understanding. Lab appointment scheduled 12/25/18 per note.

## 2018-12-25 ENCOUNTER — Other Ambulatory Visit (INDEPENDENT_AMBULATORY_CARE_PROVIDER_SITE_OTHER): Payer: Medicare HMO

## 2018-12-25 DIAGNOSIS — R944 Abnormal results of kidney function studies: Secondary | ICD-10-CM | POA: Diagnosis not present

## 2018-12-25 LAB — BASIC METABOLIC PANEL
BUN: 31 mg/dL — AB (ref 6–23)
CALCIUM: 9.2 mg/dL (ref 8.4–10.5)
CO2: 28 meq/L (ref 19–32)
CREATININE: 1.64 mg/dL — AB (ref 0.40–1.50)
Chloride: 106 mEq/L (ref 96–112)
GFR: 44.08 mL/min — ABNORMAL LOW (ref >60.00)
GLUCOSE: 88 mg/dL (ref 70–99)
Potassium: 4.6 mEq/L (ref 3.5–5.1)
Sodium: 143 mEq/L (ref 135–145)

## 2018-12-28 DIAGNOSIS — Z961 Presence of intraocular lens: Secondary | ICD-10-CM | POA: Diagnosis not present

## 2018-12-28 DIAGNOSIS — H43813 Vitreous degeneration, bilateral: Secondary | ICD-10-CM | POA: Diagnosis not present

## 2018-12-28 DIAGNOSIS — H52209 Unspecified astigmatism, unspecified eye: Secondary | ICD-10-CM | POA: Diagnosis not present

## 2019-01-01 ENCOUNTER — Telehealth: Payer: Self-pay | Admitting: Family Medicine

## 2019-01-01 NOTE — Telephone Encounter (Signed)
Called patient and gave him lab results: Renal function is improved. Stay well hydrated.  Recommend 3 month follow up and will repeat BMP then. Avoid all NSAIDS.(eg Advil, Aleve).   Patient verbalized an understanding.

## 2019-01-01 NOTE — Telephone Encounter (Signed)
Copied from [place] [phone]. Topic: Quick Communication - See Telephone Encounter >> Jan 01, 2019 12:20 PM Blase Mess A wrote: CRM for notification. See Telephone encounter for: 01/01/19.  Patient is calling to receive lab results from 12/25/18 Please advise [phone]

## 2019-01-04 ENCOUNTER — Encounter: Payer: Self-pay | Admitting: Internal Medicine

## 2019-01-08 ENCOUNTER — Encounter: Payer: Self-pay | Admitting: Internal Medicine

## 2019-01-12 ENCOUNTER — Other Ambulatory Visit: Payer: Self-pay | Admitting: Family Medicine

## 2019-01-12 NOTE — Telephone Encounter (Signed)
Last filled by Dr. Sherren Mocha. OK to continue filling?

## 2019-01-12 NOTE — Telephone Encounter (Signed)
Requested medication (s) are due for refill today: yes  Requested medication (s) are on the active medication list: yes  Last refill:  11/09/18  Future visit scheduled: yes  Notes to clinic:  Pt of Dr Sherren Mocha    Requested Prescriptions  Pending Prescriptions Disp Refills   dicyclomine (BENTYL) 20 MG tablet 60 tablet 0    Sig: Take 1 tablet (20 mg total) by mouth every 6 (six) hours.     Gastroenterology:  Antispasmodic Agents Passed - 01/12/2019 10:59 AM      Passed - Last Heart Rate in normal range    Pulse Readings from Last 1 Encounters:  11/11/18 81         Passed - Valid encounter within last 12 months    Recent Outpatient Visits          2 months ago Essential hypertension   [facility] at [facility], Alinda Sierras, MD   4 months ago Encounter for preventive health examination   [employer] [facility], Doretha Sou, MD   6 months ago Mild intermittent asthma without complication   Therap[facility] at [facility], Doretha Sou, MD   7 months ago Essential hypertension   [facility]rap[facility] at [facility], Doretha Sou, MD   7 months ago Essential hypertension   Therapist, [facility] at [facility], Alinda Sierras, MD      Future Appointments            In 2 months [place], Alinda Sierras, MD [place] at [place], [facility]

## 2019-01-12 NOTE — Telephone Encounter (Signed)
Copied from [place] [phone]. Topic: Quick Communication - Rx Refill/Question >> Jan 12, 2019 10:50 AM Windy Kalata wrote: Medication: dicyclomine (BENTYL) 20 MG tablet   Has the patient contacted their pharmacy? Yes.   (Agent: If no, request that the patient contact the pharmacy for the refill.) (Agent: If yes, when and what did the pharmacy advise?) Call office for refill  Preferred Pharmacy (with phone number or street name): [facility] [place], [place] [address] [phone] (Phone) [phone] (Fax)    Agent: Please be advised that RX refills may take up to 3 business days. We ask that you follow-up with your pharmacy.

## 2019-01-12 NOTE — Telephone Encounter (Signed)
Refill OK

## 2019-01-14 MED ORDER — DICYCLOMINE HCL 20 MG PO TABS: 20.0000 mg | ORAL_TABLET | Freq: Four times a day (QID) | ORAL | 0 refills | Status: DC

## 2019-02-05 ENCOUNTER — Encounter: Payer: Self-pay | Admitting: Internal Medicine

## 2019-02-05 ENCOUNTER — Ambulatory Visit (AMBULATORY_SURGERY_CENTER): Payer: Self-pay | Admitting: *Deleted

## 2019-02-05 VITALS — Ht 66.0 in | Wt 141.0 lb

## 2019-02-05 DIAGNOSIS — K5100 Ulcerative (chronic) pancolitis without complications: Secondary | ICD-10-CM

## 2019-02-05 DIAGNOSIS — Z800 Family history of malignant neoplasm of digestive organs: Secondary | ICD-10-CM

## 2019-02-05 NOTE — Progress Notes (Signed)
Patient denies any allergies to eggs or soy. Patient denies any problems with anesthesia/sedation. Patient denies any oxygen use at home. Patient denies taking any diet/weight loss medications or blood thinners. EMMI education offered, pt declined.  

## 2019-02-19 ENCOUNTER — Encounter: Payer: Self-pay | Admitting: Internal Medicine

## 2019-02-19 ENCOUNTER — Encounter: Payer: Medicare HMO | Admitting: Internal Medicine

## 2019-02-19 ENCOUNTER — Ambulatory Visit (AMBULATORY_SURGERY_CENTER): Payer: Medicare Other | Admitting: Internal Medicine

## 2019-02-19 VITALS — BP 106/54 | HR 68 | Temp 97.8°F | Resp 16 | Ht 66.0 in | Wt 141.0 lb

## 2019-02-19 DIAGNOSIS — K5100 Ulcerative (chronic) pancolitis without complications: Secondary | ICD-10-CM | POA: Diagnosis not present

## 2019-02-19 DIAGNOSIS — K6389 Other specified diseases of intestine: Secondary | ICD-10-CM

## 2019-02-19 DIAGNOSIS — Z800 Family history of malignant neoplasm of digestive organs: Secondary | ICD-10-CM

## 2019-02-19 MED ORDER — SODIUM CHLORIDE 0.9 % IV SOLN: 500.0000 mL | Freq: Once | INTRAVENOUS | Status: DC

## 2019-02-19 NOTE — Progress Notes (Signed)
PT taken to PACU. Monitors in place. VSS. Report given to RN. 

## 2019-02-19 NOTE — Op Note (Signed)
[place] Patient Name: Joseph Mejia Procedure Date: 02/19/2019 12:03 PM MRN: [id number] Endoscopist: Gatha Mayer , MD Age: 73 Referring MD:  Date of Birth: October 04, 1946 Gender: Male Account #: [id number] Procedure:                Colonoscopy Indications:              High risk colon cancer surveillance: Ulcerative                            pancolitis of 8 (or more) years duration Medicines:                Propofol per Anesthesia, Monitored Anesthesia Care Procedure:                Pre-Anesthesia Assessment:                           - Prior to the procedure, a History and Physical                            was performed, and patient medications and                            allergies were reviewed. The patient's tolerance of                            previous anesthesia was also reviewed. The risks                            and benefits of the procedure and the sedation                            options and risks were discussed with the patient.                            All questions were answered, and informed consent                            was obtained. Prior Anticoagulants: The patient has                            taken no previous anticoagulant or antiplatelet                            agents. ASA Grade Assessment: III - A patient with                            severe systemic disease. After reviewing the risks                            and benefits, the patient was deemed in                            satisfactory condition to undergo the procedure.  After obtaining informed consent, the colonoscope                            was passed under direct vision. Throughout the                            procedure, the patient's blood pressure, pulse, and                            oxygen saturations were monitored continuously. The                            Colonoscope was introduced through the anus and        advanced to the the cecum, identified by                            appendiceal orifice and ileocecal valve. The                            colonoscopy was performed without difficulty. The                            patient tolerated the procedure well. The quality                            of the bowel preparation was good. The appendiceal                            orifice and the rectum were photographed. Scope In: 12:10:28 PM Scope Out: 12:26:07 PM Scope Withdrawal Time: 0 hours 12 minutes 23 seconds  Total Procedure Duration: 0 hours 15 minutes 39 seconds  Findings:                 The perianal and digital rectal examinations were                            normal. Pertinent negatives include normal prostate                            (size, shape, and consistency).                           The colon (entire examined portion) appeared                            normal. Biopsies were taken with a cold forceps for                            histology. Verification of patient identification                            for the specimen was done. Estimated blood loss was  minimal.                           Anal papilla(e) were hypertrophied.                           The exam was otherwise without abnormality on                            direct and retroflexion views. Complications:            No immediate complications. Estimated Blood Loss:     Estimated blood loss was minimal. Impression:               - The entire examined colon is normal. Biopsied.                           - Anal papilla(e) were hypertrophied.                           - The examination was otherwise normal on direct                            and retroflexion views. Recommendation:           - Patient has a contact number available for                            emergencies. The signs and symptoms of potential                            delayed complications were discussed with  the                            patient. Return to normal activities tomorrow.                            Written discharge instructions were provided to the                            patient.                           - Resume previous diet.                           - Continue present medications.                           - Repeat colonoscopy is possibly recommended. The                            colonoscopy date will be determined after pathology                            results from today's exam become available for  review.                           He has been in endoscopic and histologic remission                            for many years and is now off treatment. Not sure                            he needs further UC surveillance. His father did                            have colon cancer at age 69 - so given UC and FHx                            will consider repeat in 5 years but would likely                            discuss in office first.                           cc: Joseph Manly DO [address] Gatha Mayer, MD 02/19/2019 12:36:03 PM This report has been signed electronically.

## 2019-02-19 NOTE — Progress Notes (Signed)
Pt's states no medical or surgical changes since previsit or office visit. 

## 2019-02-19 NOTE — Progress Notes (Signed)
Called to room to assist during endoscopic procedure.  Patient ID and intended procedure confirmed with present staff. Received instructions for my participation in the procedure from the performing physician.  

## 2019-02-19 NOTE — Patient Instructions (Addendum)
   Everything looks normal. I took biopsies again. If these are ok as I suspect they are I think considering a repeat in 5 years is what I will recommend.  I appreciate the opportunity to care for you. Gatha Mayer, MD, FACG   YOU HAD AN ENDOSCOPIC PROCEDURE TODAY AT [facility]:   Refer to the procedure report that was given to you for any specific questions about what was found during the examination.  If the procedure report does not answer your questions, please call your gastroenterologist to clarify.  If you requested that your care partner not be given the details of your procedure findings, then the procedure report has been included in a sealed envelope for you to review at your convenience later.  YOU SHOULD EXPECT: Some feelings of bloating in the abdomen. Passage of more gas than usual.  Walking can help get rid of the air that was put into your GI tract during the procedure and reduce the bloating. If you had a lower endoscopy (such as a colonoscopy or flexible sigmoidoscopy) you may notice spotting of blood in your stool or on the toilet paper. If you underwent a bowel prep for your procedure, you may not have a normal bowel movement for a few days.  Please Note:  You might notice some irritation and congestion in your nose or some drainage.  This is from the oxygen used during your procedure.  There is no need for concern and it should clear up in a day or so.  SYMPTOMS TO REPORT IMMEDIATELY:   Following lower endoscopy (colonoscopy or flexible sigmoidoscopy):  Excessive amounts of blood in the stool  Significant tenderness or worsening of abdominal pains  Swelling of the abdomen that is new, acute  Fever of 100F or higher   For urgent or emergent issues, a gastroenterologist can be reached at any hour by calling [phone].   DIET:  We do recommend a small meal at first, but then you may proceed to your regular diet.  Drink plenty of fluids but  you should avoid alcoholic beverages for 24 hours.  ACTIVITY:  You should plan to take it easy for the rest of today and you should NOT DRIVE or use heavy machinery until tomorrow (because of the sedation medicines used during the test).    FOLLOW UP: Our staff will call the number listed on your records the next business day following your procedure to check on you and address any questions or concerns that you may have regarding the information given to you following your procedure. If we do not reach you, we will leave a message.  However, if you are feeling well and you are not experiencing any problems, there is no need to return our call.  We will assume that you have returned to your regular daily activities without incident.  If any biopsies were taken you will be contacted by phone or by letter within the next 1-3 weeks.  Please call us at [phone] if you have not heard about the biopsies in 3 weeks.    SIGNATURES/CONFIDENTIALITY: You and/or your care partner have signed paperwork which will be entered into your electronic medical record.  These signatures attest to the fact that that the information above on your After Visit Summary has been reviewed and is understood.  Full responsibility of the confidentiality of this discharge information lies with you and/or your care-partner.

## 2019-02-22 ENCOUNTER — Telehealth: Payer: Self-pay

## 2019-02-22 NOTE — Telephone Encounter (Signed)
  Follow up Call-  Call back number 02/19/2019  Post procedure Call Back phone  # [phone]  Permission to leave phone message Yes  Some recent data might be hidden     Patient questions:  Do you have a fever, pain , or abdominal swelling? No. Pain Score  0 *  Have you tolerated food without any problems? Yes.    Have you been able to return to your normal activities? Yes.    Do you have any questions about your discharge instructions: Diet   No. Medications  No. Follow up visit  No.  Do you have questions or concerns about your Care? No.  Actions: * If pain score is 4 or above: No action needed, pain <4.

## 2019-02-23 ENCOUNTER — Encounter: Payer: Self-pay | Admitting: Internal Medicine

## 2019-02-23 NOTE — Progress Notes (Signed)
Negative bxs Office visit recall 2025

## 2019-03-08 ENCOUNTER — Other Ambulatory Visit: Payer: Self-pay | Admitting: Family Medicine

## 2019-03-18 ENCOUNTER — Ambulatory Visit: Payer: Medicare Other | Admitting: Rehabilitative and Restorative Service Providers"

## 2019-03-26 ENCOUNTER — Ambulatory Visit: Payer: Medicare Other | Admitting: Family Medicine

## 2019-11-08 ENCOUNTER — Other Ambulatory Visit: Payer: Self-pay | Admitting: Family Medicine

## 2019-12-03 ENCOUNTER — Encounter: Payer: Self-pay | Admitting: Internal Medicine

## 2020-01-03 ENCOUNTER — Other Ambulatory Visit: Payer: Self-pay | Admitting: Family Medicine

## 2020-11-15 ENCOUNTER — Telehealth: Payer: Self-pay | Admitting: Family Medicine

## 2020-11-15 NOTE — Telephone Encounter (Signed)
Is it okay to schedule this patient to establish care with you? 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

## 2020-11-15 NOTE — Telephone Encounter (Signed)
Patient is scheduled for 12/03 at 10 AM

## 2020-11-15 NOTE — Telephone Encounter (Signed)
Yes

## 2020-12-01 ENCOUNTER — Encounter: Payer: Self-pay | Admitting: Internal Medicine

## 2020-12-01 ENCOUNTER — Ambulatory Visit (INDEPENDENT_AMBULATORY_CARE_PROVIDER_SITE_OTHER): Payer: Medicare Other | Admitting: Internal Medicine

## 2020-12-01 ENCOUNTER — Other Ambulatory Visit: Payer: Self-pay

## 2020-12-01 VITALS — BP 120/80 | HR 100 | Temp 98.3°F | Ht 65.0 in | Wt 140.9 lb

## 2020-12-01 DIAGNOSIS — J4520 Mild intermittent asthma, uncomplicated: Secondary | ICD-10-CM

## 2020-12-01 DIAGNOSIS — K219 Gastro-esophageal reflux disease without esophagitis: Secondary | ICD-10-CM

## 2020-12-01 DIAGNOSIS — F411 Generalized anxiety disorder: Secondary | ICD-10-CM | POA: Diagnosis not present

## 2020-12-01 DIAGNOSIS — E785 Hyperlipidemia, unspecified: Secondary | ICD-10-CM | POA: Diagnosis not present

## 2020-12-01 DIAGNOSIS — I10 Essential (primary) hypertension: Secondary | ICD-10-CM

## 2020-12-01 DIAGNOSIS — F900 Attention-deficit hyperactivity disorder, predominantly inattentive type: Secondary | ICD-10-CM | POA: Diagnosis not present

## 2020-12-01 DIAGNOSIS — F339 Major depressive disorder, recurrent, unspecified: Secondary | ICD-10-CM | POA: Diagnosis not present

## 2020-12-01 MED ORDER — AMPHETAMINE-DEXTROAMPHETAMINE 5 MG PO TABS: 5.0000 mg | ORAL_TABLET | Freq: Every day | ORAL | 0 refills | Status: DC

## 2020-12-01 NOTE — Progress Notes (Signed)
Established Patient Office Visit     This visit occurred during the SARS-CoV-2 public health emergency.  Safety protocols were in place, including screening questions prior to the visit, additional usage of staff PPE, and extensive cleaning of exam room while observing appropriate contact time as indicated for disinfecting solutions.    CC/Reason for Visit: Establish care, discuss chronic medical conditions, medication refills  HPI: Joseph Mejia is a 74 y.o. male who is coming in today for the above mentioned reasons. Past Medical History is significant for: Hypertension, hyperlipidemia, GERD, asthma, insomnia, depression, ADHD.  He used to see Dr. Raliegh Ip but then moved his care to [facility] and is now back at this practice at his wife's insistence who is also a patient.  He used to work in [employer] but is currently retired, he does not smoke, he drinks alcohol only occasionally.  He has allergies to latex.  He has had many surgeries including bilateral rotator cuff repair, neck fusion x2, lumbar fusion remotely.  His family history is mainly significant for a mother with coronary artery disease and a father who died at age 29 from colon cancer.  He had a colonoscopy in 2028 and was told that he no longer needed further colonoscopies due to his age.  He needs refills of Adderall today.  He has had all 3 Covid vaccines and flu vaccine.  He has no acute complaints today.   Past Medical/Surgical History: Past Medical History:  Diagnosis Date  . ADD 02/23/2008  . ANXIETY 08/03/2007  . ASTHMA 08/03/2007  . Asthma   . Cancer ([place])    skin cancer   . CHEST PAIN 11/17/2008  . Closed fracture of four ribs 06/30/2008  . DEPRESSION 08/03/2007  . ERECTILE DYSFUNCTION, ORGANIC 02/23/2008  . Failed moderate sedation during procedure 11/02/2008  . GERD 08/03/2007  . HAND PAIN, LEFT 06/20/2009  . Heart murmur   . HIP PAIN, RIGHT 05/27/2008  . HYPERLIPIDEMIA 08/03/2007  . HYPERTENSION  08/03/2007  . Irritable bowel syndrome 11/02/2008  . OSTEOARTHRITIS 11/02/2008  . OSTEOPENIA 07/26/2008  . RENAL DISEASE, CHRONIC, MILD 11/04/2008  . UNIVERSAL ULCERATIVE COLITIS 01/26/2009    Past Surgical History:  Procedure Laterality Date  . ANTERIOR FUSION CERVICAL SPINE  2012  . BASAL CELL CARCINOMA EXCISION Left    ear, head  . CARPAL TUNNEL RELEASE  2012  . CERVICAL FUSION  May 2016   C7  . COLONOSCOPY W/ BIOPSIES  06/2001, 08/2006, 07/2008   colon polyp, ulcerative colitis, lymphoid aggregates  . ELBOW SURGERY  01/2002   left  . HAND SURGERY  02/2010   basal thumb joint removal and tendon transplant([facility])  . INCISION AND DRAINAGE PERIRECTAL ABSCESS  1998  . [place]  . SHOULDER SURGERY  09/2007   injury at work, dislocated  . UPPER GASTROINTESTINAL ENDOSCOPY  06/2001   2cm hiatal hernia    Social History:  reports that he has never smoked. He has never used smokeless tobacco. He reports current alcohol use of about 1.0 standard drink of alcohol per week. He reports that he does not use drugs.  Allergies: Allergies  Allergen Reactions  . Novocain [Procaine] Palpitations    Novocain   . Other Other (See Comments)    SKIN TEARS  . Promethazine Other (See Comments)  . Aspirin     REACTION: nose bleeds   . Latex Other (See Comments)    Tears skin  . Mercaptopurine  REACTION: myalgias  . Procaine Hcl     REACTION: MAKES HEART RACE    Family History:  Family History  Problem Relation Age of Onset  . Heart disease Mother        s/p CABG age 12  . Cancer Father        colon , brain  . Colon cancer Father 40  . COPD Sister   . Heart disease Brother        CAD  . Seizures Brother 4  . Colon polyps Neg Hx   . Esophageal cancer Neg Hx   . Rectal cancer Neg Hx   . Stomach cancer Neg Hx      Current Outpatient Medications:  .  ALPRAZolam (XANAX) 0.25 MG tablet, 0.25 mg. Take half tab daily as needed, Disp: , Rfl:  .   amphetamine-dextroamphetamine (ADDERALL) 5 MG tablet, Take 1 tablet (5 mg total) by mouth daily., Disp: 90 tablet, Rfl: 0 .  atorvastatin (LIPITOR) 40 MG tablet, Take 1 tablet (40 mg total) by mouth daily., Disp: 90 tablet, Rfl: 3 .  buPROPion (WELLBUTRIN SR) 150 MG 12 hr tablet, Take 1 tablet (150 mg total) by mouth every morning., Disp: 90 tablet, Rfl: 4 .  Cyanocobalamin (VITAMIN B 12 PO), Take 5,000 mcg by mouth daily., Disp: , Rfl:  .  dicyclomine (BENTYL) 20 MG tablet, TAKE ONE TABLET BY MOUTH EVERY 6 HOURS, Disp: 60 tablet, Rfl: 0 .  diphenhydrAMINE (BENADRYL) 25 MG tablet, Take 25 mg by mouth as needed., Disp: , Rfl:  .  fexofenadine (ALLEGRA) 180 MG tablet, Take 180 mg by mouth daily as needed., Disp: , Rfl:  .  fluticasone (FLOVENT HFA) 110 MCG/ACT inhaler, Inhale 1 puff into the lungs 2 (two) times daily., Disp: 1 Inhaler, Rfl: 12 .  levocetirizine (XYZAL) 5 MG tablet, Take 5 mg by mouth every evening., Disp: , Rfl:  .  lisinopril (PRINIVIL,ZESTRIL) 10 MG tablet, Take 1 tablet (10 mg total) by mouth daily., Disp: 90 tablet, Rfl: 4 .  Multiple Vitamin (MULTIVITAMIN) tablet, Take 1 tablet by mouth daily.  , Disp: , Rfl:  .  omeprazole (PRILOSEC) 20 MG capsule, Take 1 capsule (20 mg total) by mouth daily., Disp: 90 capsule, Rfl: 2 .  OVER THE COUNTER MEDICATION, EQ Nasal Spray, Disp: , Rfl:  .  PROAIR HFA 108 (90 Base) MCG/ACT inhaler, INHALE 2 PUFFS INTO THE LUNGS 4 TIMES DAILY, Disp: 25.5 each, Rfl: 2 .  traZODone (DESYREL) 50 MG tablet, Take 50 mg by mouth at bedtime., Disp: , Rfl:   Current Facility-Administered Medications:  .  0.9 %  sodium chloride infusion, 500 mL, Intravenous, Once, Gatha Mayer, MD  Review of Systems:  Constitutional: Denies fever, chills, diaphoresis, appetite change and fatigue.  HEENT: Denies photophobia, eye pain, redness, hearing loss, ear pain, congestion, sore throat, rhinorrhea, sneezing, mouth sores, trouble swallowing, neck pain, neck stiffness and  tinnitus.   Respiratory: Denies SOB, DOE, cough, chest tightness,  and wheezing.   Cardiovascular: Denies chest pain, palpitations and leg swelling.  Gastrointestinal: Denies nausea, vomiting, abdominal pain, diarrhea, constipation, blood in stool and abdominal distention.  Genitourinary: Denies dysuria, urgency, frequency, hematuria, flank pain and difficulty urinating.  Endocrine: Denies: hot or cold intolerance, sweats, changes in hair or nails, polyuria, polydipsia. Musculoskeletal: Denies myalgias, back pain, joint swelling, arthralgias and gait problem.  Skin: Denies pallor, rash and wound.  Neurological: Denies dizziness, seizures, syncope, weakness, light-headedness, numbness and headaches.  Hematological: Denies adenopathy. Easy bruising,  personal or family bleeding history  Psychiatric/Behavioral: Denies suicidal ideation, mood changes, confusion, nervousness, sleep disturbance and agitation    Physical Exam: Vitals:   12/01/20 1014  BP: 120/80  Pulse: 100  Temp: 98.3 F (36.8 C)  TempSrc: Oral  SpO2: 94%  Weight: 140 lb 14.4 oz (63.9 kg)  Height: 5' 5"  (1.651 m)    Body mass index is 23.45 kg/m.   Constitutional: NAD, calm, comfortable Eyes: PERRL, lids and conjunctivae normal, wears corrective lenses ENMT: Mucous membranes are moist. Respiratory: clear to auscultation bilaterally, no wheezing, no crackles. Normal respiratory effort. No accessory muscle use.  Cardiovascular: Regular rate and rhythm, no murmurs / rubs / gallops. No extremity edema.  Neurologic: Grossly intact and nonfocal Psychiatric: Normal judgment and insight. Alert and oriented x 3. Normal mood.    Impression and Plan:  Anxiety state Depression, recurrent ([place]) -Mood appears stable, he is on Wellbutrin and half of a 0.25 mg of Xanax every night. -He is not followed by psychiatry.  Attention deficit hyperactivity disorder (ADHD), predominantly inattentive type  -I have agreed to refill his  Adderall 5 mg that he takes once a day. -He understands that he will need to have a check in every 3 months for controlled substance prescription, he agrees.  Essential hypertension -Blood pressures well controlled today on lisinopril 10 mg's.  Gastroesophageal reflux disease without esophagitis -Symptoms are well controlled on daily omeprazole 20 mg.  Mild intermittent asthma without complication -Well-controlled on daily Flovent and as needed albuterol.  Hyperlipidemia, unspecified hyperlipidemia type -Last LDL on file was 113 in 2019, for now continue Lipitor 40 mg daily. -Check lipids when he returns for CPE.    Patient Instructions  -Nice seeing you today!!  -Schedule follow up in 3 months for your physical. Please come in fasting that day.     Lelon Frohlich, MD [facility] at [facility]

## 2020-12-01 NOTE — Patient Instructions (Signed)
-  Nice seeing you today!!  -Schedule follow up in 3 months for your physical. Please come in fasting that day.

## 2021-03-01 ENCOUNTER — Other Ambulatory Visit: Payer: Self-pay

## 2021-03-02 ENCOUNTER — Ambulatory Visit (INDEPENDENT_AMBULATORY_CARE_PROVIDER_SITE_OTHER): Payer: Medicare Other | Admitting: Internal Medicine

## 2021-03-02 ENCOUNTER — Encounter: Payer: Self-pay | Admitting: Internal Medicine

## 2021-03-02 VITALS — BP 130/80 | HR 50 | Temp 98.0°F | Ht 65.0 in | Wt 142.4 lb

## 2021-03-02 DIAGNOSIS — E785 Hyperlipidemia, unspecified: Secondary | ICD-10-CM

## 2021-03-02 DIAGNOSIS — F900 Attention-deficit hyperactivity disorder, predominantly inattentive type: Secondary | ICD-10-CM

## 2021-03-02 DIAGNOSIS — F411 Generalized anxiety disorder: Secondary | ICD-10-CM

## 2021-03-02 DIAGNOSIS — F339 Major depressive disorder, recurrent, unspecified: Secondary | ICD-10-CM

## 2021-03-02 DIAGNOSIS — Z0000 Encounter for general adult medical examination without abnormal findings: Secondary | ICD-10-CM | POA: Diagnosis not present

## 2021-03-02 DIAGNOSIS — K219 Gastro-esophageal reflux disease without esophagitis: Secondary | ICD-10-CM

## 2021-03-02 DIAGNOSIS — R1319 Other dysphagia: Secondary | ICD-10-CM

## 2021-03-02 DIAGNOSIS — I10 Essential (primary) hypertension: Secondary | ICD-10-CM | POA: Diagnosis not present

## 2021-03-02 LAB — TSH: TSH: 2.5 u[IU]/mL (ref 0.35–4.50)

## 2021-03-02 LAB — CBC WITH DIFFERENTIAL/PLATELET
Basophils Absolute: 0 10*3/uL (ref 0.0–0.1)
Basophils Relative: 0.6 % (ref 0.0–3.0)
Eosinophils Absolute: 0.5 10*3/uL (ref 0.0–0.7)
Eosinophils Relative: 7.4 % — ABNORMAL HIGH (ref 0.0–5.0)
HCT: 39.9 % (ref 39.0–52.0)
Hemoglobin: 13.8 g/dL (ref 13.0–17.0)
Lymphocytes Relative: 38.2 % (ref 12.0–46.0)
Lymphs Abs: 2.3 10*3/uL (ref 0.7–4.0)
MCHC: 34.5 g/dL (ref 30.0–36.0)
MCV: 91.1 fl (ref 78.0–100.0)
Monocytes Absolute: 0.7 10*3/uL (ref 0.1–1.0)
Monocytes Relative: 11.1 % (ref 3.0–12.0)
Neutro Abs: 2.6 10*3/uL (ref 1.4–7.7)
Neutrophils Relative %: 42.7 % — ABNORMAL LOW (ref 43.0–77.0)
Platelets: 178 10*3/uL (ref 150.0–400.0)
RBC: 4.38 Mil/uL (ref 4.22–5.81)
RDW: 12.8 % (ref 11.5–15.5)
WBC: 6.1 10*3/uL (ref 4.0–10.5)

## 2021-03-02 LAB — COMPREHENSIVE METABOLIC PANEL
ALT: 25 U/L (ref 0–53)
AST: 24 U/L (ref 0–37)
Albumin: 3.9 g/dL (ref 3.5–5.2)
Alkaline Phosphatase: 82 U/L (ref 39–117)
BUN: 25 mg/dL — ABNORMAL HIGH (ref 6–23)
CO2: 30 mEq/L (ref 19–32)
Calcium: 9.3 mg/dL (ref 8.4–10.5)
Chloride: 104 mEq/L (ref 96–112)
Creatinine, Ser: 1.73 mg/dL — ABNORMAL HIGH (ref 0.40–1.50)
GFR: 38.43 mL/min — ABNORMAL LOW (ref >60.00)
Glucose, Bld: 93 mg/dL (ref 70–99)
Potassium: 4.5 mEq/L (ref 3.5–5.1)
Sodium: 141 mEq/L (ref 135–145)
Total Bilirubin: 0.5 mg/dL (ref 0.2–1.2)
Total Protein: 6.3 g/dL (ref 6.0–8.3)

## 2021-03-02 LAB — LIPID PANEL
Cholesterol: 157 mg/dL (ref 0–200)
HDL: 43.1 mg/dL (ref >39.00)
LDL Cholesterol: 93 mg/dL (ref 0–99)
NonHDL: 114.35
Total CHOL/HDL Ratio: 4
Triglycerides: 109 mg/dL (ref 0.0–149.0)
VLDL: 21.8 mg/dL (ref 0.0–40.0)

## 2021-03-02 LAB — VITAMIN D 25 HYDROXY (VIT D DEFICIENCY, FRACTURES): VITD: 69.96 ng/mL (ref 30.00–100.00)

## 2021-03-02 LAB — VITAMIN B12: Vitamin B-12: >1506 pg/mL — ABNORMAL HIGH (ref 211–911)

## 2021-03-02 LAB — HEMOGLOBIN A1C: Hgb A1c MFr Bld: 5.5 % (ref 4.6–6.5)

## 2021-03-02 LAB — PSA: PSA: 0.76 ng/mL (ref 0.10–4.00)

## 2021-03-02 NOTE — Patient Instructions (Signed)
-Nice seeing you today!!  -Lab work today; will notify you once results are available.  -Schedule follow up in 6 months or sooner as needed.   Preventive Care 75 Years and Older, Male Preventive care refers to lifestyle choices and visits with your health care provider that can promote health and wellness. This includes:  A yearly physical exam. This is also called an annual wellness visit.  Regular dental and eye exams.  Immunizations.  Screening for certain conditions.  Healthy lifestyle choices, such as: ? Eating a healthy diet. ? Getting regular exercise. ? Not using drugs or products that contain nicotine and tobacco. ? Limiting alcohol use. What can I expect for my preventive care visit? Physical exam Your health care provider will check your:  Height and weight. These may be used to calculate your BMI (body mass index). BMI is a measurement that tells if you are at a healthy weight.  Heart rate and blood pressure.  Body temperature.  Skin for abnormal spots. Counseling Your health care provider may ask you questions about your:  Past medical problems.  Family's medical history.  Alcohol, tobacco, and drug use.  Emotional well-being.  Home life and relationship well-being.  Sexual activity.  Diet, exercise, and sleep habits.  History of falls.  Memory and ability to understand (cognition).  Work and work Statistician.  Access to firearms. What immunizations do I need? Vaccines are usually given at various ages, according to a schedule. Your health care provider will recommend vaccines for you based on your age, medical history, and lifestyle or other factors, such as travel or where you work.   What tests do I need? Blood tests  Lipid and cholesterol levels. These may be checked every 5 years, or more often depending on your overall health.  Hepatitis C test.  Hepatitis B test. Screening  Lung cancer screening. You may have this screening  every year starting at age 67 if you have a 30-pack-year history of smoking and currently smoke or have quit within the past 15 years.  Colorectal cancer screening. ? All adults should have this screening starting at age 56 and continuing until age 59. ? Your health care provider may recommend screening at age 59 if you are at increased risk. ? You will have tests every 1-10 years, depending on your results and the type of screening test.  Prostate cancer screening. Recommendations will vary depending on your family history and other risks.  Genital exam to check for testicular cancer or hernias.  Diabetes screening. ? This is done by checking your blood sugar (glucose) after you have not eaten for a while (fasting). ? You may have this done every 1-3 years.  Abdominal aortic aneurysm (AAA) screening. You may need this if you are a current or former smoker.  STD (sexually transmitted disease) testing, if you are at risk. Follow these instructions at home: Eating and drinking  Eat a diet that includes fresh fruits and vegetables, whole grains, lean protein, and low-fat dairy products. Limit your intake of foods with high amounts of sugar, saturated fats, and salt.  Take vitamin and mineral supplements as recommended by your health care provider.  Do not drink alcohol if your health care provider tells you not to drink.  If you drink alcohol: ? Limit how much you have to 0-2 drinks a day. ? Be aware of how much alcohol is in your drink. In the [place] one drink equals one 12 oz bottle of beer (355 mL),  one 5 oz glass of wine (148 mL), or one 1 oz glass of hard liquor (44 mL).   Lifestyle  Take daily care of your teeth and gums. Brush your teeth every morning and night with fluoride toothpaste. Floss one time each day.  Stay active. Exercise for at least 30 minutes 5 or more days each week.  Do not use any products that contain nicotine or tobacco, such as cigarettes, e-cigarettes,  and chewing tobacco. If you need help quitting, ask your health care provider.  Do not use drugs.  If you are sexually active, practice safe sex. Use a condom or other form of protection to prevent STIs (sexually transmitted infections).  Talk with your health care provider about taking a low-dose aspirin or statin.  Find healthy ways to cope with stress, such as: ? Meditation, yoga, or listening to music. ? Journaling. ? Talking to a trusted person. ? Spending time with friends and family. Safety  Always wear your seat belt while driving or riding in a vehicle.  Do not drive: ? If you have been drinking alcohol. Do not ride with someone who has been drinking. ? When you are tired or distracted. ? While texting.  Wear a helmet and other protective equipment during sports activities.  If you have firearms in your house, make sure you follow all gun safety procedures. What's next?  Visit your health care provider once a year for an annual wellness visit.  Ask your health care provider how often you should have your eyes and teeth checked.  Stay up to date on all vaccines. This information is not intended to replace advice given to you by your health care provider. Make sure you discuss any questions you have with your health care provider. Document Revised: 09/14/2019 Document Reviewed: 12/10/2018 Elsevier Patient Education  2021 [employer].

## 2021-03-02 NOTE — Progress Notes (Signed)
Established Patient Office Visit     This visit occurred during the SARS-CoV-2 public health emergency.  Safety protocols were in place, including screening questions prior to the visit, additional usage of staff PPE, and extensive cleaning of exam room while observing appropriate contact time as indicated for disinfecting solutions.    CC/Reason for Visit: Annual preventive exam and subsequent Medicare wellness visit  HPI: Joseph Mejia is a 75 y.o. male who is coming in today for the above mentioned reasons. Past Medical History is significant for: Hypertension, hyperlipidemia, GERD, asthma, insomnia, depression, ADHD.  He has had no acute issues since we last spoke.  He would like to try and go off ADHD medication.  He has routine eye and dental care, regular history of lactose intolerance and has noted some abdominal bloating after eating ice cream.  He has had a history of dysphagia with a previous esophageal dilatation, he has started to notice some difficulty swallowing again.  His immunizations are up-to-date.   Past Medical/Surgical History: Past Medical History:  Diagnosis Date  . ADD 02/23/2008  . ANXIETY 08/03/2007  . ASTHMA 08/03/2007  . Asthma   . Cancer ([place])    skin cancer   . CHEST PAIN 11/17/2008  . Closed fracture of four ribs 06/30/2008  . DEPRESSION 08/03/2007  . ERECTILE DYSFUNCTION, ORGANIC 02/23/2008  . Failed moderate sedation during procedure 11/02/2008  . GERD 08/03/2007  . HAND PAIN, LEFT 06/20/2009  . Heart murmur   . HIP PAIN, RIGHT 05/27/2008  . HYPERLIPIDEMIA 08/03/2007  . HYPERTENSION 08/03/2007  . Irritable bowel syndrome 11/02/2008  . OSTEOARTHRITIS 11/02/2008  . OSTEOPENIA 07/26/2008  . RENAL DISEASE, CHRONIC, MILD 11/04/2008  . UNIVERSAL ULCERATIVE COLITIS 01/26/2009    Past Surgical History:  Procedure Laterality Date  . ANTERIOR FUSION CERVICAL SPINE  2012  . BASAL CELL CARCINOMA EXCISION Left    ear, head  . CARPAL TUNNEL RELEASE  2012  .  CERVICAL FUSION  May 2016   C7  . COLONOSCOPY W/ BIOPSIES  06/2001, 08/2006, 07/2008   colon polyp, ulcerative colitis, lymphoid aggregates  . ELBOW SURGERY  01/2002   left  . HAND SURGERY  02/2010   basal thumb joint removal and tendon transplant([facility])  . INCISION AND DRAINAGE PERIRECTAL ABSCESS  1998  . [place]  . SHOULDER SURGERY  09/2007   injury at work, dislocated  . UPPER GASTROINTESTINAL ENDOSCOPY  06/2001   2cm hiatal hernia    Social History:  reports that he has never smoked. He has never used smokeless tobacco. He reports current alcohol use of about 1.0 standard drink of alcohol per week. He reports that he does not use drugs.  Allergies: Allergies  Allergen Reactions  . Novocain [Procaine] Palpitations    Novocain   . Other Other (See Comments)    SKIN TEARS  . Promethazine Other (See Comments)  . Aspirin     REACTION: nose bleeds   . Latex Other (See Comments)    Tears skin  . Mercaptopurine     REACTION: myalgias  . Procaine Hcl     REACTION: MAKES HEART RACE    Family History:  Family History  Problem Relation Age of Onset  . Heart disease Mother        s/p CABG age 47  . Cancer Father        colon , brain  . Colon cancer Father 10  . COPD Sister   . Heart  disease Brother        CAD  . Seizures Brother 8  . Colon polyps Neg Hx   . Esophageal cancer Neg Hx   . Rectal cancer Neg Hx   . Stomach cancer Neg Hx      Current Outpatient Medications:  .  ALPRAZolam (XANAX) 0.25 MG tablet, 0.25 mg. Take half tab daily as needed, Disp: , Rfl:  .  atorvastatin (LIPITOR) 40 MG tablet, Take 1 tablet (40 mg total) by mouth daily., Disp: 90 tablet, Rfl: 3 .  buPROPion (WELLBUTRIN SR) 150 MG 12 hr tablet, Take 1 tablet (150 mg total) by mouth every morning., Disp: 90 tablet, Rfl: 4 .  Cyanocobalamin (VITAMIN B 12 PO), Take 5,000 mcg by mouth daily., Disp: , Rfl:  .  dicyclomine (BENTYL) 20 MG tablet, TAKE ONE TABLET BY MOUTH EVERY 6 HOURS,  Disp: 60 tablet, Rfl: 0 .  diphenhydrAMINE (BENADRYL) 25 MG tablet, Take 25 mg by mouth as needed., Disp: , Rfl:  .  fexofenadine (ALLEGRA) 180 MG tablet, Take 180 mg by mouth daily as needed., Disp: , Rfl:  .  fluticasone (FLOVENT HFA) 110 MCG/ACT inhaler, Inhale 1 puff into the lungs 2 (two) times daily., Disp: 1 Inhaler, Rfl: 12 .  levocetirizine (XYZAL) 5 MG tablet, Take 5 mg by mouth every evening., Disp: , Rfl:  .  lisinopril (PRINIVIL,ZESTRIL) 10 MG tablet, Take 1 tablet (10 mg total) by mouth daily., Disp: 90 tablet, Rfl: 4 .  Multiple Vitamin (MULTIVITAMIN) tablet, Take 1 tablet by mouth daily., Disp: , Rfl:  .  omeprazole (PRILOSEC) 20 MG capsule, Take 1 capsule (20 mg total) by mouth daily., Disp: 90 capsule, Rfl: 2 .  OVER THE COUNTER MEDICATION, EQ Nasal Spray, Disp: , Rfl:  .  PROAIR HFA 108 (90 Base) MCG/ACT inhaler, INHALE 2 PUFFS INTO THE LUNGS 4 TIMES DAILY, Disp: 25.5 each, Rfl: 2 .  traZODone (DESYREL) 50 MG tablet, Take 50 mg by mouth at bedtime., Disp: , Rfl:   Current Facility-Administered Medications:  .  0.9 %  sodium chloride infusion, 500 mL, Intravenous, Once, Gatha Mayer, MD  Review of Systems:  Constitutional: Denies fever, chills, diaphoresis, appetite change and fatigue.  HEENT: Denies photophobia, eye pain, redness, hearing loss, ear pain, congestion, sore throat, rhinorrhea, sneezing, mouth sores,  neck pain, neck stiffness and tinnitus.   Respiratory: Denies SOB, DOE, cough, chest tightness,  and wheezing.   Cardiovascular: Denies chest pain, palpitations and leg swelling.  Gastrointestinal: Denies nausea, vomiting, abdominal pain, diarrhea, constipation, blood in stool and abdominal distention.  Genitourinary: Denies dysuria, urgency, frequency, hematuria, flank pain and difficulty urinating.  Endocrine: Denies: hot or cold intolerance, sweats, changes in hair or nails, polyuria, polydipsia. Musculoskeletal: Denies myalgias, back pain, joint swelling,  arthralgias and gait problem.  Skin: Denies pallor, rash and wound.  Neurological: Denies dizziness, seizures, syncope, weakness, light-headedness, numbness and headaches.  Hematological: Denies adenopathy. Easy bruising, personal or family bleeding history  Psychiatric/Behavioral: Denies suicidal ideation, mood changes, confusion, nervousness, sleep disturbance and agitation    Physical Exam: Vitals:   03/02/21 0729  BP: 130/80  Pulse: (!) 50  Temp: 98 F (36.7 C)  TempSrc: Oral  SpO2: 93%  Weight: 142 lb 6.4 oz (64.6 kg)  Height: 5' 5"  (1.651 m)    Body mass index is 23.7 kg/m.   Constitutional: NAD, calm, comfortable Eyes: PERRL, lids and conjunctivae normal, wears corrective lenses ENMT: Mucous membranes are moist. Posterior pharynx clear of any exudate  or lesions. Normal dentition. Tympanic membrane is pearly white, no erythema or bulging. Neck: normal, supple, no masses, no thyromegaly Respiratory: clear to auscultation bilaterally, no wheezing, no crackles. Normal respiratory effort. No accessory muscle use.  Cardiovascular: Regular rate and rhythm, no murmurs / rubs / gallops. No extremity edema. 2+ pedal pulses. No carotid bruits.  Abdomen: no tenderness, no masses palpated. No hepatosplenomegaly. Bowel sounds positive.  Musculoskeletal: no clubbing / cyanosis. No joint deformity upper and lower extremities. Good ROM, no contractures. Normal muscle tone.  Skin: no rashes, lesions, ulcers. No induration Neurologic: CN 2-12 grossly intact. Sensation intact, DTR normal. Strength 5/5 in all 4.  Psychiatric: Normal judgment and insight. Alert and oriented x 3. Normal mood.    Subsequent Medicare wellness visit   1. Risk factors, based on past  M,S,F -cardiovascular disease risk factors include age, gender, history of hypertension, history of hyperlipidemia   2.  Physical activities: He walks 30 minutes 2 days a week   3.  Depression/mood:  History of depression but  mood is currently stable   4.  Hearing:  No perceived issues   5.  ADL's: Independent in all ADLs   6.  Fall risk:  Low fall risk   7.  Home safety: No problems identified   8.  Height weight, and visual acuity: height and weight as above, vision:   Visual Acuity Screening   Right eye Left eye Both eyes  Without correction:     With correction: 20/20 20/25 20/20      9.  Counseling:  Advised to increase physical activity   10. Lab orders based on risk factors: Laboratory update will be reviewed   11. Referral :  None today   12. Care plan:  Follow-up with me in 6 months   13. Cognitive assessment:  No cognitive impairment   14. Screening: Patient provided with a written and personalized 5-10 year screening schedule in the AVS.   yes   15. Provider List Update:   PCP only  16. Advance Directives: Full code   17. Opioids: Patient is not on any opioid prescriptions and has no risk factors for a substance use disorder.   [facility] Office Visit from 03/02/2021 in [facility]  PHQ-9 Total Score 1      Fall Risk  12/01/2020 09/08/2018 08/22/2015 08/18/2014 02/11/2014  Falls in the past year? 0 No Yes No No  Number falls in past yr: 0 - 1 - -  Injury with Fall? 0 - No - -  Risk for fall due to : - - Impaired balance/gait - -     Impression and Plan:  Encounter for preventive health examination  -He has routine eye and dental care. -All immunizations are up-to-date including COVID and influenza. -Screening labs today. -Healthy lifestyle discussed in detail. -PSA today. -He had his last colonoscopy in 2020 and was informed he needed no further.  Attention deficit hyperactivity disorder (ADHD), predominantly inattentive type -He would like to try and go off Adderall, I agree.  Dyslipidemia -Check lipids today, last LDL was 113 in 2019.  He remains on atorvastatin 40 mg daily.  Depression, recurrent ([place]) Anxiety state  - Plan: TSH, Vitamin  B12 -Mood is currently stable on Wellbutrin 150 mg daily and Xanax 0.25 mg for which she takes half a tablet as needed.  Gastroesophageal reflux disease without esophagitis -Well-controlled on daily omeprazole 20 mg.  Essential hypertension  - Plan: CBC with Differential/Platelet, Comprehensive metabolic panel -  Well-controlled.  Esophageal dysphagia -He has had a previous esophageal dilatation, he is starting to notice some early dysphagia.  He would like to hold off on GI referral for now.    Patient Instructions   -Nice seeing you today!!  -Lab work today; will notify you once results are available.  -Schedule follow up in 6 months or sooner as needed.   Preventive Care 26 Years and Older, Male Preventive care refers to lifestyle choices and visits with your health care provider that can promote health and wellness. This includes:  A yearly physical exam. This is also called an annual wellness visit.  Regular dental and eye exams.  Immunizations.  Screening for certain conditions.  Healthy lifestyle choices, such as: ? Eating a healthy diet. ? Getting regular exercise. ? Not using drugs or products that contain nicotine and tobacco. ? Limiting alcohol use. What can I expect for my preventive care visit? Physical exam Your health care provider will check your:  Height and weight. These may be used to calculate your BMI (body mass index). BMI is a measurement that tells if you are at a healthy weight.  Heart rate and blood pressure.  Body temperature.  Skin for abnormal spots. Counseling Your health care provider may ask you questions about your:  Past medical problems.  Family's medical history.  Alcohol, tobacco, and drug use.  Emotional well-being.  Home life and relationship well-being.  Sexual activity.  Diet, exercise, and sleep habits.  History of falls.  Memory and ability to understand (cognition).  Work and work Statistician.  Access  to firearms. What immunizations do I need? Vaccines are usually given at various ages, according to a schedule. Your health care provider will recommend vaccines for you based on your age, medical history, and lifestyle or other factors, such as travel or where you work.   What tests do I need? Blood tests  Lipid and cholesterol levels. These may be checked every 5 years, or more often depending on your overall health.  Hepatitis C test.  Hepatitis B test. Screening  Lung cancer screening. You may have this screening every year starting at age 28 if you have a 30-pack-year history of smoking and currently smoke or have quit within the past 15 years.  Colorectal cancer screening. ? All adults should have this screening starting at age 26 and continuing until age 72. ? Your health care provider may recommend screening at age 34 if you are at increased risk. ? You will have tests every 1-10 years, depending on your results and the type of screening test.  Prostate cancer screening. Recommendations will vary depending on your family history and other risks.  Genital exam to check for testicular cancer or hernias.  Diabetes screening. ? This is done by checking your blood sugar (glucose) after you have not eaten for a while (fasting). ? You may have this done every 1-3 years.  Abdominal aortic aneurysm (AAA) screening. You may need this if you are a current or former smoker.  STD (sexually transmitted disease) testing, if you are at risk. Follow these instructions at home: Eating and drinking  Eat a diet that includes fresh fruits and vegetables, whole grains, lean protein, and low-fat dairy products. Limit your intake of foods with high amounts of sugar, saturated fats, and salt.  Take vitamin and mineral supplements as recommended by your health care provider.  Do not drink alcohol if your health care provider tells you not to drink.  If you  drink alcohol: ? Limit how much you  have to 0-2 drinks a day. ? Be aware of how much alcohol is in your drink. In the [place] one drink equals one 12 oz bottle of beer (355 mL), one 5 oz glass of wine (148 mL), or one 1 oz glass of hard liquor (44 mL).   Lifestyle  Take daily care of your teeth and gums. Brush your teeth every morning and night with fluoride toothpaste. Floss one time each day.  Stay active. Exercise for at least 30 minutes 5 or more days each week.  Do not use any products that contain nicotine or tobacco, such as cigarettes, e-cigarettes, and chewing tobacco. If you need help quitting, ask your health care provider.  Do not use drugs.  If you are sexually active, practice safe sex. Use a condom or other form of protection to prevent STIs (sexually transmitted infections).  Talk with your health care provider about taking a low-dose aspirin or statin.  Find healthy ways to cope with stress, such as: ? Meditation, yoga, or listening to music. ? Journaling. ? Talking to a trusted person. ? Spending time with friends and family. Safety  Always wear your seat belt while driving or riding in a vehicle.  Do not drive: ? If you have been drinking alcohol. Do not ride with someone who has been drinking. ? When you are tired or distracted. ? While texting.  Wear a helmet and other protective equipment during sports activities.  If you have firearms in your house, make sure you follow all gun safety procedures. What's next?  Visit your health care provider once a year for an annual wellness visit.  Ask your health care provider how often you should have your eyes and teeth checked.  Stay up to date on all vaccines. This information is not intended to replace advice given to you by your health care provider. Make sure you discuss any questions you have with your health care provider. Document Revised: 09/14/2019 Document Reviewed: 12/10/2018 Elsevier Patient Education  2021 [place], MD [facility]

## 2021-03-02 NOTE — Addendum Note (Signed)
Addended by: Tessie Fass D on: 03/02/2021 08:00 AM   Modules accepted: Orders

## 2021-03-06 ENCOUNTER — Other Ambulatory Visit: Payer: Self-pay | Admitting: Internal Medicine

## 2021-03-06 DIAGNOSIS — N189 Chronic kidney disease, unspecified: Secondary | ICD-10-CM

## 2021-03-15 DIAGNOSIS — N1832 Chronic kidney disease, stage 3b: Secondary | ICD-10-CM | POA: Diagnosis not present

## 2021-03-15 DIAGNOSIS — I129 Hypertensive chronic kidney disease with stage 1 through stage 4 chronic kidney disease, or unspecified chronic kidney disease: Secondary | ICD-10-CM | POA: Diagnosis not present

## 2021-03-15 DIAGNOSIS — K5190 Ulcerative colitis, unspecified, without complications: Secondary | ICD-10-CM | POA: Diagnosis not present

## 2021-03-15 DIAGNOSIS — E785 Hyperlipidemia, unspecified: Secondary | ICD-10-CM | POA: Diagnosis not present

## 2021-03-16 ENCOUNTER — Other Ambulatory Visit: Payer: Self-pay | Admitting: Nephrology

## 2021-03-16 DIAGNOSIS — N1832 Chronic kidney disease, stage 3b: Secondary | ICD-10-CM

## 2021-03-22 ENCOUNTER — Ambulatory Visit
Admission: RE | Admit: 2021-03-22 | Discharge: 2021-03-22 | Disposition: A | Discharge status: Home / Self Care | Payer: Medicare Other | Source: Ambulatory Visit | Attending: Nephrology | Admitting: Nephrology

## 2021-03-22 DIAGNOSIS — N1832 Chronic kidney disease, stage 3b: Secondary | ICD-10-CM

## 2021-03-22 DIAGNOSIS — N189 Chronic kidney disease, unspecified: Secondary | ICD-10-CM | POA: Diagnosis not present

## 2021-03-22 DIAGNOSIS — N281 Cyst of kidney, acquired: Secondary | ICD-10-CM | POA: Diagnosis not present

## 2021-03-27 DIAGNOSIS — D485 Neoplasm of uncertain behavior of skin: Secondary | ICD-10-CM | POA: Diagnosis not present

## 2021-03-27 DIAGNOSIS — D2371 Other benign neoplasm of skin of right lower limb, including hip: Secondary | ICD-10-CM | POA: Diagnosis not present

## 2021-03-27 DIAGNOSIS — D225 Melanocytic nevi of trunk: Secondary | ICD-10-CM | POA: Diagnosis not present

## 2021-03-27 DIAGNOSIS — L578 Other skin changes due to chronic exposure to nonionizing radiation: Secondary | ICD-10-CM | POA: Diagnosis not present

## 2021-03-27 DIAGNOSIS — L821 Other seborrheic keratosis: Secondary | ICD-10-CM | POA: Diagnosis not present

## 2021-03-27 DIAGNOSIS — D044 Carcinoma in situ of skin of scalp and neck: Secondary | ICD-10-CM | POA: Diagnosis not present

## 2021-03-27 DIAGNOSIS — L570 Actinic keratosis: Secondary | ICD-10-CM | POA: Diagnosis not present

## 2021-06-11 ENCOUNTER — Telehealth: Payer: Self-pay | Admitting: Internal Medicine

## 2021-06-11 NOTE — Telephone Encounter (Signed)
fluticasone (FLOVENT HFA) 110 MCG/ACT inhaler  He does 2 puffs daily and he only has enough of puffs for tomorrow.  [place], [address] Phone:  [phone]  Fax:  [phone]

## 2021-06-14 ENCOUNTER — Other Ambulatory Visit: Payer: Self-pay | Admitting: Internal Medicine

## 2021-06-14 DIAGNOSIS — J3089 Other allergic rhinitis: Secondary | ICD-10-CM

## 2021-06-14 MED ORDER — FLUTICASONE PROPIONATE HFA 110 MCG/ACT IN AERO: 1.0000 | INHALATION_SPRAY | Freq: Two times a day (BID) | RESPIRATORY_TRACT | 12 refills | Status: DC

## 2021-06-14 NOTE — Telephone Encounter (Signed)
Message sent to PCP as last Rx was given by Dr Burnice Logan.

## 2021-06-14 NOTE — Telephone Encounter (Signed)
Patient informed of the message below.

## 2021-06-25 ENCOUNTER — Telehealth: Payer: Self-pay | Admitting: Internal Medicine

## 2021-06-25 NOTE — Telephone Encounter (Signed)
Pt's spouse is calling in stating that they would like to have fluticasone ([place] HFA) 110 MCG they would like to have it to go to mail order [facility] with the instructions stating 2 puffs by mouth for asthma twice daily #90.  Pt would like for the one sent to [facility] cancelled due to it being so expensive with them and [facility] is cheaper.

## 2021-06-26 ENCOUNTER — Other Ambulatory Visit: Payer: Self-pay | Admitting: Internal Medicine

## 2021-06-26 DIAGNOSIS — J3089 Other allergic rhinitis: Secondary | ICD-10-CM

## 2021-06-26 MED ORDER — FLUTICASONE PROPIONATE HFA 110 MCG/ACT IN AERO
1.0000 | INHALATION_SPRAY | Freq: Two times a day (BID) | RESPIRATORY_TRACT | 12 refills | Status: DC
Start: 2021-06-26 — End: 2022-07-15

## 2021-08-10 LAB — HM HEPATITIS C SCREENING LAB: HM Hepatitis Screen: NEGATIVE

## 2021-09-12 ENCOUNTER — Ambulatory Visit (INDEPENDENT_AMBULATORY_CARE_PROVIDER_SITE_OTHER): Payer: Medicare Other | Admitting: Internal Medicine

## 2021-09-12 ENCOUNTER — Encounter: Payer: Self-pay | Admitting: Internal Medicine

## 2021-09-12 ENCOUNTER — Other Ambulatory Visit: Payer: Self-pay

## 2021-09-12 VITALS — BP 120/80 | HR 73 | Temp 98.0°F | Wt 146.5 lb

## 2021-09-12 DIAGNOSIS — F411 Generalized anxiety disorder: Secondary | ICD-10-CM

## 2021-09-12 DIAGNOSIS — H6123 Impacted cerumen, bilateral: Secondary | ICD-10-CM

## 2021-09-12 DIAGNOSIS — F900 Attention-deficit hyperactivity disorder, predominantly inattentive type: Secondary | ICD-10-CM | POA: Diagnosis not present

## 2021-09-12 MED ORDER — AMPHETAMINE-DEXTROAMPHETAMINE 5 MG PO TABS: 5.0000 mg | ORAL_TABLET | Freq: Every day | ORAL | 0 refills | Status: DC

## 2021-09-12 MED ORDER — ALPRAZOLAM 0.25 MG PO TABS
0.2500 mg | ORAL_TABLET | Freq: Every evening | ORAL | 1 refills | Status: DC | PRN
Start: 2021-09-12 — End: 2022-01-28

## 2021-09-12 NOTE — Progress Notes (Signed)
Established Patient Office Visit     This visit occurred during the SARS-CoV-2 public health emergency.  Safety protocols were in place, including screening questions prior to the visit, additional usage of staff PPE, and extensive cleaning of exam room while observing appropriate contact time as indicated for disinfecting solutions.    CC/Reason for Visit: Difficulty hearing out of right ear, medication refills  HPI: Joseph Mejia is a 75 y.o. male who is coming in today for the above mentioned reasons.  He has a history of anxiety disorder on follow-up low-dose as needed, requesting refills today.  In March she had decided to wean himself off Adderall which she did successfully a few months ago.  He would like to go back on Adderall due to difficulty completing tasks and forgetfulness.  He was only on 5 mg daily.  For the past week or so he has been noting some difficulty hearing out of his right ear.  He states this got significantly worse after shower where he felt like water got into his ear.  He denies any pain, discharge or fever.  Past Medical/Surgical History: Past Medical History:  Diagnosis Date   ADD 02/23/2008   ANXIETY 08/03/2007   ASTHMA 08/03/2007   Asthma    Cancer ([place])    skin cancer    CHEST PAIN 11/17/2008   Closed fracture of four ribs 06/30/2008   DEPRESSION 08/03/2007   ERECTILE DYSFUNCTION, ORGANIC 02/23/2008   Failed moderate sedation during procedure 11/02/2008   GERD 08/03/2007   HAND PAIN, LEFT 06/20/2009   Heart murmur    HIP PAIN, RIGHT 05/27/2008   HYPERLIPIDEMIA 08/03/2007   HYPERTENSION 08/03/2007   Irritable bowel syndrome 11/02/2008   OSTEOARTHRITIS 11/02/2008   OSTEOPENIA 07/26/2008   RENAL DISEASE, CHRONIC, MILD 11/04/2008   UNIVERSAL ULCERATIVE COLITIS 01/26/2009    Past Surgical History:  Procedure Laterality Date   ANTERIOR FUSION CERVICAL SPINE  2012   BASAL CELL CARCINOMA EXCISION Left    ear, head   CARPAL TUNNEL RELEASE  2012    CERVICAL FUSION  May 2016   C7   COLONOSCOPY W/ BIOPSIES  06/2001, 08/2006, 07/2008   colon polyp, ulcerative colitis, lymphoid aggregates   ELBOW SURGERY  01/2002   left   HAND SURGERY  02/2010   basal thumb joint removal and tendon transplant([facility])   INCISION AND DRAINAGE [place]   SHOULDER SURGERY  09/2007   injury at work, dislocated   UPPER GASTROINTESTINAL ENDOSCOPY  06/2001   2cm hiatal hernia    Social History:  reports that he has never smoked. He has never used smokeless tobacco. He reports current alcohol use of about 1.0 standard drink per week. He reports that he does not use drugs.  Allergies: Allergies  Allergen Reactions   Novocain [Procaine] Palpitations    Novocain    Other Other (See Comments)    SKIN TEARS   Promethazine Other (See Comments)   Aspirin     REACTION: nose bleeds    Latex Other (See Comments)    Tears skin   Mercaptopurine     REACTION: myalgias   Procaine Hcl     REACTION: MAKES HEART RACE    Family History:  Family History  Problem Relation Age of Onset   Heart disease Mother        s/p CABG age 71   Cancer Father        colon ,  brain   Colon cancer Father 80   COPD Sister    Heart disease Brother        CAD   Seizures Brother 24   Colon polyps Neg Hx    Esophageal cancer Neg Hx    Rectal cancer Neg Hx    Stomach cancer Neg Hx      Current Outpatient Medications:    amphetamine-dextroamphetamine (ADDERALL) 5 MG tablet, Take 1 tablet (5 mg total) by mouth daily., Disp: 30 tablet, Rfl: 0   amphetamine-dextroamphetamine (ADDERALL) 5 MG tablet, Take 1 tablet (5 mg total) by mouth daily., Disp: 30 tablet, Rfl: 0   amphetamine-dextroamphetamine (ADDERALL) 5 MG tablet, Take 1 tablet (5 mg total) by mouth daily., Disp: 30 tablet, Rfl: 0   atorvastatin (LIPITOR) 40 MG tablet, Take 1 tablet (40 mg total) by mouth daily., Disp: 90 tablet, Rfl: 3   buPROPion (WELLBUTRIN SR) 150 MG 12 hr  tablet, Take 1 tablet (150 mg total) by mouth every morning., Disp: 90 tablet, Rfl: 4   Cyanocobalamin (VITAMIN B 12 PO), Take 5,000 mcg by mouth daily., Disp: , Rfl:    dicyclomine (BENTYL) 20 MG tablet, TAKE ONE TABLET BY MOUTH EVERY 6 HOURS, Disp: 60 tablet, Rfl: 0   diphenhydrAMINE (BENADRYL) 25 MG tablet, Take 25 mg by mouth as needed., Disp: , Rfl:    fexofenadine (ALLEGRA) 180 MG tablet, Take 180 mg by mouth daily as needed., Disp: , Rfl:    fluticasone (FLOVENT HFA) 110 MCG/ACT inhaler, Inhale 1 puff into the lungs 2 (two) times daily., Disp: 1 each, Rfl: 12   lisinopril (PRINIVIL,ZESTRIL) 10 MG tablet, Take 1 tablet (10 mg total) by mouth daily., Disp: 90 tablet, Rfl: 4   omeprazole (PRILOSEC) 20 MG capsule, Take 1 capsule (20 mg total) by mouth daily., Disp: 90 capsule, Rfl: 2   PROAIR HFA 108 (90 Base) MCG/ACT inhaler, INHALE 2 PUFFS INTO THE LUNGS 4 TIMES DAILY, Disp: 25.5 each, Rfl: 2   ALPRAZolam (XANAX) 0.25 MG tablet, Take 1 tablet (0.25 mg total) by mouth at bedtime as needed for anxiety. Take half tab daily as needed, Disp: 30 tablet, Rfl: 1  Current Facility-Administered Medications:    0.9 %  sodium chloride infusion, 500 mL, Intravenous, Once, Gatha Mayer, MD  Review of Systems:  Constitutional: Denies fever, chills, diaphoresis, appetite change and fatigue.  HEENT: Denies photophobia, eye pain, redness, congestion, sore throat, rhinorrhea, sneezing, mouth sores, trouble swallowing, neck pain, neck stiffness and tinnitus.   Respiratory: Denies SOB, DOE, cough, chest tightness,  and wheezing.   Cardiovascular: Denies chest pain, palpitations and leg swelling.  Gastrointestinal: Denies nausea, vomiting, abdominal pain, diarrhea, constipation, blood in stool and abdominal distention.  Genitourinary: Denies dysuria, urgency, frequency, hematuria, flank pain and difficulty urinating.  Endocrine: Denies: hot or cold intolerance, sweats, changes in hair or nails, polyuria,  polydipsia. Musculoskeletal: Denies myalgias, back pain, joint swelling, arthralgias and gait problem.  Skin: Denies pallor, rash and wound.  Neurological: Denies dizziness, seizures, syncope, weakness, light-headedness, numbness and headaches.  Hematological: Denies adenopathy. Easy bruising, personal or family bleeding history  Psychiatric/Behavioral: Denies suicidal ideation, mood changes, confusion, nervousness, sleep disturbance and agitation    Physical Exam: Vitals:   09/12/21 1259  BP: 120/80  Pulse: 73  Temp: 98 F (36.7 C)  TempSrc: Oral  SpO2: 97%  Weight: 146 lb 8 oz (66.5 kg)    Body mass index is 24.38 kg/m.   Constitutional: NAD, calm, comfortable Eyes: PERRL, lids and  conjunctivae normal, wears corrective lenses ENMT: Mucous membranes are moist. . Tympanic membrane is obstructed by cerumen bilaterally Neck: normal, supple, no masses, no thyromegaly Neurologic: Grossly intact and nonfocal Psychiatric: Normal judgment and insight. Alert and oriented x 3. Normal mood.    Impression and Plan:  Bilateral hearing loss due to cerumen impaction -Cerumen Desimpaction  After patient consent was obtained, warm water was applied and gentle ear lavage performed on bilateral ears. There were no complications and following the desimpaction the tympanic membranes were visible. Tympanic membranes are intact following the procedure. Auditory canals are normal. The patient reported relief of symptoms after removal of cerumen.   Attention deficit hyperactivity disorder (ADHD), predominantly inattentive type  -Okay to resume Adderall 5 mg daily, have provided 30 tablets a month x3 months, he knows he will need repeat visit in 3 months for controlled substance refill.  Anxiety state  - Plan: ALPRAZolam (XANAX) 0.25 MG tablet  Time spent: 33 minutes reviewing chart, interviewing and examining patient, providing irrigation of ears and developing plan of care.     Lelon Frohlich, MD  Primary Care at [facility]

## 2021-09-26 ENCOUNTER — Encounter: Payer: Self-pay | Admitting: Internal Medicine

## 2021-11-14 DIAGNOSIS — M549 Dorsalgia, unspecified: Secondary | ICD-10-CM | POA: Diagnosis not present

## 2021-11-14 DIAGNOSIS — N1832 Chronic kidney disease, stage 3b: Secondary | ICD-10-CM | POA: Diagnosis not present

## 2021-11-14 DIAGNOSIS — N281 Cyst of kidney, acquired: Secondary | ICD-10-CM | POA: Diagnosis not present

## 2021-11-14 DIAGNOSIS — E785 Hyperlipidemia, unspecified: Secondary | ICD-10-CM | POA: Diagnosis not present

## 2021-11-14 DIAGNOSIS — I129 Hypertensive chronic kidney disease with stage 1 through stage 4 chronic kidney disease, or unspecified chronic kidney disease: Secondary | ICD-10-CM | POA: Diagnosis not present

## 2021-11-15 ENCOUNTER — Other Ambulatory Visit: Payer: Self-pay | Admitting: Nephrology

## 2021-11-15 ENCOUNTER — Telehealth: Payer: Self-pay | Admitting: Internal Medicine

## 2021-11-15 DIAGNOSIS — N1832 Chronic kidney disease, stage 3b: Secondary | ICD-10-CM

## 2021-11-15 NOTE — Telephone Encounter (Signed)
Patient spouse came in today stating that the patient needs a refill for amphetamine-dextroamphetamine (ADDERALL) 5 MG tablet [[id number] to be sent to their pharmacy.  Patient could be contacted at [phone].  Please advise.

## 2021-11-16 ENCOUNTER — Telehealth: Payer: Self-pay

## 2021-11-16 NOTE — Telephone Encounter (Signed)
Spoke with pharmacist and a refill will be ready today.

## 2021-11-16 NOTE — Telephone Encounter (Signed)
Wife of patient called requesting Rx refills 30 day supply send to [facility]  omeprazole (PRILOSEC) 20 MG capsule    [facility]  atorvastatin (LIPITOR) 40 MG tablet dicyclomine (BENTYL) 20 MG tablet buPROPion (WELLBUTRIN SR) 150 MG 12 hr tablet lisinopril (PRINIVIL,ZESTRIL) 10 MG tablet omeprazole (PRILOSEC) 20 MG capsule

## 2021-11-19 NOTE — Telephone Encounter (Signed)
Pt wife is aware md out of office and cma will be back tomorrow

## 2021-11-19 NOTE — Telephone Encounter (Signed)
Patient's wife called to let [facility] know that in order to have temporary refill sent to [facility] patient will need a new prescription. I did let patient know that Dr.Hernandez was out of the office but wife wanted to know if someone else could send it in. Patient was supposed to have it sent through the mail but it is late and patient is having acid reflux problems.     Please send to  [id number] - [place], [place] Phone:  [phone]  Fax:  [phone]        Please advise

## 2021-11-20 ENCOUNTER — Other Ambulatory Visit: Payer: Self-pay | Admitting: *Deleted

## 2021-11-20 ENCOUNTER — Telehealth: Payer: Self-pay

## 2021-11-20 MED ORDER — OMEPRAZOLE 20 MG PO CPDR: 20.0000 mg | DELAYED_RELEASE_CAPSULE | Freq: Every day | ORAL | 0 refills | Status: DC

## 2021-11-20 MED ORDER — OMEPRAZOLE 20 MG PO CPDR: 20.0000 mg | DELAYED_RELEASE_CAPSULE | Freq: Every day | ORAL | 1 refills | Status: DC

## 2021-11-20 MED ORDER — LISINOPRIL 10 MG PO TABS: 10.0000 mg | ORAL_TABLET | Freq: Every day | ORAL | 1 refills | Status: DC

## 2021-11-20 MED ORDER — BUPROPION HCL ER (SR) 150 MG PO TB12: 150.0000 mg | ORAL_TABLET | Freq: Every morning | ORAL | 1 refills | Status: DC

## 2021-11-20 MED ORDER — DICYCLOMINE HCL 20 MG PO TABS
20.0000 mg | ORAL_TABLET | Freq: Four times a day (QID) | ORAL | 1 refills | Status: DC
Start: 2021-11-20 — End: 2021-12-14

## 2021-11-20 MED ORDER — ATORVASTATIN CALCIUM 40 MG PO TABS
40.0000 mg | ORAL_TABLET | Freq: Every day | ORAL | 1 refills | Status: DC
Start: 2021-11-20 — End: 2022-01-10

## 2021-11-20 NOTE — Telephone Encounter (Signed)
Refills sent

## 2021-11-20 NOTE — Telephone Encounter (Signed)
Wife of patient called back to check on the status of refills wife stated patient will be out by the in of the week

## 2021-11-26 ENCOUNTER — Telehealth: Payer: Self-pay

## 2021-11-26 NOTE — Telephone Encounter (Addendum)
Caller states her husband tested positive for Covid today. Caller states he has been laying down today, Tylenol and Robutussin given for fever of 99.4, cough, and headache. --caller states husband tested positive covid today, fever cough and general tired, noticed symptoms monday afternoon  11/21/2021 10:25:05 PM Call PCP within 24 Hours Ooton, RN, [place] Understands Yes  Comments User: Malachy Mood, RN Date/Time (Eastern Time): 11/21/2021 10:25:03 PM caller states doesn't feel like pt needs to been seen and will call pcp friday am and call us back if sx become worse  Referrals REFERRED TO PCP OFFICE  11/26/21 1005: Pt noted to have appt scheduled with Burchette on 11/27/21

## 2021-11-26 NOTE — Telephone Encounter (Signed)
*  CC: No change in Sx*Caller states her husband is COVID positive with Sx of cough, congestion. --Caller states her husband is COVID positive with Sx of cough, congestion. Symptoms since Monday afternoon. Had fever, up to 100.1, none now.  11/23/2021 11:33:55 AM Call PCP within 24 Hours Bell, RN, [place] Understands Yes  Comments User: Leodis Sias, RN Date/Time (Eastern Time): 11/23/2021 11:35:10 AM Patient advised to seek care at [facility] due to concerns COVID test was incorrect and he may have something else vs COVID. Adivsed caller to proceed to be evaluated to get confirmation test and treatment based on eval.  Referrals [facility] [place]  11/26/21 1035: Pt noted to have appt with Burchette on 11/27/21

## 2021-11-27 ENCOUNTER — Ambulatory Visit (INDEPENDENT_AMBULATORY_CARE_PROVIDER_SITE_OTHER): Payer: Medicare Other | Admitting: Family Medicine

## 2021-11-27 VITALS — BP 138/72 | HR 75 | Temp 98.4°F | Wt 145.0 lb

## 2021-11-27 DIAGNOSIS — J019 Acute sinusitis, unspecified: Secondary | ICD-10-CM

## 2021-11-27 DIAGNOSIS — U071 COVID-19: Secondary | ICD-10-CM

## 2021-11-27 MED ORDER — AMOXICILLIN-POT CLAVULANATE 875-125 MG PO TABS: 1.0000 | ORAL_TABLET | Freq: Two times a day (BID) | ORAL | 0 refills | Status: DC

## 2021-11-27 NOTE — Progress Notes (Signed)
Established Patient Office Visit  Subjective:  Patient ID: Joseph Mejia, male    DOB: Aug 16, 1946  Age: 75 y.o. MRN: [id number]  CC:  Chief Complaint  Patient presents with   Sinus Problem    Sinus congestion, since covid last week, coughing up stuff.    Back Pain    Possible muscle pull     HPI Joseph Mejia presents for persistent chest congestion and sinus congestion after recent diagnosis of COVID.  He states he had onset of symptoms about 8 days ago.  Tested positive for COVID on Wednesday.  He initially had some fever for about 3 days but none since then.  He folic he was making some recovery and then couple days ago started having increasing sinus congestion with some colored and occasional bloody nasal discharge.  Increased malaise since then.  He does have history of asthma and has rescue inhaler.  Takes Flovent regularly.  Does not feel particularly tight with his breathing and no significant dyspnea.  Past Medical History:  Diagnosis Date   ADD 02/23/2008   ANXIETY 08/03/2007   ASTHMA 08/03/2007   Asthma    Cancer ([place])    skin cancer    CHEST PAIN 11/17/2008   Closed fracture of four ribs 06/30/2008   DEPRESSION 08/03/2007   ERECTILE DYSFUNCTION, ORGANIC 02/23/2008   Failed moderate sedation during procedure 11/02/2008   GERD 08/03/2007   HAND PAIN, LEFT 06/20/2009   Heart murmur    HIP PAIN, RIGHT 05/27/2008   HYPERLIPIDEMIA 08/03/2007   HYPERTENSION 08/03/2007   Irritable bowel syndrome 11/02/2008   OSTEOARTHRITIS 11/02/2008   OSTEOPENIA 07/26/2008   RENAL DISEASE, CHRONIC, MILD 11/04/2008   UNIVERSAL ULCERATIVE COLITIS 01/26/2009    Past Surgical History:  Procedure Laterality Date   ANTERIOR FUSION CERVICAL SPINE  2012   BASAL CELL CARCINOMA EXCISION Left    ear, head   CARPAL TUNNEL RELEASE  2012   CERVICAL FUSION  May 2016   C7   COLONOSCOPY W/ BIOPSIES  06/2001, 08/2006, 07/2008   colon polyp, ulcerative colitis, lymphoid aggregates   ELBOW SURGERY   01/2002   left   HAND SURGERY  02/2010   basal thumb joint removal and tendon transplant([facility])   INCISION AND DRAINAGE [place]   SHOULDER SURGERY  09/2007   injury at work, dislocated   UPPER GASTROINTESTINAL ENDOSCOPY  06/2001   2cm hiatal hernia    Family History  Problem Relation Age of Onset   Heart disease Mother        s/p CABG age 49   Cancer Father        colon , brain   Colon cancer Father 51   COPD Sister    Heart disease Brother        CAD   Seizures Brother 54   Colon polyps Neg Hx    Esophageal cancer Neg Hx    Rectal cancer Neg Hx    Stomach cancer Neg Hx     Social History   Socioeconomic History   Marital status: Married    Spouse name: Not on file   Number of children: Not on file   Years of education: Not on file   Highest education level: Not on file  Occupational History   Not on file  Tobacco Use   Smoking status: Never   Smokeless tobacco: Never  Vaping Use   Vaping Use: Never used  Substance and  Sexual Activity   Alcohol use: Yes    Alcohol/week: 1.0 standard drink    Types: 1 Cans of beer per week   Drug use: No   Sexual activity: Not on file  Other Topics Concern   Not on file  Social History Narrative   Not on file   Social Determinants of Health   Financial Resource Strain: Not on file  Food Insecurity: Not on file  Transportation Needs: Not on file  Physical Activity: Not on file  Stress: Not on file  Social Connections: Not on file  Intimate Partner Violence: Not on file    Outpatient Medications Prior to Visit  Medication Sig Dispense Refill   ALPRAZolam (XANAX) 0.25 MG tablet Take 1 tablet (0.25 mg total) by mouth at bedtime as needed for anxiety. Take half tab daily as needed 30 tablet 1   amphetamine-dextroamphetamine (ADDERALL) 5 MG tablet Take 1 tablet (5 mg total) by mouth daily. 30 tablet 0   amphetamine-dextroamphetamine (ADDERALL) 5 MG tablet Take 1 tablet (5 mg  total) by mouth daily. 30 tablet 0   amphetamine-dextroamphetamine (ADDERALL) 5 MG tablet Take 1 tablet (5 mg total) by mouth daily. 30 tablet 0   atorvastatin (LIPITOR) 40 MG tablet Take 1 tablet (40 mg total) by mouth daily. 90 tablet 1   buPROPion (WELLBUTRIN SR) 150 MG 12 hr tablet Take 1 tablet (150 mg total) by mouth every morning. 90 tablet 1   Cyanocobalamin (VITAMIN B 12 PO) Take 5,000 mcg by mouth daily.     dicyclomine (BENTYL) 20 MG tablet Take 1 tablet (20 mg total) by mouth every 6 (six) hours. 60 tablet 1   diphenhydrAMINE (BENADRYL) 25 MG tablet Take 25 mg by mouth as needed.     fexofenadine (ALLEGRA) 180 MG tablet Take 180 mg by mouth daily as needed.     fluticasone (FLOVENT HFA) 110 MCG/ACT inhaler Inhale 1 puff into the lungs 2 (two) times daily. 1 each 12   lisinopril (ZESTRIL) 10 MG tablet Take 1 tablet (10 mg total) by mouth daily. 90 tablet 1   omeprazole (PRILOSEC) 20 MG capsule Take 1 capsule (20 mg total) by mouth daily. 90 capsule 1   PROAIR HFA 108 (90 Base) MCG/ACT inhaler INHALE 2 PUFFS INTO THE LUNGS 4 TIMES DAILY 25.5 each 2   Facility-Administered Medications Prior to Visit  Medication Dose Route Frequency Provider Last Rate Last Admin   0.9 %  sodium chloride infusion  500 mL Intravenous Once Gatha Mayer, MD        Allergies  Allergen Reactions   Novocain [Procaine] Palpitations    Novocain    Other Other (See Comments)    SKIN TEARS   Promethazine Other (See Comments)   Aspirin     REACTION: nose bleeds    Latex Other (See Comments)    Tears skin   Mercaptopurine     REACTION: myalgias   Procaine Hcl     REACTION: MAKES HEART RACE    ROS Review of Systems  Constitutional:  Positive for fatigue. Negative for chills and fever.  HENT:  Positive for sinus pressure and sinus pain. Negative for ear pain.   Respiratory:  Positive for cough. Negative for shortness of breath.   Cardiovascular:  Negative for chest pain.     Objective:     Physical Exam Vitals reviewed.  Constitutional:      Appearance: Normal appearance. He is not ill-appearing or toxic-appearing.  HENT:     Right  Ear: Tympanic membrane normal.     Left Ear: Tympanic membrane normal.  Cardiovascular:     Rate and Rhythm: Normal rate.  Pulmonary:     Effort: Pulmonary effort is normal.     Breath sounds: Normal breath sounds. No wheezing or rales.  Musculoskeletal:     Cervical back: Neck supple.  Lymphadenopathy:     Cervical: No cervical adenopathy.  Neurological:     Mental Status: He is alert.    BP 138/72 (BP Location: Left Arm, Patient Position: Sitting, Cuff Size: Normal)   Pulse 75   Temp 98.4 F (36.9 C) (Oral)   Wt 145 lb (65.8 kg)   SpO2 94%   BMI 24.13 kg/m  Wt Readings from Last 3 Encounters:  11/27/21 145 lb (65.8 kg)  09/12/21 146 lb 8 oz (66.5 kg)  03/02/21 142 lb 6.4 oz (64.6 kg)     Health Maintenance Due  Topic Date Due   COVID-19 Vaccine (4 - Booster for Moderna series) 01/10/2021    There are no preventive care reminders to display for this patient.  Lab Results  Component Value Date   TSH 2.50 03/02/2021   Lab Results  Component Value Date   WBC 6.1 03/02/2021   HGB 13.8 03/02/2021   HCT 39.9 03/02/2021   MCV 91.1 03/02/2021   PLT 178.0 03/02/2021   Lab Results  Component Value Date   NA 141 03/02/2021   K 4.5 03/02/2021   CO2 30 03/02/2021   GLUCOSE 93 03/02/2021   BUN 25 (H) 03/02/2021   CREATININE 1.73 (H) 03/02/2021   BILITOT 0.5 03/02/2021   ALKPHOS 82 03/02/2021   AST 24 03/02/2021   ALT 25 03/02/2021   PROT 6.3 03/02/2021   ALBUMIN 3.9 03/02/2021   CALCIUM 9.3 03/02/2021   GFR 38.43 (L) 03/02/2021   Lab Results  Component Value Date   CHOL 157 03/02/2021   Lab Results  Component Value Date   HDL 43.10 03/02/2021   Lab Results  Component Value Date   LDLCALC 93 03/02/2021   Lab Results  Component Value Date   TRIG 109.0 03/02/2021   Lab Results  Component Value Date    CHOLHDL 4 03/02/2021   Lab Results  Component Value Date   HGBA1C 5.5 03/02/2021      Assessment & Plan:   Problem List Items Addressed This Visit   None Visit Diagnoses     COVID-19    -  Primary   Acute non-recurrent sinusitis, unspecified location       Relevant Medications   amoxicillin-clavulanate (AUGMENTIN) 875-125 MG tablet     Patient had recent COVID-19 infection.  He was feeling better until the weekend and is felt worse since then with some bloody and purulent nasal secretions.  -We elected to go ahead and start Augmentin 875 mg twice daily with food for 10 days -He is past 5-day window for antiviral drugs for COVID.  Suspect he probably has secondary sinusitis.  Follow-up for any persistent or worsening symptoms  Meds ordered this encounter  Medications   amoxicillin-clavulanate (AUGMENTIN) 875-125 MG tablet    Sig: Take 1 tablet by mouth 2 (two) times daily.    Dispense:  20 tablet    Refill:  0    Follow-up: No follow-ups on file.    Carolann Littler, MD

## 2021-11-28 ENCOUNTER — Telehealth: Payer: Self-pay

## 2021-11-28 NOTE — Telephone Encounter (Signed)
Patient has been told by Dr Candiss Norse (nephrology) that his potassium is elevated.  Patient's wife has called their office also.  The would like to know if it is safe to take Augmentin?

## 2021-11-28 NOTE — Telephone Encounter (Signed)
Wife of patient called asking for a call back to discuss the Rx that was prescribed to patient the wife stated patient has potasium issues and is afraid th Rx may not be right for patient  amoxicillin-clavulanate (AUGMENTIN) 875-125 MG tablet

## 2021-11-29 NOTE — Telephone Encounter (Signed)
Patient is aware 

## 2021-12-04 ENCOUNTER — Ambulatory Visit
Admission: RE | Admit: 2021-12-04 | Discharge: 2021-12-04 | Disposition: A | Discharge status: Home / Self Care | Payer: Medicare Other | Source: Ambulatory Visit | Attending: Nephrology | Admitting: Nephrology

## 2021-12-04 DIAGNOSIS — N1832 Chronic kidney disease, stage 3b: Secondary | ICD-10-CM

## 2021-12-04 DIAGNOSIS — N189 Chronic kidney disease, unspecified: Secondary | ICD-10-CM | POA: Diagnosis not present

## 2021-12-04 DIAGNOSIS — N261 Atrophy of kidney (terminal): Secondary | ICD-10-CM | POA: Diagnosis not present

## 2021-12-04 DIAGNOSIS — N281 Cyst of kidney, acquired: Secondary | ICD-10-CM | POA: Diagnosis not present

## 2021-12-11 DIAGNOSIS — N1832 Chronic kidney disease, stage 3b: Secondary | ICD-10-CM | POA: Diagnosis not present

## 2021-12-14 ENCOUNTER — Other Ambulatory Visit: Payer: Self-pay

## 2021-12-14 DIAGNOSIS — F900 Attention-deficit hyperactivity disorder, predominantly inattentive type: Secondary | ICD-10-CM

## 2021-12-14 MED ORDER — AMPHETAMINE-DEXTROAMPHETAMINE 5 MG PO TABS: 5.0000 mg | ORAL_TABLET | Freq: Every day | ORAL | 0 refills | Status: DC

## 2021-12-14 MED ORDER — DICYCLOMINE HCL 20 MG PO TABS: 20.0000 mg | ORAL_TABLET | Freq: Four times a day (QID) | ORAL | 1 refills | Status: DC

## 2021-12-14 MED ORDER — BUPROPION HCL ER (XL) 300 MG PO TB24: 300.0000 mg | ORAL_TABLET | Freq: Every day | ORAL | 1 refills | Status: DC

## 2021-12-14 NOTE — Addendum Note (Signed)
Addended by: Westley Hummer B on: 12/14/2021 01:04 PM   Modules accepted: Orders

## 2021-12-14 NOTE — Telephone Encounter (Addendum)
Patient called asking that Rx buPROPion (WELLBUTRIN SR) 150 MG 12 hr tablet Be increased to 300 MG Send to [facility]   And requesting Rx refill  dicyclomine (BENTYL) 20 MG tablet amphetamine-dextroamphetamine (ADDERALL) 5 MG tablet Send to [facility]

## 2021-12-17 ENCOUNTER — Other Ambulatory Visit: Payer: Self-pay | Admitting: Internal Medicine

## 2021-12-17 NOTE — Telephone Encounter (Signed)
Patient is calling due to [facility] sending him a message advising him to put in a request for a refill. The refill is for dicyclomine (BENTYL) 20 MG tablet [[id number]] .   Patient could be contacted at [phone].  Please advise.

## 2021-12-19 ENCOUNTER — Other Ambulatory Visit: Payer: Self-pay | Admitting: *Deleted

## 2021-12-19 MED ORDER — DICYCLOMINE HCL 20 MG PO TABS
20.0000 mg | ORAL_TABLET | Freq: Four times a day (QID) | ORAL | 1 refills | Status: DC
Start: 2021-12-19 — End: 2022-04-23

## 2021-12-19 NOTE — Telephone Encounter (Signed)
Refill sent.

## 2022-01-10 ENCOUNTER — Other Ambulatory Visit: Payer: Self-pay

## 2022-01-10 MED ORDER — ATORVASTATIN CALCIUM 40 MG PO TABS: 40.0000 mg | ORAL_TABLET | Freq: Every day | ORAL | 1 refills | Status: DC

## 2022-01-14 ENCOUNTER — Ambulatory Visit: Payer: Medicare Other | Admitting: Urology

## 2022-01-24 ENCOUNTER — Other Ambulatory Visit: Payer: Self-pay | Admitting: Internal Medicine

## 2022-01-24 DIAGNOSIS — F411 Generalized anxiety disorder: Secondary | ICD-10-CM

## 2022-01-25 ENCOUNTER — Telehealth: Payer: Self-pay | Admitting: Internal Medicine

## 2022-01-25 NOTE — Telephone Encounter (Signed)
Last refill per controlled substance database: 11/15/21 Last OV: 09/12/21 Next OV: none scheduled

## 2022-01-25 NOTE — Telephone Encounter (Signed)
Patient is requesting a refill for ALPRAZolam (XANAX) 0.25 MG tablet [[id number]] to be sent to his pharmacy. He stated that he has been waiting on this since 01/26. Informed patient that provider isn't in office and was unsatisfied.  Patient could be contacted at [phone].  Please advise.

## 2022-01-25 NOTE — Telephone Encounter (Signed)
Request from pharmacy was sent electronically on 01/24/22. Per guidelines, all refills will be filled in at least 3 business days. Request was sent to a different provider in office.

## 2022-02-06 DIAGNOSIS — L570 Actinic keratosis: Secondary | ICD-10-CM | POA: Diagnosis not present

## 2022-02-06 DIAGNOSIS — L578 Other skin changes due to chronic exposure to nonionizing radiation: Secondary | ICD-10-CM | POA: Diagnosis not present

## 2022-02-11 DIAGNOSIS — R3912 Poor urinary stream: Secondary | ICD-10-CM | POA: Diagnosis not present

## 2022-02-13 LAB — PSA: PSA: 0.74

## 2022-03-28 ENCOUNTER — Telehealth: Payer: Self-pay | Admitting: Internal Medicine

## 2022-03-28 ENCOUNTER — Telehealth (INDEPENDENT_AMBULATORY_CARE_PROVIDER_SITE_OTHER): Payer: Medicare Other | Admitting: Internal Medicine

## 2022-03-28 ENCOUNTER — Encounter: Payer: Self-pay | Admitting: Internal Medicine

## 2022-03-28 VITALS — Wt 145.0 lb

## 2022-03-28 DIAGNOSIS — F900 Attention-deficit hyperactivity disorder, predominantly inattentive type: Secondary | ICD-10-CM | POA: Diagnosis not present

## 2022-03-28 MED ORDER — AMPHETAMINE-DEXTROAMPHETAMINE 5 MG PO TABS: 5.0000 mg | ORAL_TABLET | Freq: Every day | ORAL | 0 refills | Status: DC

## 2022-03-28 NOTE — Telephone Encounter (Signed)
VV scheduled.

## 2022-03-28 NOTE — Telephone Encounter (Signed)
Pt is calling and would like a refill on amphetamine-dextroamphetamine (ADDERALL) 5 MG tablet  ?[id number] - [place], [place] Phone:  [phone]  ?Fax:  [phone]  ?  ? ?

## 2022-03-28 NOTE — Progress Notes (Signed)
? ? ?Virtual Visit via Video Note ? ?I connected with Joseph Mejia on 03/28/22 at  3:30 PM EDT by a video enabled telemedicine application and verified that I am speaking with the correct person using two identifiers. ? ?Location patient: home ?Location provider: work office ?Persons participating in the virtual visit: patient, provider ? ?I discussed the limitations of evaluation and management by telemedicine and the availability of in person appointments. The patient expressed understanding and agreed to proceed. ? ? ?HPI: ?This visit has been scheduled for the purpose of Adderall refills per protocol.  He takes 5 mg daily.  He does not take it every single day so his prescription usually stretches out 6 months instead of 3.  He has been dealing with a lot of seasonal allergies and has been taking Xyzal and Benadryl as needed. ? ? ?ROS: ?Constitutional: Denies fever, chills, diaphoresis, appetite change and fatigue.  ?HEENT: Denies photophobia, eye pain, redness, hearing loss, mouth sores, trouble swallowing, neck pain, neck stiffness and tinnitus.   ?Respiratory: Denies SOB, DOE, chest tightness,  and wheezing.   ?Cardiovascular: Denies chest pain, palpitations and leg swelling.  ?Gastrointestinal: Denies nausea, vomiting, abdominal pain, diarrhea, constipation, blood in stool and abdominal distention.  ?Genitourinary: Denies dysuria, urgency, frequency, hematuria, flank pain and difficulty urinating.  ?Endocrine: Denies: hot or cold intolerance, sweats, changes in hair or nails, polyuria, polydipsia. ?Musculoskeletal: Denies myalgias, back pain, joint swelling, arthralgias and gait problem.  ?Skin: Denies pallor, rash and wound.  ?Neurological: Denies dizziness, seizures, syncope, weakness, light-headedness, numbness and headaches.  ?Hematological: Denies adenopathy. Easy bruising, personal or family bleeding history  ?Psychiatric/Behavioral: Denies suicidal ideation, mood changes, confusion,  nervousness, sleep disturbance and agitation ? ? ?Past Medical History:  ?Diagnosis Date  ? ADD 02/23/2008  ? ANXIETY 08/03/2007  ? ASTHMA 08/03/2007  ? Asthma   ? Cancer [facility]   ? skin cancer   ? CHEST PAIN 11/17/2008  ? Closed fracture of four ribs 06/30/2008  ? DEPRESSION 08/03/2007  ? ERECTILE DYSFUNCTION, ORGANIC 02/23/2008  ? Failed moderate sedation during procedure 11/02/2008  ? GERD 08/03/2007  ? HAND PAIN, LEFT 06/20/2009  ? Heart murmur   ? HIP PAIN, RIGHT 05/27/2008  ? HYPERLIPIDEMIA 08/03/2007  ? HYPERTENSION 08/03/2007  ? Irritable bowel syndrome 11/02/2008  ? OSTEOARTHRITIS 11/02/2008  ? OSTEOPENIA 07/26/2008  ? RENAL DISEASE, CHRONIC, MILD 11/04/2008  ? UNIVERSAL ULCERATIVE COLITIS 01/26/2009  ? ? ?Past Surgical History:  ?Procedure Laterality Date  ? ANTERIOR FUSION CERVICAL SPINE  2012  ? BASAL CELL CARCINOMA EXCISION Left   ? ear, head  ? CARPAL TUNNEL RELEASE  2012  ? CERVICAL FUSION  May 2016  ? C7  ? COLONOSCOPY W/ BIOPSIES  06/2001, 08/2006, 07/2008  ? colon polyp, ulcerative colitis, lymphoid aggregates  ? ELBOW SURGERY  01/2002  ? left  ? HAND SURGERY  02/2010  ? basal thumb joint removal and tendon transplant([facility])  ? INCISION AND DRAINAGE PERIRECTAL ABSCESS  1998  ? [place] SURGERY  1992  ? SHOULDER SURGERY  09/2007  ? injury at work, dislocated  ? UPPER GASTROINTESTINAL ENDOSCOPY  06/2001  ? 2cm hiatal hernia  ? ? ?Family History  ?Problem Relation Age of Onset  ? Heart disease Mother   ?     s/p CABG age 92  ? Cancer Father   ?     colon , brain  ? Colon cancer Father 36  ? COPD Sister   ? Heart disease Brother   ?  CAD  ? Seizures Brother 75  ? Colon polyps Neg Hx   ? Esophageal cancer Neg Hx   ? Rectal cancer Neg Hx   ? Stomach cancer Neg Hx   ? ? ?SOCIAL HX:  ? reports that he has never smoked. He has never used smokeless tobacco. He reports current alcohol use of about 1.0 standard drink per week. He reports that he does not use drugs. ? ? ?Current Outpatient Medications:  ?  ALPRAZolam (XANAX) 0.25 MG  tablet, TAKE ONE TABLET BY MOUTH AT BEDTIME AS NEEDED FOR ANXIETY AND TAKE 1/2 TABLET BY MOUTH DAILY IF NEEDED, Disp: 30 tablet, Rfl: 1 ?  atorvastatin (LIPITOR) 40 MG tablet, Take 1 tablet (40 mg total) by mouth daily., Disp: 90 tablet, Rfl: 1 ?  buPROPion (WELLBUTRIN XL) 300 MG 24 hr tablet, Take 1 tablet (300 mg total) by mouth daily., Disp: 90 tablet, Rfl: 1 ?  Cyanocobalamin (VITAMIN B 12 PO), Take 5,000 mcg by mouth daily., Disp: , Rfl:  ?  dicyclomine (BENTYL) 20 MG tablet, Take 1 tablet (20 mg total) by mouth every 6 (six) hours., Disp: 180 tablet, Rfl: 1 ?  diphenhydrAMINE (BENADRYL) 25 MG tablet, Take 25 mg by mouth as needed., Disp: , Rfl:  ?  fexofenadine (ALLEGRA) 180 MG tablet, Take 180 mg by mouth daily as needed., Disp: , Rfl:  ?  fluticasone (FLOVENT HFA) 110 MCG/ACT inhaler, Inhale 1 puff into the lungs 2 (two) times daily., Disp: 1 each, Rfl: 12 ?  lisinopril (ZESTRIL) 10 MG tablet, Take 1 tablet (10 mg total) by mouth daily., Disp: 90 tablet, Rfl: 1 ?  omeprazole (PRILOSEC) 20 MG capsule, Take 1 capsule (20 mg total) by mouth daily., Disp: 90 capsule, Rfl: 1 ?  PROAIR HFA 108 (90 Base) MCG/ACT inhaler, INHALE 2 PUFFS INTO THE LUNGS 4 TIMES DAILY, Disp: 25.5 each, Rfl: 2 ?  amphetamine-dextroamphetamine (ADDERALL) 5 MG tablet, Take 1 tablet (5 mg total) by mouth daily., Disp: 30 tablet, Rfl: 0 ?  amphetamine-dextroamphetamine (ADDERALL) 5 MG tablet, Take 1 tablet (5 mg total) by mouth daily., Disp: 30 tablet, Rfl: 0 ?  amphetamine-dextroamphetamine (ADDERALL) 5 MG tablet, Take 1 tablet (5 mg total) by mouth daily., Disp: 30 tablet, Rfl: 0 ? ?Current Facility-Administered Medications:  ?  0.9 %  sodium chloride infusion, 500 mL, Intravenous, Once, Gatha Mayer, MD ? ?EXAM:  ? ?VITALS per patient if applicable: None reported ? ?GENERAL: alert, oriented, appears well and in no acute distress ? ?HEENT: atraumatic, conjunttiva clear, no obvious abnormalities on inspection of external nose and  ears ? ?NECK: normal movements of the head and neck ? ?LUNGS: on inspection no signs of respiratory distress, breathing rate appears normal, no obvious gross increased work of breathing, gasping or wheezing ? ?CV: no obvious cyanosis ? ?MS: moves all visible extremities without noticeable abnormality ? ?PSYCH/NEURO: pleasant and cooperative, no obvious depression or anxiety, speech and thought processing grossly intact ? ?ASSESSMENT AND PLAN: ? ? ?Attention deficit hyperactivity disorder (ADHD), predominantly inattentive type ?-PDMP reviewed, no red flags, overdose risk score is 20. ?-Refill Adderall XR 5 mg to take 1 tablet daily for total of 30 tablets a month x3 months. ? ?  ?I discussed the assessment and treatment plan with the patient. The patient was provided an opportunity to ask questions and all were answered. The patient agreed with the plan and demonstrated an understanding of the instructions. ?  ?The patient was advised to call back or seek  an in-person evaluation if the symptoms worsen or if the condition fails to improve as anticipated. ? ? ? ?Lelon Frohlich, MD  ?[facility] ? ?

## 2022-04-18 ENCOUNTER — Other Ambulatory Visit: Payer: Self-pay | Admitting: Internal Medicine

## 2022-04-18 ENCOUNTER — Telehealth: Payer: Self-pay | Admitting: Internal Medicine

## 2022-04-18 NOTE — Telephone Encounter (Signed)
Pt called requesting his prescription for dicyclomine (BENTYL) 20 MG tablet be sent to [facility] ([place], [place]  [phone]  ?

## 2022-04-23 ENCOUNTER — Other Ambulatory Visit: Payer: Self-pay | Admitting: *Deleted

## 2022-04-23 MED ORDER — DICYCLOMINE HCL 20 MG PO TABS: 20.0000 mg | ORAL_TABLET | Freq: Four times a day (QID) | ORAL | 1 refills | Status: DC

## 2022-04-24 ENCOUNTER — Encounter: Payer: Self-pay | Admitting: Internal Medicine

## 2022-04-24 ENCOUNTER — Other Ambulatory Visit: Payer: Self-pay | Admitting: Internal Medicine

## 2022-04-24 ENCOUNTER — Ambulatory Visit (INDEPENDENT_AMBULATORY_CARE_PROVIDER_SITE_OTHER): Payer: Medicare Other | Admitting: Internal Medicine

## 2022-04-24 VITALS — BP 132/80 | HR 71 | Temp 97.9°F | Ht 64.5 in | Wt 147.9 lb

## 2022-04-24 DIAGNOSIS — E785 Hyperlipidemia, unspecified: Secondary | ICD-10-CM | POA: Diagnosis not present

## 2022-04-24 DIAGNOSIS — I10 Essential (primary) hypertension: Secondary | ICD-10-CM

## 2022-04-24 DIAGNOSIS — N1831 Chronic kidney disease, stage 3a: Secondary | ICD-10-CM | POA: Diagnosis not present

## 2022-04-24 DIAGNOSIS — F900 Attention-deficit hyperactivity disorder, predominantly inattentive type: Secondary | ICD-10-CM

## 2022-04-24 DIAGNOSIS — Z0000 Encounter for general adult medical examination without abnormal findings: Secondary | ICD-10-CM | POA: Diagnosis not present

## 2022-04-24 DIAGNOSIS — F339 Major depressive disorder, recurrent, unspecified: Secondary | ICD-10-CM

## 2022-04-24 NOTE — Progress Notes (Signed)
? ? ? ?Established Patient Office Visit ? ? ? ? ?This visit occurred during the SARS-CoV-2 public health emergency.  Safety protocols were in place, including screening questions prior to the visit, additional usage of staff PPE, and extensive cleaning of exam room while observing appropriate contact time as indicated for disinfecting solutions.  ? ? ?CC/Reason for Visit: Annual preventive exam and subsequent Medicare wellness visit ? ?HPI: Joseph Mejia is a 76 y.o. male who is coming in today for the above mentioned reasons. Past Medical History is significant for: Hypertension, hyperlipidemia, GERD, asthma, ADHD, depression, insomnia.  He is feeling well and has no major concerns.  He was just diagnosed with osteoarthritis of the foot and is under the care of an orthopedist.  He has routine eye and dental care.  He is overdue for COVID booster and Tdap vaccines.  He had his last colonoscopy in 2020. ? ? ?Past Medical/Surgical History: ?Past Medical History:  ?Diagnosis Date  ? ADD 02/23/2008  ? ANXIETY 08/03/2007  ? ASTHMA 08/03/2007  ? Asthma   ? Cancer [facility])   ? skin cancer   ? CHEST PAIN 11/17/2008  ? Closed fracture of four ribs 06/30/2008  ? DEPRESSION 08/03/2007  ? ERECTILE DYSFUNCTION, ORGANIC 02/23/2008  ? Failed moderate sedation during procedure 11/02/2008  ? GERD 08/03/2007  ? HAND PAIN, LEFT 06/20/2009  ? Heart murmur   ? HIP PAIN, RIGHT 05/27/2008  ? HYPERLIPIDEMIA 08/03/2007  ? HYPERTENSION 08/03/2007  ? Irritable bowel syndrome 11/02/2008  ? OSTEOARTHRITIS 11/02/2008  ? OSTEOPENIA 07/26/2008  ? RENAL DISEASE, CHRONIC, MILD 11/04/2008  ? UNIVERSAL ULCERATIVE COLITIS 01/26/2009  ? ? ?Past Surgical History:  ?Procedure Laterality Date  ? ANTERIOR FUSION CERVICAL SPINE  2012  ? BASAL CELL CARCINOMA EXCISION Left   ? ear, head  ? CARPAL TUNNEL RELEASE  2012  ? CERVICAL FUSION  May 2016  ? C7  ? COLONOSCOPY W/ BIOPSIES  06/2001, 08/2006, 07/2008  ? colon polyp, ulcerative colitis, lymphoid aggregates  ? ELBOW SURGERY   01/2002  ? left  ? HAND SURGERY  02/2010  ? basal thumb joint removal and tendon transplant([facility])  ? INCISION AND DRAINAGE PERIRECTAL ABSCESS  1998  ? [place] SURGERY  1992  ? SHOULDER SURGERY  09/2007  ? injury at work, dislocated  ? UPPER GASTROINTESTINAL ENDOSCOPY  06/2001  ? 2cm hiatal hernia  ? ? ?Social History: ? reports that he has never smoked. He has never used smokeless tobacco. He reports current alcohol use of about 1.0 standard drink per week. He reports that he does not use drugs. ? ?Allergies: ?Allergies  ?Allergen Reactions  ? Novocain [Procaine] Palpitations  ?  Novocain   ? Other Other (See Comments)  ?  SKIN TEARS  ? Promethazine Other (See Comments)  ? Aspirin   ?  REACTION: nose bleeds   ? Latex Other (See Comments)  ?  Tears skin  ? Mercaptopurine   ?  REACTION: myalgias  ? Procaine Hcl   ?  REACTION: MAKES HEART RACE  ? ? ?Family History:  ?Family History  ?Problem Relation Age of Onset  ? Heart disease Mother   ?     s/p CABG age 65  ? Cancer Father   ?     colon , brain  ? Colon cancer Father 2  ? COPD Sister   ? Heart disease Brother   ?     CAD  ? Seizures Brother 17  ? Colon polyps Neg  Hx   ? Esophageal cancer Neg Hx   ? Rectal cancer Neg Hx   ? Stomach cancer Neg Hx   ? ? ? ?Current Outpatient Medications:  ?  ALPRAZolam (XANAX) 0.25 MG tablet, TAKE ONE TABLET BY MOUTH AT BEDTIME AS NEEDED FOR ANXIETY AND TAKE 1/2 TABLET BY MOUTH DAILY IF NEEDED, Disp: 30 tablet, Rfl: 1 ?  amphetamine-dextroamphetamine (ADDERALL) 5 MG tablet, Take 1 tablet (5 mg total) by mouth daily., Disp: 30 tablet, Rfl: 0 ?  amphetamine-dextroamphetamine (ADDERALL) 5 MG tablet, Take 1 tablet (5 mg total) by mouth daily., Disp: 30 tablet, Rfl: 0 ?  amphetamine-dextroamphetamine (ADDERALL) 5 MG tablet, Take 1 tablet (5 mg total) by mouth daily., Disp: 30 tablet, Rfl: 0 ?  atorvastatin (LIPITOR) 40 MG tablet, Take 1 tablet (40 mg total) by mouth daily., Disp: 90 tablet, Rfl: 1 ?  buPROPion (WELLBUTRIN XL) 300 MG 24  hr tablet, Take 1 tablet (300 mg total) by mouth daily., Disp: 90 tablet, Rfl: 1 ?  Cyanocobalamin (VITAMIN B 12 PO), Take 5,000 mcg by mouth daily., Disp: , Rfl:  ?  dicyclomine (BENTYL) 20 MG tablet, Take 1 tablet (20 mg total) by mouth every 6 (six) hours., Disp: 180 tablet, Rfl: 1 ?  diphenhydrAMINE (BENADRYL) 25 MG tablet, Take 25 mg by mouth as needed., Disp: , Rfl:  ?  fexofenadine (ALLEGRA) 180 MG tablet, Take 180 mg by mouth daily as needed., Disp: , Rfl:  ?  fluticasone (FLOVENT HFA) 110 MCG/ACT inhaler, Inhale 1 puff into the lungs 2 (two) times daily., Disp: 1 each, Rfl: 12 ?  lisinopril (ZESTRIL) 10 MG tablet, Take 1 tablet (10 mg total) by mouth daily., Disp: 30 tablet, Rfl: 0 ?  omeprazole (PRILOSEC) 20 MG capsule, TAKE 1 CAPSULE BY MOUTH DAILY, Disp: 90 capsule, Rfl: 1 ?  PROAIR HFA 108 (90 Base) MCG/ACT inhaler, INHALE 2 PUFFS INTO THE LUNGS 4 TIMES DAILY, Disp: 25.5 each, Rfl: 2 ? ?Current Facility-Administered Medications:  ?  0.9 %  sodium chloride infusion, 500 mL, Intravenous, Once, Gatha Mayer, MD ? ?Review of Systems:  ?Constitutional: Denies fever, chills, diaphoresis, appetite change and fatigue.  ?HEENT: Denies photophobia, eye pain, redness, hearing loss, ear pain, congestion, sore throat, rhinorrhea, sneezing, mouth sores, trouble swallowing, neck pain, neck stiffness and tinnitus.   ?Respiratory: Denies SOB, DOE, cough, chest tightness,  and wheezing.   ?Cardiovascular: Denies chest pain, palpitations and leg swelling.  ?Gastrointestinal: Denies nausea, vomiting, abdominal pain, diarrhea, constipation, blood in stool and abdominal distention.  ?Genitourinary: Denies dysuria, urgency, frequency, hematuria, flank pain and difficulty urinating.  ?Endocrine: Denies: hot or cold intolerance, sweats, changes in hair or nails, polyuria, polydipsia. ?Musculoskeletal: Denies myalgias, back pain, joint swelling, arthralgias and gait problem.  ?Skin: Denies pallor, rash and wound.   ?Neurological: Denies dizziness, seizures, syncope, weakness, light-headedness, numbness and headaches.  ?Hematological: Denies adenopathy. Easy bruising, personal or family bleeding history  ?Psychiatric/Behavioral: Denies suicidal ideation, mood changes, confusion, nervousness, sleep disturbance and agitation ? ? ? ?Physical Exam: ?Vitals:  ? 04/24/22 1504  ?BP: 132/80  ?Pulse: 71  ?Temp: 97.9 ?F (36.6 ?C)  ?TempSrc: Oral  ?SpO2: 93%  ?Weight: 147 lb 14.4 oz (67.1 kg)  ?Height: 5' 4.5" (1.638 m)  ? ? ?Body mass index is 24.99 kg/m?. ? ? ?Constitutional: NAD, calm, comfortable ?Eyes: PERRL, lids and conjunctivae normal, wears corrective lenses ?ENMT: Mucous membranes are moist. Posterior pharynx clear of any exudate or lesions. Normal dentition. Tympanic membrane is pearly white,  no erythema or bulging. ?Neck: normal, supple, no masses, no thyromegaly ?Respiratory: clear to auscultation bilaterally, no wheezing, no crackles. Normal respiratory effort. No accessory muscle use.  ?Cardiovascular: Regular rate and rhythm, no murmurs / rubs / gallops. No extremity edema. 2+ pedal pulses. No carotid bruits.  ?Abdomen: no tenderness, no masses palpated. No hepatosplenomegaly. Bowel sounds positive.  ?Musculoskeletal: no clubbing / cyanosis. No joint deformity upper and lower extremities. Good ROM, no contractures. Normal muscle tone.  ?Skin: no rashes, lesions, ulcers. No induration ?Neurologic: CN 2-12 grossly intact. Sensation intact, DTR normal. Strength 5/5 in all 4.  ?Psychiatric: Normal judgment and insight. Alert and oriented x 3. Normal mood.  ? ? ?Subsequent Medicare wellness visit ?  ?1. Risk factors, based on past  M,S,F -cardiovascular disease risk factors include age, gender, history of hypertension and hyperlipidemia ?  ?2.  Physical activities: Is not very active more than activities of daily living ?  ?3.  Depression/mood: History of depression but mood is stable ?  ?4.  Hearing: No perceived issues ?   ?5.  ADL's: Independent in all ADLs ?  ?6.  Fall risk: Low fall risk ?  ?7.  Home safety: No problems identified ?  ?8.  Height weight, and visual acuity: height and weight as above, vision: ? ?Vision Screening  ? R

## 2022-04-24 NOTE — Patient Instructions (Signed)
-  Nice seeing you today!! ? ?-Lab work today; will notify you once results are available. ? ?-Remember your COVID booster and tetanus vaccines. ? ?-Schedule follow up in 3 months or as needed. ? ? ?

## 2022-04-25 LAB — COMPREHENSIVE METABOLIC PANEL
ALT: 27 U/L (ref 0–53)
AST: 26 U/L (ref 0–37)
Albumin: 4 g/dL (ref 3.5–5.2)
Alkaline Phosphatase: 99 U/L (ref 39–117)
BUN: 25 mg/dL — ABNORMAL HIGH (ref 6–23)
CO2: 28 mEq/L (ref 19–32)
Calcium: 9.2 mg/dL (ref 8.4–10.5)
Chloride: 103 mEq/L (ref 96–112)
Creatinine, Ser: 1.9 mg/dL — ABNORMAL HIGH (ref 0.40–1.50)
GFR: 34.07 mL/min — ABNORMAL LOW (ref >60.00)
Glucose, Bld: 62 mg/dL — ABNORMAL LOW (ref 70–99)
Potassium: 4.3 mEq/L (ref 3.5–5.1)
Sodium: 139 mEq/L (ref 135–145)
Total Bilirubin: 0.5 mg/dL (ref 0.2–1.2)
Total Protein: 6.9 g/dL (ref 6.0–8.3)

## 2022-04-25 LAB — LIPID PANEL
Cholesterol: 166 mg/dL (ref 0–200)
HDL: 45.7 mg/dL (ref >39.00)
LDL Cholesterol: 100 mg/dL — ABNORMAL HIGH (ref 0–99)
NonHDL: 120.26
Total CHOL/HDL Ratio: 4
Triglycerides: 100 mg/dL (ref 0.0–149.0)
VLDL: 20 mg/dL (ref 0.0–40.0)

## 2022-04-25 LAB — CBC WITH DIFFERENTIAL/PLATELET
Basophils Absolute: 0.1 10*3/uL (ref 0.0–0.1)
Basophils Relative: 0.8 % (ref 0.0–3.0)
Eosinophils Absolute: 0.3 10*3/uL (ref 0.0–0.7)
Eosinophils Relative: 4 % (ref 0.0–5.0)
HCT: 41.5 % (ref 39.0–52.0)
Hemoglobin: 13.8 g/dL (ref 13.0–17.0)
Lymphocytes Relative: 40 % (ref 12.0–46.0)
Lymphs Abs: 3 10*3/uL (ref 0.7–4.0)
MCHC: 33.3 g/dL (ref 30.0–36.0)
MCV: 92.8 fl (ref 78.0–100.0)
Monocytes Absolute: 0.9 10*3/uL (ref 0.1–1.0)
Monocytes Relative: 12.1 % — ABNORMAL HIGH (ref 3.0–12.0)
Neutro Abs: 3.3 10*3/uL (ref 1.4–7.7)
Neutrophils Relative %: 43.1 % (ref 43.0–77.0)
Platelets: 192 10*3/uL (ref 150.0–400.0)
RBC: 4.47 Mil/uL (ref 4.22–5.81)
RDW: 13.1 % (ref 11.5–15.5)
WBC: 7.6 10*3/uL (ref 4.0–10.5)

## 2022-04-25 LAB — HEMOGLOBIN A1C: Hgb A1c MFr Bld: 5.5 % (ref 4.6–6.5)

## 2022-05-14 DIAGNOSIS — N1832 Chronic kidney disease, stage 3b: Secondary | ICD-10-CM | POA: Diagnosis not present

## 2022-05-20 DIAGNOSIS — D631 Anemia in chronic kidney disease: Secondary | ICD-10-CM | POA: Diagnosis not present

## 2022-05-20 DIAGNOSIS — I129 Hypertensive chronic kidney disease with stage 1 through stage 4 chronic kidney disease, or unspecified chronic kidney disease: Secondary | ICD-10-CM | POA: Diagnosis not present

## 2022-05-20 DIAGNOSIS — N1832 Chronic kidney disease, stage 3b: Secondary | ICD-10-CM | POA: Diagnosis not present

## 2022-05-20 DIAGNOSIS — M5451 Vertebrogenic low back pain: Secondary | ICD-10-CM | POA: Diagnosis not present

## 2022-05-20 DIAGNOSIS — N281 Cyst of kidney, acquired: Secondary | ICD-10-CM | POA: Diagnosis not present

## 2022-05-20 DIAGNOSIS — N2581 Secondary hyperparathyroidism of renal origin: Secondary | ICD-10-CM | POA: Diagnosis not present

## 2022-05-20 DIAGNOSIS — E875 Hyperkalemia: Secondary | ICD-10-CM | POA: Diagnosis not present

## 2022-05-22 ENCOUNTER — Other Ambulatory Visit: Payer: Self-pay | Admitting: Internal Medicine

## 2022-05-23 ENCOUNTER — Other Ambulatory Visit: Payer: Self-pay | Admitting: Internal Medicine

## 2022-05-23 DIAGNOSIS — F411 Generalized anxiety disorder: Secondary | ICD-10-CM

## 2022-05-23 MED ORDER — ALPRAZOLAM 0.25 MG PO TABS: ORAL_TABLET | ORAL | 1 refills | Status: DC

## 2022-05-23 NOTE — Telephone Encounter (Signed)
Pt is calling and needs a refill on ALPRAZolam (XANAX) 0.25 MG tabletALPRAZolam (XANAX) 0.25 MG tablet  [facility] [id number] - [place], [place] - [place] Phone:  [phone]  Fax:  [phone]

## 2022-05-28 ENCOUNTER — Other Ambulatory Visit: Payer: Self-pay | Admitting: Nephrology

## 2022-05-28 DIAGNOSIS — N281 Cyst of kidney, acquired: Secondary | ICD-10-CM

## 2022-06-03 DIAGNOSIS — M5416 Radiculopathy, lumbar region: Secondary | ICD-10-CM | POA: Diagnosis not present

## 2022-06-10 DIAGNOSIS — M5451 Vertebrogenic low back pain: Secondary | ICD-10-CM | POA: Diagnosis not present

## 2022-06-10 DIAGNOSIS — M5136 Other intervertebral disc degeneration, lumbar region: Secondary | ICD-10-CM | POA: Diagnosis not present

## 2022-06-20 DIAGNOSIS — M5416 Radiculopathy, lumbar region: Secondary | ICD-10-CM | POA: Diagnosis not present

## 2022-06-21 ENCOUNTER — Other Ambulatory Visit: Payer: Medicare Other

## 2022-06-26 ENCOUNTER — Ambulatory Visit: Payer: Self-pay | Admitting: Orthopedic Surgery

## 2022-06-26 ENCOUNTER — Other Ambulatory Visit: Payer: Self-pay | Admitting: Internal Medicine

## 2022-06-26 DIAGNOSIS — M5451 Vertebrogenic low back pain: Secondary | ICD-10-CM | POA: Diagnosis not present

## 2022-06-26 DIAGNOSIS — M48062 Spinal stenosis, lumbar region with neurogenic claudication: Secondary | ICD-10-CM

## 2022-06-28 ENCOUNTER — Ambulatory Visit: Payer: Self-pay | Admitting: Orthopedic Surgery

## 2022-06-28 NOTE — H&P (Signed)
Joseph Mejia is an 76 y.o. male.   Chief Complaint: back and left leg pain, weakness HPI: Reason for Visit: (normal) visit for: (back); 7 weeks Location (Lower Extremity): lower back pain bilateral; leg pain on the left, , ; left hip Severity: pain level 10/10 Timing: constant Aggravating Factors: standing for ; walking for ; lying down Alleviating Factors: sitting helps Associated Symptoms: weakness (LLE) Medications: Gabapentin 355m tid Notes: 06/20/22 T-F ESI L2-3 L No relief  Past Medical History:  Diagnosis Date   ADD 02/23/2008   ANXIETY 08/03/2007   ASTHMA 08/03/2007   Asthma    [place]    CHEST PAIN 11/17/2008   Closed fracture of four ribs 06/30/2008   DEPRESSION 08/03/2007   ERECTILE DYSFUNCTION, ORGANIC 02/23/2008   Failed moderate sedation during procedure 11/02/2008   GERD 08/03/2007   HAND PAIN, LEFT 06/20/2009   Heart murmur    HIP PAIN, RIGHT 05/27/2008   HYPERLIPIDEMIA 08/03/2007   HYPERTENSION 08/03/2007   Irritable bowel syndrome 11/02/2008   OSTEOARTHRITIS 11/02/2008   OSTEOPENIA 07/26/2008   RENAL DISEASE, CHRONIC, MILD 11/04/2008   UNIVERSAL ULCERATIVE COLITIS 01/26/2009    Past Surgical History:  Procedure Laterality Date   ANTERIOR FUSION CERVICAL SPINE  2012   BASAL CELL CARCINOMA EXCISION Left    ear, head   CARPAL TUNNEL RELEASE  2012   CERVICAL FUSION  May 2016   C7   COLONOSCOPY W/ BIOPSIES  06/2001, 08/2006, 07/2008   colon polyp, ulcerative colitis, lymphoid aggregates   ELBOW SURGERY  01/2002   left   HAND SURGERY  02/2010   basal thumb joint removal and tendon transplant([facility])   INCISION AND DRAINAGE [place]  SHOULDER SURGERY  09/2007   injury at work, dislocated   UPPER GASTROINTESTINAL ENDOSCOPY  06/2001   2cm hiatal hernia    Family History  Problem Relation Age of Onset   Heart disease Mother        s/p CABG age 76  Cancer Father        colon , brain   Colon cancer  Father [phone]  COPD Sister    Heart disease Brother        CAD   Seizures Brother 655  Colon polyps Neg Hx    Esophageal cancer Neg Hx    Rectal cancer Neg Hx    Stomach cancer Neg Hx    Social History:  reports that he has never smoked. He has never used smokeless tobacco. He reports current alcohol use of about 1.0 standard drink of alcohol per week. He reports that he does not use drugs.  Allergies:  Allergies  Allergen Reactions   Novocain [Procaine] Palpitations    Novocain    Other Other (See Comments)    SKIN TEARS   Promethazine Other (See Comments)   Aspirin     REACTION: nose bleeds    Latex Other (See Comments)    Tears skin   Mercaptopurine     REACTION: myalgias   Procaine Hcl     REACTION: MAKES HEART RACE   Current meds: ALPRAZolam 0.25 mg tablet amLODIPine 10 mg tablet atorvastatin 40 mg tablet buPROPion HCL 150 mg tablet,12 hr sustained-release(smoking deterrent) buPROPion HCL XL 300 mg 24 hr tablet, extended release Caltrate dextroamphetamine-amphetamine 5 mg tablet dicyclomine 20 mg tablet escitalopram 5 mg tablet Flovent HFA 110 mcg/actuation aerosol inhaler gabapentin 300 mg capsule  hyoscyamine ER 0.375 mg tablet,extended release,12 hr lisinopriL 10 mg tablet multivitamin omeprazole 20 mg capsule,delayed release ondansetron HCL 4 mg tablet oxyCODONE-acetaminophen 5 mg-325 mg tablet ProAir HFA 90 mcg/actuation aerosol inhaler Qvar 40 mcg/actuation Metered Aerosol oral inhaler Vitamin D zolpidem 10 mg tablet  Review of Systems  Constitutional: Negative.   HENT: Negative.    Eyes: Negative.   Respiratory: Negative.    Cardiovascular: Negative.   Endocrine: Negative.   Genitourinary: Negative.   Musculoskeletal:  Positive for back pain and gait problem.  Skin: Negative.   Neurological:  Positive for weakness and numbness.  Hematological: Negative.   Psychiatric/Behavioral: Negative.      There were no vitals taken for this  visit. Physical Exam Constitutional:      Appearance: Normal appearance.  HENT:     Head: Normocephalic and atraumatic.     Right Ear: External ear normal.     Left Ear: External ear normal.     Nose: Nose normal.     Mouth/Throat:     Pharynx: Oropharynx is clear.  Eyes:     Conjunctiva/sclera: Conjunctivae normal.  Cardiovascular:     Rate and Rhythm: Normal rate and regular rhythm.     Pulses: Normal pulses.     Heart sounds: Normal heart sounds.  Pulmonary:     Effort: Pulmonary effort is normal.     Breath sounds: Normal breath sounds.  Abdominal:     General: Bowel sounds are normal.  Musculoskeletal:     Cervical back: Normal range of motion.     Comments: Patient with a markedly positive femoral stretch. Absent knee reflex. Hip flexor and quadricep weakness on the left. No DVT  Skin:    General: Skin is warm and dry.  Neurological:     Mental Status: He is alert.    MRI demonstrates a disc herniation L2-3 migrating caudad effacement of the L3 L2 nerve root. With associated moderate stenosis. Grade 1 spinal listhesis L4-5 history of lumbar decompression no definitive neural compressive lesion.  Assessment/Plan Impression:  Patient with L3 radiculopathy secondary to disc herniation and associated spinal stenosis at L2-3 with myotomal weakness dermatomal dysesthesias refractory rest activity modification home stretches exercises and a epidural steroid injection  Plan:  2 options living with his symptoms versus proceeding with lumbar decompression centrally at L2-3 patient would like to proceed with the latter.  I had an extensive discussion with the patient concerning the pathology relevant anatomy and treatment options. At this point exhausting conservative treatment and in the presence of a neurologic deficit we discussed microlumbar decompression. I discussed the risks and benefits including bleeding, infection, DVT, PE, anesthetic complications, worsening in their  symptoms, improvement in their symptoms, C SF leakage, epidural fibrosis, need for future surgeries such as revision discectomy and lumbar fusion. I also indicated that this is an operation to basically decompress the nerve roots to allow recovery as opposed to fixing a herniated disc if it is encountered and that the incidence of recurrent chest disc herniation can approach 15%. Also that nerve root recovery is variable and may not recover completely. Any ligament or bone that is contributing to compressing the nerves will be removed as well.  I discussed the operative course including overnight in the hospital. Immediate ambulation. Follow-up in 2 weeks for suture removal. 6 weeks until healing of the herniation and surgical incision followed by 6 weeks of reconditioning and strengthening of the core musculature. Also discussed the need to employ the concepts of disc  pressure management and core motion following the surgery to minimize the risk of recurrent disc herniation. We will obtain preoperative clearance i if necessary and proceed accordingly.  Preoperative clearance. Capsule oxycodone. No Phenergan continue gabapentin  Plan central decompression, microdiscectomy L2-3  Cecilie Kicks, PA-C for Dr Tonita Cong 06/28/2022, 3:24 PM

## 2022-06-28 NOTE — H&P (View-Only) (Signed)
Joseph Mejia is an 76 y.o. male.   Chief Complaint: back and left leg pain, weakness HPI: Reason for Visit: (normal) visit for: (back); 7 weeks Location (Lower Extremity): lower back pain bilateral; leg pain on the left, , ; left hip Severity: pain level 10/10 Timing: constant Aggravating Factors: standing for ; walking for ; lying down Alleviating Factors: sitting helps Associated Symptoms: weakness (LLE) Medications: Gabapentin 325m tid Notes: 06/20/22 T-F ESI L2-3 L No relief  Past Medical History:  Diagnosis Date   ADD 02/23/2008   ANXIETY 08/03/2007   ASTHMA 08/03/2007   Asthma    [place]    [place]    CHEST PAIN 11/17/2008   Closed fracture of four ribs 06/30/2008   DEPRESSION 08/03/2007   ERECTILE DYSFUNCTION, ORGANIC 02/23/2008   Failed moderate sedation during procedure 11/02/2008   GERD 08/03/2007   HAND PAIN, LEFT 06/20/2009   Heart murmur    HIP PAIN, RIGHT 05/27/2008   HYPERLIPIDEMIA 08/03/2007   HYPERTENSION 08/03/2007   Irritable bowel syndrome 11/02/2008   OSTEOARTHRITIS 11/02/2008   OSTEOPENIA 07/26/2008   RENAL DISEASE, CHRONIC, MILD 11/04/2008   UNIVERSAL ULCERATIVE COLITIS 01/26/2009    Past Surgical History:  Procedure Laterality Date   ANTERIOR FUSION CERVICAL SPINE  2012   BASAL CELL CARCINOMA EXCISION Left    ear, head   CARPAL TUNNEL RELEASE  2012   CERVICAL FUSION  May 2016   C7   COLONOSCOPY W/ BIOPSIES  06/2001, 08/2006, 07/2008   colon polyp, ulcerative colitis, lymphoid aggregates   ELBOW SURGERY  01/2002   left   HAND SURGERY  02/2010   basal thumb joint removal and tendon transplant([facility])   [place]  SHOULDER SURGERY  09/2007   injury at work, dislocated   UPPER GASTROINTESTINAL ENDOSCOPY  06/2001   2cm hiatal hernia    Family History  Problem Relation Age of Onset   Heart disease Mother        s/p CABG age 76  Cancer Father        colon , brain   Colon cancer  Father 568  COPD Sister    Heart disease Brother        CAD   Seizures Brother 64  Colon polyps Neg Hx    Esophageal cancer Neg Hx    Rectal cancer Neg Hx    Stomach cancer Neg Hx    Social History:  reports that he has never smoked. He has never used smokeless tobacco. He reports current alcohol use of about 1.0 standard drink of alcohol per week. He reports that he does not use drugs.  Allergies:  Allergies  Allergen Reactions   Novocain [Procaine] Palpitations    Novocain    Other Other (See Comments)    SKIN TEARS   Promethazine Other (See Comments)   Aspirin     REACTION: nose bleeds    Latex Other (See Comments)    Tears skin   Mercaptopurine     REACTION: myalgias   Procaine Hcl     REACTION: MAKES HEART RACE   Current meds: ALPRAZolam 0.25 mg tablet amLODIPine 10 mg tablet atorvastatin 40 mg tablet buPROPion HCL 150 mg tablet,12 hr sustained-release(smoking deterrent) buPROPion HCL XL 300 mg 24 hr tablet, extended release Caltrate dextroamphetamine-amphetamine 5 mg tablet dicyclomine 20 mg tablet escitalopram 5 mg tablet Flovent HFA 110 mcg/actuation aerosol inhaler gabapentin 300 mg capsule  hyoscyamine ER 0.375 mg tablet,extended release,12 hr lisinopriL 10 mg tablet multivitamin omeprazole 20 mg capsule,delayed release ondansetron HCL 4 mg tablet oxyCODONE-acetaminophen 5 mg-325 mg tablet ProAir HFA 90 mcg/actuation aerosol inhaler Qvar 40 mcg/actuation Metered Aerosol oral inhaler Vitamin D zolpidem 10 mg tablet  Review of Systems  Constitutional: Negative.   HENT: Negative.    Eyes: Negative.   Respiratory: Negative.    Cardiovascular: Negative.   Endocrine: Negative.   Genitourinary: Negative.   Musculoskeletal:  Positive for back pain and gait problem.  Skin: Negative.   Neurological:  Positive for weakness and numbness.  Hematological: Negative.   Psychiatric/Behavioral: Negative.      There were no vitals taken for this  visit. Physical Exam Constitutional:      Appearance: Normal appearance.  HENT:     Head: Normocephalic and atraumatic.     Right Ear: External ear normal.     Left Ear: External ear normal.     Nose: Nose normal.     Mouth/Throat:     Pharynx: Oropharynx is clear.  Eyes:     Conjunctiva/sclera: Conjunctivae normal.  Cardiovascular:     Rate and Rhythm: Normal rate and regular rhythm.     Pulses: Normal pulses.     Heart sounds: Normal heart sounds.  Pulmonary:     Effort: Pulmonary effort is normal.     Breath sounds: Normal breath sounds.  Abdominal:     General: Bowel sounds are normal.  Musculoskeletal:     Cervical back: Normal range of motion.     Comments: Patient with a markedly positive femoral stretch. Absent knee reflex. Hip flexor and quadricep weakness on the left. No DVT  Skin:    General: Skin is warm and dry.  Neurological:     Mental Status: He is alert.    MRI demonstrates a disc herniation L2-3 migrating caudad effacement of the L3 L2 nerve root. With associated moderate stenosis. Grade 1 spinal listhesis L4-5 history of lumbar decompression no definitive neural compressive lesion.  Assessment/Plan Impression:  Patient with L3 radiculopathy secondary to disc herniation and associated spinal stenosis at L2-3 with myotomal weakness dermatomal dysesthesias refractory rest activity modification home stretches exercises and a epidural steroid injection  Plan:  2 options living with his symptoms versus proceeding with lumbar decompression centrally at L2-3 patient would like to proceed with the latter.  I had an extensive discussion with the patient concerning the pathology relevant anatomy and treatment options. At this point exhausting conservative treatment and in the presence of a neurologic deficit we discussed microlumbar decompression. I discussed the risks and benefits including bleeding, infection, DVT, PE, anesthetic complications, worsening in their  symptoms, improvement in their symptoms, C SF leakage, epidural fibrosis, need for future surgeries such as revision discectomy and lumbar fusion. I also indicated that this is an operation to basically decompress the nerve roots to allow recovery as opposed to fixing a herniated disc if it is encountered and that the incidence of recurrent chest disc herniation can approach 15%. Also that nerve root recovery is variable and may not recover completely. Any ligament or bone that is contributing to compressing the nerves will be removed as well.  I discussed the operative course including overnight in the hospital. Immediate ambulation. Follow-up in 2 weeks for suture removal. 6 weeks until healing of the herniation and surgical incision followed by 6 weeks of reconditioning and strengthening of the core musculature. Also discussed the need to employ the concepts of disc  pressure management and core motion following the surgery to minimize the risk of recurrent disc herniation. We will obtain preoperative clearance i if necessary and proceed accordingly.  Preoperative clearance. Capsule oxycodone. No Phenergan continue gabapentin  Plan central decompression, microdiscectomy L2-3  Cecilie Kicks, PA-C for Dr Tonita Cong 06/28/2022, 3:24 PM

## 2022-07-15 ENCOUNTER — Other Ambulatory Visit: Payer: Self-pay | Admitting: Internal Medicine

## 2022-07-15 ENCOUNTER — Other Ambulatory Visit (HOSPITAL_COMMUNITY): Payer: Medicare Other

## 2022-07-15 DIAGNOSIS — J3089 Other allergic rhinitis: Secondary | ICD-10-CM

## 2022-07-15 NOTE — Pre-Procedure Instructions (Signed)
Surgical Instructions    Your procedure is scheduled on Thursday, July 20th.  Report to [facility] Main Entrance "A" at 05:30 A.M., then check in with the Admitting office.  Call this number if you have problems the morning of surgery:  [phone]   If you have any questions prior to your surgery date call [phone]: Open Monday-Friday 8am-4pm    Remember:  Do not eat after midnight the night before your surgery  You may drink clear liquids until 04:30 AM the morning of your surgery.   Clear liquids allowed are: Water, Non-Citrus Juices (without pulp), Carbonated Beverages, Clear Tea, Black Coffee Only (NO MILK, CREAM OR POWDERED CREAMER of any kind), and Gatorade.   Patient Instructions  The night before surgery:  No food after midnight. ONLY clear liquids after midnight  The day of surgery (if you do NOT have diabetes):  Drink ONE (1) Pre-Surgery Clear Ensure by 04:30 AM the morning of surgery. Drink in one sitting. Do not sip.  This drink was given to you during your hospital  pre-op appointment visit.  Nothing else to drink after completing the  Pre-Surgery Clear Ensure.          If you have questions, please contact your surgeon's office.      Take these medicines the morning of surgery with A SIP OF WATER  ALPRAZolam (XANAX)  atorvastatin (LIPITOR) buPROPion (WELLBUTRIN XL) dicyclomine (BENTYL)  FLOVENT HFA  gabapentin (NEURONTIN)    If needed: acetaminophen (TYLENOL)  fexofenadine (ALLEGRA)  Olopatadine HCl eye drops PROAIR HFA- if needed, bring with you on day of surgery  As of today, STOP taking any Aspirin (unless otherwise instructed by your surgeon) Aleve, Naproxen, Ibuprofen, Motrin, Advil, Goody's, BC's, all herbal medications, fish oil, and all vitamins.                     Do NOT Smoke (Tobacco/Vaping) for 24 hours prior to your procedure.  If you use a CPAP at night, you may bring your mask/headgear for your overnight stay.   Contacts,  glasses, piercing's, hearing aid's, dentures or partials may not be worn into surgery, please bring cases for these belongings.    For patients admitted to the hospital, discharge time will be determined by your treatment team.   Patients discharged the day of surgery will not be allowed to drive home, and someone needs to stay with them for 24 hours.  SURGICAL WAITING ROOM VISITATION Patients having surgery or a procedure may have two support people in the waiting room. These visitors may be switched out with other visitors if needed. Children under the age of 33 must have an adult accompany them who is not the patient. If the patient needs to stay at the hospital during part of their recovery, the visitor guidelines for inpatient rooms apply.  Please refer to the [facility] website for the visitor guidelines for Inpatients (after your surgery is over and you are in a regular room).    Special instructions:   Joseph Mejia- Preparing For Surgery  Before surgery, you can play an important role. Because skin is not sterile, your skin needs to be as free of germs as possible. You can reduce the number of germs on your skin by washing with CHG (chlorahexidine gluconate) Soap before surgery.  CHG is an antiseptic cleaner which kills germs and bonds with the skin to continue killing germs even after washing.    Oral Hygiene is also important to reduce your  risk of infection.  Remember - BRUSH YOUR TEETH THE MORNING OF SURGERY WITH YOUR REGULAR TOOTHPASTE  Please do not use if you have an allergy to CHG or antibacterial soaps. If your skin becomes reddened/irritated stop using the CHG.  Do not shave (including legs and underarms) for at least 48 hours prior to first CHG shower. It is OK to shave your face.  Please follow these instructions carefully.   Shower the NIGHT BEFORE SURGERY and the MORNING OF SURGERY  If you chose to wash your hair, wash your hair first as usual with your normal  shampoo.  After you shampoo, rinse your hair and body thoroughly to remove the shampoo.  Use CHG Soap as you would any other liquid soap. You can apply CHG directly to the skin and wash gently with a scrungie or a clean washcloth.   Apply the CHG Soap to your body ONLY FROM THE NECK DOWN.  Do not use on open wounds or open sores. Avoid contact with your eyes, ears, mouth and genitals (private parts). Wash Face and genitals (private parts)  with your normal soap.   Wash thoroughly, paying special attention to the area where your surgery will be performed.  Thoroughly rinse your body with warm water from the neck down.  DO NOT shower/wash with your normal soap after using and rinsing off the CHG Soap.  Pat yourself dry with a CLEAN TOWEL.  Wear CLEAN PAJAMAS to bed the night before surgery  Place CLEAN SHEETS on your bed the night before your surgery  DO NOT SLEEP WITH PETS.   Day of Surgery: Take a shower with CHG soap. Do not wear jewelry  Do not wear lotions, powders, colognes, or deodorant. Do not shave 48 hours prior to surgery.   Do not bring valuables to the hospital. [facility] is not responsible for any belongings or valuables.  Wear Clean/Comfortable clothing the morning of surgery Remember to brush your teeth WITH YOUR REGULAR TOOTHPASTE.   Please read over the following fact sheets that you were given.    If you received a COVID test during your pre-op visit  it is requested that you wear a mask when out in public, stay away from anyone that may not be feeling well and notify your surgeon if you develop symptoms. If you have been in contact with anyone that has tested positive in the last 10 days please notify you surgeon.

## 2022-07-16 ENCOUNTER — Encounter (HOSPITAL_COMMUNITY): Payer: Self-pay

## 2022-07-16 ENCOUNTER — Other Ambulatory Visit: Payer: Self-pay

## 2022-07-16 ENCOUNTER — Encounter (HOSPITAL_COMMUNITY)
Admission: RE | Admit: 2022-07-16 | Discharge: 2022-07-16 | Disposition: A | Discharge status: Home / Self Care | Payer: Medicare Other | Source: Ambulatory Visit | Attending: Specialist | Admitting: Specialist

## 2022-07-16 ENCOUNTER — Ambulatory Visit (HOSPITAL_COMMUNITY)
Admission: RE | Admit: 2022-07-16 | Discharge: 2022-07-16 | Disposition: A | Discharge status: Home / Self Care | Payer: Medicare Other | Source: Ambulatory Visit | Attending: Orthopedic Surgery | Admitting: Orthopedic Surgery

## 2022-07-16 VITALS — BP 142/74 | HR 54 | Temp 97.7°F | Resp 17 | Ht 65.0 in | Wt 152.6 lb

## 2022-07-16 DIAGNOSIS — Z01818 Encounter for other preprocedural examination: Secondary | ICD-10-CM | POA: Diagnosis not present

## 2022-07-16 DIAGNOSIS — R011 Cardiac murmur, unspecified: Secondary | ICD-10-CM | POA: Diagnosis not present

## 2022-07-16 DIAGNOSIS — N183 Chronic kidney disease, stage 3 unspecified: Secondary | ICD-10-CM | POA: Insufficient documentation

## 2022-07-16 DIAGNOSIS — I129 Hypertensive chronic kidney disease with stage 1 through stage 4 chronic kidney disease, or unspecified chronic kidney disease: Secondary | ICD-10-CM | POA: Diagnosis not present

## 2022-07-16 DIAGNOSIS — M48062 Spinal stenosis, lumbar region with neurogenic claudication: Secondary | ICD-10-CM | POA: Diagnosis not present

## 2022-07-16 DIAGNOSIS — M5136 Other intervertebral disc degeneration, lumbar region: Secondary | ICD-10-CM | POA: Diagnosis not present

## 2022-07-16 DIAGNOSIS — M545 Low back pain, unspecified: Secondary | ICD-10-CM | POA: Diagnosis not present

## 2022-07-16 HISTORY — DX: Pleurisy: R09.1

## 2022-07-16 LAB — CBC
HCT: 42.8 % (ref 39.0–52.0)
Hemoglobin: 14.1 g/dL (ref 13.0–17.0)
MCH: 31.1 pg (ref 26.0–34.0)
MCHC: 32.9 g/dL (ref 30.0–36.0)
MCV: 94.5 fL (ref 80.0–100.0)
Platelets: 249 10*3/uL (ref 150–400)
RBC: 4.53 MIL/uL (ref 4.22–5.81)
RDW: 13 % (ref 11.5–15.5)
WBC: 7.5 10*3/uL (ref 4.0–10.5)
nRBC: 0 % (ref 0.0–0.2)

## 2022-07-16 LAB — SURGICAL PCR SCREEN
MRSA, PCR: POSITIVE — AB
Staphylococcus aureus: POSITIVE — AB

## 2022-07-16 LAB — BASIC METABOLIC PANEL
Anion gap: 7 (ref 5–15)
BUN: 23 mg/dL (ref 8–23)
CO2: 27 mmol/L (ref 22–32)
Calcium: 9.5 mg/dL (ref 8.9–10.3)
Chloride: 108 mmol/L (ref 98–111)
Creatinine, Ser: 1.94 mg/dL — ABNORMAL HIGH (ref 0.61–1.24)
GFR, Estimated: 35 mL/min — ABNORMAL LOW (ref >60)
Glucose, Bld: 98 mg/dL (ref 70–99)
Potassium: 4.7 mmol/L (ref 3.5–5.1)
Sodium: 142 mmol/L (ref 135–145)

## 2022-07-16 NOTE — Progress Notes (Signed)
Anesthesia Chart Review:  Evaluated patient at PAT visit due to history of murmur.  Patient endorses history of cardiac murmur for "as long as he can member".  In 2009 he did have evaluation by cardiology and had a stress test which was nonischemic and also reports that he had an echocardiogram that was reportedly benign and did not require followup  Unfortunately, we do not have access to the record of the echo.  Up until about 3 months ago he had a reasonably good functional status.  He reports walking on an indoor track for 1 to 2 miles a few days a week and says he was able to go up 2 flights of stairs without issue. He denies any history of CP, DOE, syncope.  In May of this year he was pressure washing his driveway and herniated a disc, activity significant limited since then, currently ambulating with rolling walker secondary to pain.  On exam he is well-appearing, NAD, heart is RRR, there is a 2/6 systolic murmur.  EKG done today shows NSR, rate 66.  Other pertinent history includes HLD, CKD 3, HTN, C5-7 ACDF.  Reviewed history with anesthesiologist Dr. Smith Robert. He advised that given pt's reasonable functional status prior to his injury and lack of symptoms, ok to proceed with surgery as planned.   Preop labs reviewed, creatinine elevated 1.94 consistent with history of CKD (baseline ~1.8), otherwise unremarkable.   Nuclear stress 11/29/2008: Overall impression: There is no sign of scar or ischemia.   Rebekah, Spr[facility]/Anesthesiology Phone [phone] 07/17/2022 10:30 AM

## 2022-07-16 NOTE — Progress Notes (Signed)
LVM for Joseph Mejia, Dr. Reather Littler surgery scheduler, regarding pt's positive PCR.

## 2022-07-16 NOTE — Progress Notes (Signed)
PCP - Dr. Lelon Frohlich Cardiologist - denies  PPM/ICD - denies   Chest x-ray - 04/17/15 EKG - 07/16/22 Stress Test - 11/29/08 ECHO - denies Cardiac Cath - denies  Sleep Study - denies  DM- denies  ASA/Blood Thinner Instructions: n/a   ERAS Protcol - yes PRE-SURGERY Ensure given at PAT  COVID TEST- n/a   Anesthesia review: yes, heart murmur. Thierry saw pt in PAT  Patient denies shortness of breath, fever, cough and chest pain at PAT appointment   All instructions explained to the patient, with a verbal understanding of the material. Patient agrees to go over the instructions while at home for a better understanding. The opportunity to ask questions was provided.

## 2022-07-17 ENCOUNTER — Ambulatory Visit: Payer: Self-pay | Admitting: Orthopedic Surgery

## 2022-07-17 NOTE — Anesthesia Preprocedure Evaluation (Addendum)
Anesthesia Evaluation  Patient identified by MRN, date of birth, ID band Patient awake    Reviewed: Allergy & Precautions, NPO status , Patient's Chart, lab work & pertinent test results  Airway Mallampati: II  TM Distance: >3 FB Neck ROM: Full    Dental no notable dental hx. (+) Teeth Intact, Dental Advisory Given   Pulmonary asthma ,    Pulmonary exam normal breath sounds clear to auscultation       Cardiovascular hypertension, Normal cardiovascular exam Rhythm:Regular Rate:Normal + Systolic murmurs Nuclear stress 11/29/2008: Overall impression: There is no sign of scar or ischemia.   Neuro/Psych PSYCHIATRIC DISORDERS Anxiety Depression negative neurological ROS     GI/Hepatic Neg liver ROS, PUD, GERD  ,  Endo/Other  negative endocrine ROS  Renal/GU Renal InsufficiencyRenal disease  negative genitourinary   Musculoskeletal  (+) Arthritis , C5-7 ACDF   Abdominal   Peds  Hematology negative hematology ROS (+)   Anesthesia Other Findings   Reproductive/Obstetrics                           Anesthesia Physical Anesthesia Plan  ASA: 3  Anesthesia Plan: General   Post-op Pain Management: Tylenol PO (pre-op)*   Induction: Intravenous  PONV Risk Score and Plan: 2 and Dexamethasone, Ondansetron and Treatment may vary due to age or medical condition  Airway Management Planned: Oral ETT and Video Laryngoscope Planned  Additional Equipment:   Intra-op Plan:   Post-operative Plan: Extubation in OR  Informed Consent: I have reviewed the patients History and Physical, chart, labs and discussed the procedure including the risks, benefits and alternatives for the proposed anesthesia with the patient or authorized representative who has indicated his/her understanding and acceptance.     Dental advisory given  Plan Discussed with: CRNA  Anesthesia Plan Comments: (PAT note by Karoline Caldwell,  PA-C: Evaluated patient at PAT visit due to history of murmur.  Patient endorses history of cardiac murmur for "as long as he can member".  In 2009 he did have evaluation by cardiology and had a stress test which was nonischemic and also reports that he had an echocardiogram that was reportedly benign and did not require followup  Unfortunately, we do not have access to the record of the echo.  Up until about 3 months ago he had a reasonably good functional status.  He reports walking on an indoor track for 1 to 2 miles a few days a week and says he was able to go up 2 flights of stairs without issue. He denies any history of CP, DOE, syncope.  In May of this year he was pressure washing his driveway and herniated a disc, activity significant limited since then, currently ambulating with rolling walker secondary to pain.  On exam he is well-appearing, NAD, heart is RRR, there is a 2/6 systolic murmur.  EKG done today shows NSR, rate 66.  Other pertinent history includes HLD, CKD 3, HTN, C5-7 ACDF.  Reviewed history with anesthesiologist Dr. Smith Robert. He advised that given pt's reasonable functional status prior to his injury and lack of symptoms, ok to proceed with surgery as planned.   Preop labs reviewed, creatinine elevated 1.94 consistent with history of CKD (baseline ~1.8), otherwise unremarkable.   Nuclear stress 11/29/2008: Overall impression: There is no sign of scar or ischemia. )       Anesthesia Quick Evaluation

## 2022-07-18 ENCOUNTER — Ambulatory Visit (HOSPITAL_BASED_OUTPATIENT_CLINIC_OR_DEPARTMENT_OTHER): Payer: Medicare Other | Admitting: Anesthesiology

## 2022-07-18 ENCOUNTER — Other Ambulatory Visit: Payer: Self-pay

## 2022-07-18 ENCOUNTER — Encounter (HOSPITAL_COMMUNITY)
Admission: RE | Disposition: A | Discharge status: Home / Self Care | Payer: Self-pay | Source: Home / Self Care | Attending: Specialist

## 2022-07-18 ENCOUNTER — Ambulatory Visit (HOSPITAL_COMMUNITY)
Admission: RE | Admit: 2022-07-18 | Discharge: 2022-07-18 | Disposition: A | Discharge status: Home / Self Care | Payer: Medicare Other | Attending: Specialist | Admitting: Specialist

## 2022-07-18 ENCOUNTER — Encounter (HOSPITAL_COMMUNITY): Payer: Self-pay | Admitting: Specialist

## 2022-07-18 ENCOUNTER — Ambulatory Visit (HOSPITAL_COMMUNITY): Payer: Medicare Other

## 2022-07-18 ENCOUNTER — Ambulatory Visit (HOSPITAL_COMMUNITY): Payer: Medicare Other | Admitting: Physician Assistant

## 2022-07-18 DIAGNOSIS — M5126 Other intervertebral disc displacement, lumbar region: Secondary | ICD-10-CM | POA: Diagnosis present

## 2022-07-18 DIAGNOSIS — F418 Other specified anxiety disorders: Secondary | ICD-10-CM

## 2022-07-18 DIAGNOSIS — Z981 Arthrodesis status: Secondary | ICD-10-CM | POA: Diagnosis not present

## 2022-07-18 DIAGNOSIS — M48061 Spinal stenosis, lumbar region without neurogenic claudication: Secondary | ICD-10-CM | POA: Diagnosis not present

## 2022-07-18 DIAGNOSIS — M5116 Intervertebral disc disorders with radiculopathy, lumbar region: Secondary | ICD-10-CM | POA: Insufficient documentation

## 2022-07-18 DIAGNOSIS — G9519 Other vascular myelopathies: Secondary | ICD-10-CM | POA: Diagnosis not present

## 2022-07-18 DIAGNOSIS — I10 Essential (primary) hypertension: Secondary | ICD-10-CM | POA: Insufficient documentation

## 2022-07-18 DIAGNOSIS — J45909 Unspecified asthma, uncomplicated: Secondary | ICD-10-CM | POA: Insufficient documentation

## 2022-07-18 DIAGNOSIS — N289 Disorder of kidney and ureter, unspecified: Secondary | ICD-10-CM | POA: Diagnosis not present

## 2022-07-18 DIAGNOSIS — K219 Gastro-esophageal reflux disease without esophagitis: Secondary | ICD-10-CM | POA: Insufficient documentation

## 2022-07-18 HISTORY — PX: LUMBAR LAMINECTOMY/DECOMPRESSION MICRODISCECTOMY: SHX5026

## 2022-07-18 SURGERY — LUMBAR LAMINECTOMY/DECOMPRESSION MICRODISCECTOMY 1 LEVEL
Anesthesia: General

## 2022-07-18 MED ORDER — VANCOMYCIN HCL IN DEXTROSE 1-5 GM/200ML-% IV SOLN
1000.0000 mg | INTRAVENOUS | Status: AC
  Administered 2022-07-18: 1000 mg via INTRAVENOUS
  Filled 2022-07-18: qty 200

## 2022-07-18 MED ORDER — ALBUTEROL SULFATE (2.5 MG/3ML) 0.083% IN NEBU: 2.5000 mg | INHALATION_SOLUTION | Freq: Four times a day (QID) | RESPIRATORY_TRACT | Status: DC | PRN

## 2022-07-18 MED ORDER — BUDESONIDE 0.25 MG/2ML IN SUSP
0.2500 mg | Freq: Two times a day (BID) | RESPIRATORY_TRACT | Status: DC
  Administered 2022-07-18: 0.25 mg via RESPIRATORY_TRACT
  Filled 2022-07-18: qty 2

## 2022-07-18 MED ORDER — POLYETHYLENE GLYCOL 3350 17 G PO PACK: 17.0000 g | PACK | Freq: Every day | ORAL | Status: DC | PRN

## 2022-07-18 MED ORDER — ACETAMINOPHEN 10 MG/ML IV SOLN
1000.0000 mg | INTRAVENOUS | Status: DC
  Filled 2022-07-18: qty 100

## 2022-07-18 MED ORDER — THROMBIN 20000 UNITS EX SOLR
CUTANEOUS | Status: AC
  Filled 2022-07-18: qty 20000

## 2022-07-18 MED ORDER — 0.9 % SODIUM CHLORIDE (POUR BTL) OPTIME
TOPICAL | Status: DC | PRN
  Administered 2022-07-18: 1000 mL

## 2022-07-18 MED ORDER — ROCURONIUM BROMIDE 10 MG/ML (PF) SYRINGE
PREFILLED_SYRINGE | INTRAVENOUS | Status: DC | PRN
  Administered 2022-07-18: 70 mg via INTRAVENOUS

## 2022-07-18 MED ORDER — BUPIVACAINE-EPINEPHRINE 0.5% -1:200000 IJ SOLN
INTRAMUSCULAR | Status: AC
  Filled 2022-07-18: qty 1

## 2022-07-18 MED ORDER — TRANEXAMIC ACID 1000 MG/10ML IV SOLN
2000.0000 mg | Freq: Once | INTRAVENOUS | Status: AC
  Filled 2022-07-18: qty 20

## 2022-07-18 MED ORDER — CEFAZOLIN SODIUM-DEXTROSE 2-4 GM/100ML-% IV SOLN
2.0000 g | Freq: Once | INTRAVENOUS | Status: DC
Start: 2022-07-19 — End: 2022-07-18

## 2022-07-18 MED ORDER — HYDROMORPHONE HCL 1 MG/ML IJ SOLN: 0.5000 mg | INTRAMUSCULAR | Status: DC | PRN

## 2022-07-18 MED ORDER — ONDANSETRON HCL 4 MG PO TABS: 4.0000 mg | ORAL_TABLET | Freq: Four times a day (QID) | ORAL | Status: DC | PRN

## 2022-07-18 MED ORDER — OXYCODONE HCL 5 MG PO TABS: 5.0000 mg | ORAL_TABLET | ORAL | 0 refills | Status: DC | PRN

## 2022-07-18 MED ORDER — ALBUTEROL SULFATE HFA 108 (90 BASE) MCG/ACT IN AERS
2.0000 | INHALATION_SPRAY | Freq: Four times a day (QID) | RESPIRATORY_TRACT | Status: DC | PRN
Start: 2022-07-18 — End: 2022-07-18

## 2022-07-18 MED ORDER — MIDAZOLAM HCL 2 MG/2ML IJ SOLN
INTRAMUSCULAR | Status: AC
Start: 2022-07-18 — End: ?
  Filled 2022-07-18: qty 2

## 2022-07-18 MED ORDER — PROPOFOL 10 MG/ML IV BOLUS
INTRAVENOUS | Status: DC | PRN
  Administered 2022-07-18: 130 mg via INTRAVENOUS

## 2022-07-18 MED ORDER — KCL IN DEXTROSE-NACL 20-5-0.45 MEQ/L-%-% IV SOLN: INTRAVENOUS | Status: DC

## 2022-07-18 MED ORDER — TRANEXAMIC ACID-NACL 1000-0.7 MG/100ML-% IV SOLN
1000.0000 mg | INTRAVENOUS | Status: AC
  Administered 2022-07-18: 1000 mg via INTRAVENOUS
  Filled 2022-07-18: qty 100

## 2022-07-18 MED ORDER — FLUTICASONE PROPIONATE 50 MCG/ACT NA SUSP
1.0000 | Freq: Every day | NASAL | Status: DC | PRN
Start: 2022-07-18 — End: 2022-07-18

## 2022-07-18 MED ORDER — DOCUSATE SODIUM 100 MG PO CAPS: 100.0000 mg | ORAL_CAPSULE | Freq: Two times a day (BID) | ORAL | 1 refills | Status: DC | PRN

## 2022-07-18 MED ORDER — BUPIVACAINE-EPINEPHRINE 0.5% -1:200000 IJ SOLN
INTRAMUSCULAR | Status: DC | PRN
  Administered 2022-07-18: 6 mL

## 2022-07-18 MED ORDER — MIDAZOLAM HCL 5 MG/5ML IJ SOLN
INTRAMUSCULAR | Status: DC | PRN
  Administered 2022-07-18: 2 mg via INTRAVENOUS

## 2022-07-18 MED ORDER — PHENOL 1.4 % MT LIQD
1.0000 | OROMUCOSAL | Status: DC | PRN
Start: 2022-07-18 — End: 2022-07-18

## 2022-07-18 MED ORDER — CEFAZOLIN SODIUM-DEXTROSE 2-4 GM/100ML-% IV SOLN
2.0000 g | INTRAVENOUS | Status: AC
  Administered 2022-07-18: 2 g via INTRAVENOUS
  Filled 2022-07-18: qty 100

## 2022-07-18 MED ORDER — ACETAMINOPHEN 500 MG PO TABS
1000.0000 mg | ORAL_TABLET | Freq: Four times a day (QID) | ORAL | Status: DC
  Administered 2022-07-18: 1000 mg via ORAL
  Filled 2022-07-18: qty 2

## 2022-07-18 MED ORDER — METHOCARBAMOL 500 MG PO TABS
500.0000 mg | ORAL_TABLET | Freq: Four times a day (QID) | ORAL | Status: DC | PRN
  Administered 2022-07-18: 500 mg via ORAL
  Filled 2022-07-18: qty 1

## 2022-07-18 MED ORDER — LACTATED RINGERS IV SOLN: INTRAVENOUS | Status: DC

## 2022-07-18 MED ORDER — DOCUSATE SODIUM 100 MG PO CAPS
100.0000 mg | ORAL_CAPSULE | Freq: Two times a day (BID) | ORAL | Status: DC
  Administered 2022-07-18: 100 mg via ORAL
  Filled 2022-07-18: qty 1

## 2022-07-18 MED ORDER — ONDANSETRON HCL 4 MG/2ML IJ SOLN
4.0000 mg | Freq: Four times a day (QID) | INTRAMUSCULAR | Status: DC | PRN
Start: 2022-07-18 — End: 2022-07-18

## 2022-07-18 MED ORDER — PHENYLEPHRINE HCL-NACL 20-0.9 MG/250ML-% IV SOLN
INTRAVENOUS | Status: DC | PRN
  Administered 2022-07-18: 25 ug/min via INTRAVENOUS

## 2022-07-18 MED ORDER — CHLORHEXIDINE GLUCONATE 0.12 % MT SOLN
15.0000 mL | Freq: Once | OROMUCOSAL | Status: AC
  Administered 2022-07-18: 15 mL via OROMUCOSAL
  Filled 2022-07-18: qty 15

## 2022-07-18 MED ORDER — PANTOPRAZOLE SODIUM 40 MG PO TBEC: 40.0000 mg | DELAYED_RELEASE_TABLET | Freq: Every day | ORAL | Status: DC

## 2022-07-18 MED ORDER — PHENYLEPHRINE 80 MCG/ML (10ML) SYRINGE FOR IV PUSH (FOR BLOOD PRESSURE SUPPORT)
PREFILLED_SYRINGE | INTRAVENOUS | Status: DC | PRN
  Administered 2022-07-18: 160 ug via INTRAVENOUS
  Administered 2022-07-18 (×2): 80 ug via INTRAVENOUS
  Administered 2022-07-18: 160 ug via INTRAVENOUS

## 2022-07-18 MED ORDER — ADULT MULTIVITAMIN W/MINERALS CH
1.0000 | ORAL_TABLET | Freq: Every day | ORAL | Status: DC
  Administered 2022-07-18: 1 via ORAL
  Filled 2022-07-18: qty 1

## 2022-07-18 MED ORDER — OLOPATADINE HCL 0.1 % OP SOLN: 1.0000 [drp] | Freq: Every day | OPHTHALMIC | Status: DC | PRN

## 2022-07-18 MED ORDER — THROMBIN 20000 UNITS EX SOLR
CUTANEOUS | Status: DC | PRN
  Administered 2022-07-18: 20 mL via TOPICAL

## 2022-07-18 MED ORDER — DEXTROSE-NACL 5-0.45 % IV SOLN: INTRAVENOUS | Status: DC

## 2022-07-18 MED ORDER — DEXAMETHASONE SODIUM PHOSPHATE 10 MG/ML IJ SOLN
INTRAMUSCULAR | Status: DC | PRN
  Administered 2022-07-18: 10 mg via INTRAVENOUS

## 2022-07-18 MED ORDER — POLYETHYLENE GLYCOL 3350 17 G PO PACK: 17.0000 g | PACK | Freq: Every day | ORAL | 0 refills | Status: AC

## 2022-07-18 MED ORDER — LORATADINE 10 MG PO TABS: 10.0000 mg | ORAL_TABLET | Freq: Every day | ORAL | Status: DC | PRN

## 2022-07-18 MED ORDER — ALPRAZOLAM 0.25 MG PO TABS: 0.1250 mg | ORAL_TABLET | Freq: Every evening | ORAL | Status: DC | PRN

## 2022-07-18 MED ORDER — LIDOCAINE 2% (20 MG/ML) 5 ML SYRINGE
INTRAMUSCULAR | Status: DC | PRN
  Administered 2022-07-18: 40 mg via INTRAVENOUS

## 2022-07-18 MED ORDER — DICYCLOMINE HCL 20 MG PO TABS: 20.0000 mg | ORAL_TABLET | Freq: Every day | ORAL | Status: DC

## 2022-07-18 MED ORDER — METHOCARBAMOL 1000 MG/10ML IJ SOLN: 500.0000 mg | Freq: Four times a day (QID) | INTRAVENOUS | Status: DC | PRN

## 2022-07-18 MED ORDER — ORAL CARE MOUTH RINSE: 15.0000 mL | Freq: Once | OROMUCOSAL | Status: AC

## 2022-07-18 MED ORDER — AMPHETAMINE-DEXTROAMPHETAMINE 5 MG PO TABS: 5.0000 mg | ORAL_TABLET | Freq: Every day | ORAL | Status: DC | PRN

## 2022-07-18 MED ORDER — MENTHOL 3 MG MT LOZG: 1.0000 | LOZENGE | OROMUCOSAL | Status: DC | PRN

## 2022-07-18 MED ORDER — FENTANYL CITRATE (PF) 100 MCG/2ML IJ SOLN
INTRAMUSCULAR | Status: AC
  Filled 2022-07-18: qty 2

## 2022-07-18 MED ORDER — SIMETHICONE 80 MG PO CHEW: 80.0000 mg | CHEWABLE_TABLET | Freq: Every day | ORAL | Status: DC | PRN

## 2022-07-18 MED ORDER — SUGAMMADEX SODIUM 200 MG/2ML IV SOLN
INTRAVENOUS | Status: DC | PRN
  Administered 2022-07-18: 200 mg via INTRAVENOUS

## 2022-07-18 MED ORDER — LISINOPRIL 10 MG PO TABS: 10.0000 mg | ORAL_TABLET | Freq: Every day | ORAL | Status: DC

## 2022-07-18 MED ORDER — PROPOFOL 10 MG/ML IV BOLUS
INTRAVENOUS | Status: AC
  Filled 2022-07-18: qty 20

## 2022-07-18 MED ORDER — FENTANYL CITRATE (PF) 250 MCG/5ML IJ SOLN
INTRAMUSCULAR | Status: DC | PRN
Start: 2022-07-18 — End: 2022-07-18
  Administered 2022-07-18 (×4): 50 ug via INTRAVENOUS

## 2022-07-18 MED ORDER — ONDANSETRON HCL 4 MG/2ML IJ SOLN
INTRAMUSCULAR | Status: DC | PRN
  Administered 2022-07-18: 4 mg via INTRAVENOUS

## 2022-07-18 MED ORDER — BISACODYL 5 MG PO TBEC: 5.0000 mg | DELAYED_RELEASE_TABLET | Freq: Every day | ORAL | Status: DC | PRN

## 2022-07-18 MED ORDER — FENTANYL CITRATE (PF) 250 MCG/5ML IJ SOLN
INTRAMUSCULAR | Status: AC
  Filled 2022-07-18: qty 5

## 2022-07-18 MED ORDER — RISAQUAD PO CAPS
1.0000 | ORAL_CAPSULE | Freq: Every day | ORAL | Status: DC
  Administered 2022-07-18: 1 via ORAL
  Filled 2022-07-18: qty 1

## 2022-07-18 MED ORDER — OXYCODONE HCL 5 MG PO TABS
10.0000 mg | ORAL_TABLET | ORAL | Status: DC | PRN
  Administered 2022-07-18 (×2): 10 mg via ORAL
  Filled 2022-07-18: qty 2

## 2022-07-18 MED ORDER — OXYCODONE HCL 5 MG PO TABS
5.0000 mg | ORAL_TABLET | ORAL | Status: DC | PRN
  Filled 2022-07-18: qty 1

## 2022-07-18 MED ORDER — FENTANYL CITRATE (PF) 100 MCG/2ML IJ SOLN
25.0000 ug | INTRAMUSCULAR | Status: DC | PRN
  Administered 2022-07-18: 25 ug via INTRAVENOUS
  Administered 2022-07-18: 50 ug via INTRAVENOUS

## 2022-07-18 MED ORDER — BUPROPION HCL ER (XL) 300 MG PO TB24: 300.0000 mg | ORAL_TABLET | Freq: Every day | ORAL | Status: DC

## 2022-07-18 MED ORDER — GABAPENTIN 300 MG PO CAPS
300.0000 mg | ORAL_CAPSULE | Freq: Three times a day (TID) | ORAL | Status: DC
  Administered 2022-07-18: 300 mg via ORAL
  Filled 2022-07-18: qty 1

## 2022-07-18 MED ORDER — ACETAMINOPHEN 500 MG PO TABS
1000.0000 mg | ORAL_TABLET | Freq: Once | ORAL | Status: AC
  Administered 2022-07-18: 1000 mg via ORAL
  Filled 2022-07-18: qty 2

## 2022-07-18 SURGICAL SUPPLY — 64 items
BAG COUNTER SPONGE SURGICOUNT (BAG) ×3 IMPLANT
BAG DECANTER FOR FLEXI CONT (MISCELLANEOUS) IMPLANT
BAG SPNG CNTER NS LX DISP (BAG) ×2
BAND INSRT 18 STRL LF DISP RB (MISCELLANEOUS) ×2
BAND RUBBER #18 3X1/16 STRL (MISCELLANEOUS) ×4 IMPLANT
BUR EGG ELITE 5.0 (BURR) IMPLANT
BUR RND DIAMOND ELITE 4.0 (BURR) IMPLANT
BUR STRYKR EGG 5.0 (BURR) IMPLANT
CARTRIDGE OIL MAESTRO DRILL (MISCELLANEOUS) IMPLANT
CLEANER TIP ELECTROSURG 2X2 (MISCELLANEOUS) ×2 IMPLANT
CNTNR URN SCR LID CUP LEK RST (MISCELLANEOUS) ×1 IMPLANT
CONT SPEC 4OZ STRL OR WHT (MISCELLANEOUS) ×2
DIFFUSER DRILL AIR PNEUMATIC (MISCELLANEOUS) IMPLANT
DRAPE LAPAROTOMY 100X72X124 (DRAPES) ×2 IMPLANT
DRAPE MICROSCOPE LEICA (MISCELLANEOUS) ×2 IMPLANT
DRAPE SHEET LG 3/4 BI-LAMINATE (DRAPES) ×2 IMPLANT
DRAPE SURG 17X11 SM STRL (DRAPES) ×2 IMPLANT
DRAPE UTILITY XL STRL (DRAPES) ×2 IMPLANT
DRSG AQUACEL AG ADV 3.5X 4 (GAUZE/BANDAGES/DRESSINGS) IMPLANT
DRSG AQUACEL AG ADV 3.5X 6 (GAUZE/BANDAGES/DRESSINGS) ×1 IMPLANT
DRSG TELFA 3X8 NADH (GAUZE/BANDAGES/DRESSINGS) IMPLANT
DURAPREP 26ML APPLICATOR (WOUND CARE) ×2 IMPLANT
DURASEAL SPINE SEALANT 3ML (MISCELLANEOUS) IMPLANT
ELECT BLADE 4.0 EZ CLEAN MEGAD (MISCELLANEOUS)
ELECT REM PT RETURN 9FT ADLT (ELECTROSURGICAL) ×2
ELECTRODE BLDE 4.0 EZ CLN MEGD (MISCELLANEOUS) IMPLANT
ELECTRODE REM PT RTRN 9FT ADLT (ELECTROSURGICAL) ×1 IMPLANT
GLOVE BIOGEL PI IND STRL 7.5 (GLOVE) ×1 IMPLANT
GLOVE BIOGEL PI INDICATOR 7.5 (GLOVE) ×1
GLOVE SS BIOGEL STRL SZ 7 (GLOVE) ×1 IMPLANT
GLOVE SS BIOGEL STRL SZ 8 (GLOVE) ×2 IMPLANT
GLOVE SUPERSENSE BIOGEL SZ 7 (GLOVE) ×1
GLOVE SUPERSENSE BIOGEL SZ 8 (GLOVE) ×2
GOWN STRL REUS W/ TWL LRG LVL3 (GOWN DISPOSABLE) ×1 IMPLANT
GOWN STRL REUS W/ TWL XL LVL3 (GOWN DISPOSABLE) ×1 IMPLANT
GOWN STRL REUS W/TWL LRG LVL3 (GOWN DISPOSABLE) ×4
GOWN STRL REUS W/TWL XL LVL3 (GOWN DISPOSABLE) ×2
IV CATH 14GX2 1/4 (CATHETERS) ×2 IMPLANT
KIT BASIN OR (CUSTOM PROCEDURE TRAY) ×2 IMPLANT
NDL SPNL 18GX3.5 QUINCKE PK (NEEDLE) ×2 IMPLANT
NEEDLE 22X1 1/2 (OR ONLY) (NEEDLE) ×2 IMPLANT
NEEDLE SPNL 18GX3.5 QUINCKE PK (NEEDLE) ×4 IMPLANT
OIL CARTRIDGE MAESTRO DRILL (MISCELLANEOUS)
PACK LAMINECTOMY NEURO (CUSTOM PROCEDURE TRAY) ×2 IMPLANT
PAD DRESSING TELFA 3X8 NADH (GAUZE/BANDAGES/DRESSINGS) IMPLANT
PATTIES SURGICAL .75X.75 (GAUZE/BANDAGES/DRESSINGS) ×2 IMPLANT
SPONGE SURGIFOAM ABS GEL 100 (HEMOSTASIS) ×2 IMPLANT
SPONGE T-LAP 4X18 ~~LOC~~+RFID (SPONGE) IMPLANT
STAPLER VISISTAT (STAPLE) ×1 IMPLANT
STRIP CLOSURE SKIN 1/2X4 (GAUZE/BANDAGES/DRESSINGS) ×1 IMPLANT
SUT NURALON 4 0 TR CR/8 (SUTURE) IMPLANT
SUT PROLENE 3 0 PS 2 (SUTURE) IMPLANT
SUT VIC AB 1 CT1 27 (SUTURE)
SUT VIC AB 1 CT1 27XBRD ANTBC (SUTURE) IMPLANT
SUT VIC AB 1-0 CT2 27 (SUTURE) ×2 IMPLANT
SUT VIC AB 2-0 CT1 27 (SUTURE)
SUT VIC AB 2-0 CT1 TAPERPNT 27 (SUTURE) IMPLANT
SUT VIC AB 2-0 CT2 27 (SUTURE) ×1 IMPLANT
SYR 3ML LL SCALE MARK (SYRINGE) ×2 IMPLANT
TOWEL GREEN STERILE (TOWEL DISPOSABLE) ×2 IMPLANT
TOWEL GREEN STERILE FF (TOWEL DISPOSABLE) ×2 IMPLANT
TRAY FOLEY MTR SLVR 16FR STAT (SET/KITS/TRAYS/PACK) ×2 IMPLANT
WIPE CHG CHLORHEXIDINE 2% (PERSONAL CARE ITEMS) ×3 IMPLANT
YANKAUER SUCT BULB TIP NO VENT (SUCTIONS) ×2 IMPLANT

## 2022-07-18 NOTE — Anesthesia Procedure Notes (Signed)
Procedure Name: Intubation Date/Time: 07/18/2022 7:37 AM  Performed by: Wilburn Cornelia, CRNAPre-anesthesia Checklist: Patient identified, Emergency Drugs available, Suction available, Patient being monitored and Timeout performed Patient Re-evaluated:Patient Re-evaluated prior to induction Oxygen Delivery Method: Circle system utilized Preoxygenation: Pre-oxygenation with 100% oxygen Induction Type: IV induction Ventilation: Mask ventilation without difficulty Grade View: Grade I Tube type: Oral Tube size: 7.5 mm Number of attempts: 1 Airway Equipment and Method: Video-laryngoscopy and Stylet Placement Confirmation: positive ETCO2, CO2 detector, breath sounds checked- equal and bilateral and ETT inserted through vocal cords under direct vision Secured at: 22 cm Tube secured with: Tape Dental Injury: Teeth and Oropharynx as per pre-operative assessment  Difficulty Due To: Difficulty was anticipated, Difficult Airway- due to limited oral opening and Difficult Airway- due to reduced neck mobility Future Recommendations: Recommend- induction with short-acting agent, and alternative techniques readily available

## 2022-07-18 NOTE — Transfer of Care (Signed)
Immediate Anesthesia Transfer of Care Note  Patient: Joseph Mejia  Procedure(s) Performed: Central decompression microdiscectomy Lumbsr two-three  Patient Location: PACU  Anesthesia Type:General  Level of Consciousness: awake and alert   Airway & Oxygen Therapy: Patient Spontanous Breathing and Patient connected to nasal cannula oxygen  Post-op Assessment: Report given to RN and Post -op Vital signs reviewed and stable  Post vital signs: Reviewed and stable  Last Vitals:  Vitals Value Taken Time  BP 145/74   Temp    Pulse 83 07/18/22 0933  Resp 14   SpO2 99 % 07/18/22 0933  Vitals shown include unvalidated device data.  Last Pain:  Vitals:   07/18/22 0618  TempSrc:   PainSc: 7       Patients Stated Pain Goal: 2 (61/53/79 4327)  Complications:  Encounter Notable Events  Notable Event Outcome Phase Comment  Difficult to intubate - expected  Intraprocedure Filed from anesthesia note documentation.

## 2022-07-18 NOTE — Evaluation (Signed)
Occupational Therapy Evaluation Patient Details Name: Joseph Mejia MRN: [id number] DOB: [birth year] Today's Date: 07/18/2022   History of Present Illness 76 yo M s/p laminectomy L2-L3 with bilateral hemilaminotomies and lateral recess decompression; Foraminotomies L2-L3, left and Microdiskectomy L2-L3.  PMH includes: Anxiety, ADD, HTN.   Clinical Impression   Patient admitted for the diagnosis and procedure above.  PTA he lives at home with his spouse, who is able to assist as needed.  Patient did use a RW at times depending on radicular pain, but otherwise completed all his ADL/iADL, and continues to drive.  Precaution sheet reviewed with verbalized understanding, ADL completed from sit to stand level with setup, and able to walk the halls with generalized supervision.  Patient will have the assist needed at home, no further OT needs in the acute setting, and recommend follow up with the MD as prescribed.        Recommendations for follow up therapy are one component of a multi-disciplinary discharge planning process, led by the attending physician.  Recommendations may be updated based on patient status, additional functional criteria and insurance authorization.   Follow Up Recommendations  Follow physician's recommendations for discharge plan and follow up therapies    Assistance Recommended at Discharge PRN  Patient can return home with the following Assist for transportation;Assistance with cooking/housework    Functional Status Assessment  Patient has had a recent decline in their functional status and demonstrates the ability to make significant improvements in function in a reasonable and predictable amount of time.  Equipment Recommendations  None recommended by OT    Recommendations for Other Services       Precautions / Restrictions Precautions Precautions: Back Precaution Booklet Issued: Yes (comment) Precaution Comments: reviewed Restrictions Weight Bearing  Restrictions: No      Mobility Bed Mobility Overal bed mobility: Modified Independent                  Transfers Overall transfer level: Modified independent                 General transfer comment: Occasional rest with hand on the wall during mobility.      Balance Overall balance assessment: Mild deficits observed, not formally tested                                         ADL either performed or assessed with clinical judgement   ADL Overall ADL's : Modified independent                                             Vision Baseline Vision/History: 1 Wears glasses Patient Visual Report: No change from baseline       Perception Perception Perception: Not tested   Praxis Praxis Praxis: Not tested    Pertinent Vitals/Pain Pain Assessment Pain Assessment: Faces Faces Pain Scale: Hurts little more Pain Location: Incisional Pain Descriptors / Indicators: Aching Pain Intervention(s): Monitored during session     Hand Dominance Right   Extremity/Trunk Assessment Upper Extremity Assessment Upper Extremity Assessment: Overall WFL for tasks assessed   Lower Extremity Assessment Lower Extremity Assessment: Overall WFL for tasks assessed   Cervical / Trunk Assessment Cervical / Trunk Assessment: Back Surgery   Communication Communication Communication: No difficulties   Cognition  Arousal/Alertness: Awake/alert Behavior During Therapy: WFL for tasks assessed/performed Overall Cognitive Status: Within Functional Limits for tasks assessed                                       General Comments   VSS on RA    Exercises     Shoulder Instructions      Home Living Family/patient expects to be discharged to:: Private residence Living Arrangements: Spouse/significant other Available Help at Discharge: Family;Available 24 hours/day Type of Home: House Home Access: Level entry     Home Layout:  One level     Bathroom Shower/Tub: Occupational psychologist: Standard Bathroom Accessibility: Yes How Accessible: Accessible via walker Home Equipment: [place] (2 wheels)          Prior Functioning/Environment Prior Level of Function : Independent/Modified Independent;Driving             Mobility Comments: Use of RW when back/leg pain increased.          OT Problem List: Pain;Impaired balance (sitting and/or standing)      OT Treatment/Interventions:      OT Goals(Current goals can be found in the care plan section) Acute Rehab OT Goals Patient Stated Goal: Return home OT Goal Formulation: With patient Time For Goal Achievement: 07/22/22 Potential to Achieve Goals: Good  OT Frequency:      Co-evaluation              AM-PAC OT "6 Clicks" Daily Activity     Outcome Measure Help from another person eating meals?: None Help from another person taking care of personal grooming?: None Help from another person toileting, which includes using toliet, bedpan, or urinal?: None Help from another person bathing (including washing, rinsing, drying)?: None Help from another person to put on and taking off regular upper body clothing?: None Help from another person to put on and taking off regular lower body clothing?: None 6 Click Score: 24   End of Session Equipment Utilized During Treatment: Gait belt Nurse Communication: Mobility status  Activity Tolerance: Patient tolerated treatment well Patient left: in bed;with call bell/phone within reach;with family/visitor present  OT Visit Diagnosis: Unsteadiness on feet (R26.81)                Time: 0712-1975 OT Time Calculation (min): 27 min Charges:  OT General Charges $OT Visit: 1 Visit OT Evaluation $OT Eval Moderate Complexity: 1 Mod OT Treatments $Self Care/Home Management : 8-22 mins  07/18/2022  RP, OTR/L  Acute Rehabilitation Services  Office:  [phone]   Metta Clines 07/18/2022, 2:00 PM

## 2022-07-18 NOTE — Op Note (Signed)
NAME: Joseph Mejia, Joseph Mejia MEDICAL RECORD NO: [id number] ACCOUNT NO: [id number] DATE OF BIRTH: 1946/10/10 FACILITY: [facility] LOCATION: [facility] PHYSICIAN: Johnn Hai, MD  Operative Report   DATE OF PROCEDURE: 07/18/2022  SURGEON:  Johnn Hai, MD  PREOPERATIVE DIAGNOSES:  Spinal stenosis, HNP L2-L3.  POSTOPERATIVE DIAGNOSES:  Spinal stenosis, HNP, L2-L3, extensive epidural venous plexus.  PROCEDURE PERFORMED: 1.  Central laminectomy L2-L3 with bilateral hemilaminotomies and lateral recess decompression. 2.  Foraminotomies L2-L3, left. 3.  Microdiskectomy L2-L3, left. 4.  Lysis of extensive epidural venous plexus L2-L3, left.  ANESTHESIA:  General.  ASSISTANT:  Lacie Draft, PA.  HISTORY:  A 76 year old with left lower extremity radicular pain at L2-L3 nerve root distribution due to severe spinal stenosis and disk herniation, creating lateral recess stenosis at L2-L3 on the left.  He had weakness in the quadriceps and hip flexors  as well as a positive femoral stretch indicated for decompression of the L2-L3 nerve root.  Risks and benefits discussed including bleeding, infection, damage to neurovascular structures, no change in symptoms, worsening symptoms, DVT, PE, anesthetic  complications, etc.  TECHNIQUE:  The patient in supine position after induction of adequate general anesthesia, a gram of vancomycin and 2 g of Kefzol.  He was placed prone on the Wilson frame.  All bony prominences were well padded.  Lumbar region was prepped and draped in  the usual sterile fashion.  Two 18-gauge spinal needle was utilized to localize the L2-L3 interspace, confirmed with x-ray.  Incision was made from above the spinous process of L2 just below L3.  Subcutaneous tissue was dissected.  Electrocautery was  utilized to achieve hemostasis.  Dorsal lumbar fascia divided in line with skin incision.  Paraspinous muscle elevated from lamina of L2-L3.  Operating microscope was draped and brought in  the surgical field.  Leksell rongeur was utilized to remove the  spinous process of L2 and partial of L3.  Bone wax was placed on the cancellous surfaces.  Hemilaminotomies of the caudad edge of the L2 were performed with a 3 and a 2 mm Kerrison to the point cephalad to detach the ligamentum flavum.  Then, inferior  portion of L3 on the left was partially removed less than 50%.  I identified the superior lamina of L3.  I then with a Woodson retractor developing a plane between the thecal sac and the ligamentum flavum, began removing hypertrophic ligamentum from the  interspace.  This was done central to the right and then to the left caudad performing hemilaminotomies of the cephalad edge of L3.  Noted lateral recess with severe lateral recess stenosis, ligamentum and facet hypertrophy and a large epidural venous  plexus extending out the foramen L3 down the lateral recess and out the foramen of L2.  We meticulously and sequentially cauterized and lysed the epidural venous plexus.  First at the foramen of L3, there was a tethering to the L3 nerve root.  Then,  along the lateral recess a focal HNP was noted as well.  The plexus extending out to the L2 nerve root.  This was cauterized and mobilized, protecting the nerve roots and the thecal sac at all times.  I isolated the disk herniation, performed an  annulotomy.  Copious portion of disk material was removed from the disk space with a micropituitary straight and upbiting.  Further mobilized with an Epstein and catheter lavage.  After full diskectomy of herniated material, there was decompression of  the lateral recess and centrally noted.  I then  obtained a confirmatory radiograph with the [facility] in the disk space of L2-L3.  Copious irrigation with antibiotic irrigation.  Bone wax was placed on the cancellous surfaces.  Woodson probe passed freely  out the foramen of L2 and of L3.  There was 1 cm excursion of L3 root medial pedicle without tension.  There  was no residual disk herniation beneath the thecal sac, axilla or the shoulder of the root or into the foramen.  After this, then I used  thrombin-soaked Gelfoam as well to cauterize the epidural venous plexus. Thrombin-soaked Gelfoam was then removed, no active bleeding.  I placed two very small 2 mm x 2 mm sections of thrombin-soaked Gelfoam just on the lateral recess where the vascular  bed was noted.  After removing McCulloch retractor, irrigated the paraspinous musculature, any minimal bleeding was cauterized with bipolar.  Following this, an inspection revealed no evidence of CSF leakage or active bleeding.  I closed the dorsal  lumbar fascia with 1 Vicryl in interrupted figure-of-eight sutures, subcutaneous with 2-0 and skin with staples, leaving a small aperture distally in case for drainage if it did occur.  Sterile dressing applied.  Placed supine on the hospital bed,  extubated without difficulty and transported to the recovery room in satisfactory condition.  The patient tolerated the procedure well.  No complications.  Assistant is Lacie Draft, [place].  Minimal blood loss of 50 mL.  Assistant was needed for gentle intermittent neural retraction, suction, patient positioning, closure, etc.   NIK D: 07/18/2022 9:29:22 am T: 07/18/2022 10:21:00 am  JOB: [id number]/ [id number]

## 2022-07-18 NOTE — Progress Notes (Signed)
Pharmacy Antibiotic Note  Joseph Mejia is a 76 y.o. male admitted on 07/18/2022 with surgical prophylaxis.  Pharmacy has been consulted for vancomycin dosing if PCN allergic.  Patient is not allergic to PCN, received cefazolin preop. No drain in per RN.  ClCr 28 ml/min.   Plan: Cefazolin 2g x1 at 0600  Height: 5' 5"  (165.1 cm) Weight: 68.9 kg (152 lb) IBW/kg (Calculated) : 61.5  Temp (24hrs), Avg:97.9 F (36.6 C), Min:97.8 F (36.6 C), Max:98 F (36.7 C)  Recent Labs  Lab 07/16/22 1007  WBC 7.5  CREATININE 1.94*    Estimated Creatinine Clearance: 28.6 mL/min (A) (by C-G formula based on SCr of 1.94 mg/dL (H)).    Allergies  Allergen Reactions   Novocain [Procaine] Palpitations    Lightheadedness    Promethazine Other (See Comments)    Family members had hallucinations when taking this, pt prefers to avoid this drug   Amlodipine Swelling    Feet swelling   Aspirin     nose bleeds    Mercaptopurine     REACTION: myalgias   Tape     Tears skins, tolerates cloth and paper tape       Thank you for allowing pharmacy to be a part of this patient's care.  Benetta Spar, PharmD, BCPS, BCCP Clinical Pharmacist  Please check AMION for all [place] phone numbers After 10:00 PM, call [place] [phone]

## 2022-07-18 NOTE — Progress Notes (Signed)
PT Cancellation Note  Patient Details Name: Joseph Mejia MRN: [id number] DOB: March 25, 1946   Cancelled Treatment:    Reason Eval/Treat Not Completed: PT screened, no needs identified, will sign off Per OT, no skilled PT needs. If needs change, please re-consult.   Lou Miner, DPT  Acute Rehabilitation Services  Office: [phone]    Rudean Hitt 07/18/2022, 2:11 PM

## 2022-07-18 NOTE — Plan of Care (Signed)

## 2022-07-18 NOTE — Discharge Instructions (Addendum)
Walk As Tolerated utilizing back precautions.  No bending, twisting, or lifting.  No driving for 2 weeks.   Aquacel dressing may remain in place until follow up. May shower with aquacel dressing in place. If the dressing peels off or becomes saturated, you may remove aquacel dressing and place gauze and tape dressing which should be kept clean and dry and changed daily. Do not remove steri-strips if they are present. See Dr. Tonita Cong in office in 10 to 14 days. Begin taking aspirin 49m every other day starting 6 days post-op, for one week. Walk daily even outside. Use a cane or walker only if necessary. Avoid sitting on soft sofas.

## 2022-07-18 NOTE — Progress Notes (Signed)
Patient transported out of the unit to his vehicle via wheelchair by RN for discharge home; moves all extremities well; in no acute distress nor complaints of pain nor discomfort; incision on his back with Aquacel dressing and is clean, dry and intact; room was checked for all his belongings and was taken by family on the way to their vehicle; discharge instructions concerning his medications, incision care, follow up appointment and when to call the doctor as needed were all discussed with patient and his family by RN and both expressed understanding on the instructions given.

## 2022-07-18 NOTE — Interval H&P Note (Signed)
History and Physical Interval Note:  07/18/2022 7:13 AM  Joseph Mejia  has presented today for surgery, with the diagnosis of Stenosis and herniated disc L2-3.  The various methods of treatment have been discussed with the patient and family. After consideration of risks, benefits and other options for treatment, the patient has consented to  Procedure(s): Central decompression microdiscectomy L2-3 (N/A) as a surgical intervention.  The patient's history has been reviewed, patient examined, no change in status, stable for surgery.  I have reviewed the patient's chart and labs.  Questions were answered to the patient's satisfaction.     Johnn Hai

## 2022-07-18 NOTE — Brief Op Note (Signed)
07/18/2022  7:22 AM  PATIENT:  Joseph Mejia  76 y.o. male  PRE-OPERATIVE DIAGNOSIS:  Stenosis and herniated disc L2-3  POST-OPERATIVE DIAGNOSIS:  * No post-op diagnosis entered *  PROCEDURE:  Procedure(s): Central decompression microdiscectomy L2-3 (N/A)  SURGEON:  Surgeon(s) and Role:    * Susa Day, MD - Primary  PHYSICIAN ASSISTANT:   ASSISTANTS: [place]   ANESTHESIA:   general  EBL:  50   BLOOD ADMINISTERED:none  DRAINS: none   LOCAL MEDICATIONS USED:  MARCAINE     SPECIMEN:  No Specimen  DISPOSITION OF SPECIMEN:  N/A  COUNTS:  YES  TOURNIQUET:  * No tourniquets in log *  DICTATION: .Other Dictation: Dictation Number [id number]  PLAN OF CARE: Admit for overnight observation  PATIENT DISPOSITION:  PACU - hemodynamically stable.   Delay start of Pharmacological VTE agent (>24hrs) due to surgical blood loss or risk of bleeding: yes

## 2022-07-19 ENCOUNTER — Encounter (HOSPITAL_COMMUNITY): Payer: Self-pay | Admitting: Specialist

## 2022-07-19 NOTE — Anesthesia Postprocedure Evaluation (Signed)
Anesthesia Post Note  Patient: Joseph Mejia  Procedure(s) Performed: Central decompression microdiscectomy Lumbsr two-three     Patient location during evaluation: PACU Anesthesia Type: General Level of consciousness: awake and alert Pain management: pain level controlled Vital Signs Assessment: post-procedure vital signs reviewed and stable Respiratory status: spontaneous breathing, nonlabored ventilation, respiratory function stable and patient connected to nasal cannula oxygen Cardiovascular status: blood pressure returned to baseline and stable Postop Assessment: no apparent nausea or vomiting Anesthetic complications: yes   Encounter Notable Events  Notable Event Outcome Phase Comment  Difficult to intubate - expected  Intraprocedure Filed from anesthesia note documentation.    Last Vitals:  Vitals:   07/18/22 1025 07/18/22 1138  BP: (!) 147/86   Pulse: 83 90  Resp: 18 18  Temp: 36.6 C   SpO2: 100% 96%    Last Pain:  Vitals:   07/18/22 1700  TempSrc:   PainSc: 3                  Coumba Kellison L Carlo Guevarra

## 2022-07-24 ENCOUNTER — Other Ambulatory Visit: Payer: Self-pay | Admitting: Internal Medicine

## 2022-08-02 ENCOUNTER — Ambulatory Visit: Payer: Medicare Other | Admitting: Podiatry

## 2022-08-02 DIAGNOSIS — B351 Tinea unguium: Secondary | ICD-10-CM

## 2022-08-02 DIAGNOSIS — B353 Tinea pedis: Secondary | ICD-10-CM | POA: Diagnosis not present

## 2022-08-02 MED ORDER — TERBINAFINE HCL 250 MG PO TABS: 250.0000 mg | ORAL_TABLET | Freq: Every day | ORAL | 0 refills | Status: DC

## 2022-08-02 MED ORDER — KETOCONAZOLE 2 % EX CREA: 1.0000 | TOPICAL_CREAM | Freq: Every day | CUTANEOUS | 2 refills | Status: DC

## 2022-08-02 NOTE — Progress Notes (Signed)
  Subjective:  Patient ID: Joseph Mejia, male    DOB: [birth year],   MRN: [id number]  Chief Complaint  Patient presents with   Nail Problem     Athletes feet    76 y.o. male presents for concern of athletes foot and fungal toenails that have been present for 6 months. Relates itching on the feet. Has been using epsom salts, peroxide and powder on the feet. Relates right is worse than left. Also curious about fungal nail treatments.  Denies any other pedal complaints. Denies n/v/f/c.   Past Medical History:  Diagnosis Date   ADD 02/23/2008   ANXIETY 08/03/2007   ASTHMA 08/03/2007   Asthma    Cancer ([place])    skin cancer    CHEST PAIN 11/17/2008   Closed fracture of four ribs 06/30/2008   DEPRESSION 08/03/2007   ERECTILE DYSFUNCTION, ORGANIC 02/23/2008   Failed moderate sedation during procedure 11/02/2008   GERD 08/03/2007   HAND PAIN, LEFT 06/20/2009   Heart murmur    HIP PAIN, RIGHT 05/27/2008   HYPERLIPIDEMIA 08/03/2007   HYPERTENSION 08/03/2007   Irritable bowel syndrome 11/02/2008   OSTEOARTHRITIS 11/02/2008   OSTEOPENIA 07/26/2008   Pleurisy    RENAL DISEASE, CHRONIC, MILD 11/04/2008   UNIVERSAL ULCERATIVE COLITIS 01/26/2009    Objective:  Physical Exam: Vascular: DP/PT pulses 2/4 bilateral. CFT <3 seconds. Normal hair growth on digits. No edema.  Skin. No lacerations or abrasions bilateral feet. Scaling and erythema noted in interspaces bilateral and along plantar feet bilateral. Nails 1-5 bilateral are thickened and discolored with subungual debris.  Musculoskeletal: MMT 5/5 bilateral lower extremities in DF, PF, Inversion and Eversion. Deceased ROM in DF of ankle joint.  Neurological: Sensation intact to light touch.   Assessment:   1. Onychomycosis   2. Tinea pedis of both feet      Plan:  Patient was evaluated and treated and all questions answered. -Examined patient -Discussed treatment options for painful dystrophic nails  -Reviewed previous  PCP notes and does not appear he has been on lamisil in the past.  -LFTs reviewed from 4/26 and are within normal limits.  -Discussed fungal nail treatment options including oral, topical, and laser treatments.  -Discussed tinea and ketoconazole provided.  -Lamisil prescribed x 90 days.  -Patient to return in 3 months for recheck of nails.    Lorenda Peck, DPM

## 2022-08-19 ENCOUNTER — Ambulatory Visit
Admission: RE | Admit: 2022-08-19 | Discharge: 2022-08-19 | Disposition: A | Discharge status: Home / Self Care | Payer: Medicare Other | Source: Ambulatory Visit | Attending: Nephrology | Admitting: Nephrology

## 2022-08-19 DIAGNOSIS — N189 Chronic kidney disease, unspecified: Secondary | ICD-10-CM | POA: Diagnosis not present

## 2022-08-19 DIAGNOSIS — N281 Cyst of kidney, acquired: Secondary | ICD-10-CM | POA: Diagnosis not present

## 2022-08-26 ENCOUNTER — Ambulatory Visit (INDEPENDENT_AMBULATORY_CARE_PROVIDER_SITE_OTHER): Payer: Medicare Other | Admitting: Family Medicine

## 2022-08-26 ENCOUNTER — Encounter (HOSPITAL_BASED_OUTPATIENT_CLINIC_OR_DEPARTMENT_OTHER): Payer: Self-pay

## 2022-08-26 ENCOUNTER — Ambulatory Visit (HOSPITAL_BASED_OUTPATIENT_CLINIC_OR_DEPARTMENT_OTHER)
Admission: RE | Admit: 2022-08-26 | Discharge: 2022-08-26 | Disposition: A | Discharge status: Home / Self Care | Payer: Medicare Other | Source: Ambulatory Visit | Attending: Family Medicine | Admitting: Family Medicine

## 2022-08-26 VITALS — BP 160/70 | HR 70 | Temp 98.3°F | Wt 155.3 lb

## 2022-08-26 DIAGNOSIS — I6381 Other cerebral infarction due to occlusion or stenosis of small artery: Secondary | ICD-10-CM | POA: Diagnosis not present

## 2022-08-26 DIAGNOSIS — R519 Headache, unspecified: Secondary | ICD-10-CM | POA: Diagnosis not present

## 2022-08-26 DIAGNOSIS — R410 Disorientation, unspecified: Secondary | ICD-10-CM | POA: Diagnosis not present

## 2022-08-26 MED ORDER — PREDNISONE 10 MG PO TABS: ORAL_TABLET | ORAL | 0 refills | Status: DC

## 2022-08-26 NOTE — Progress Notes (Signed)
Established Patient Office Visit  Subjective   Patient ID: Joseph Mejia, male    DOB: [birth year]  Age: 76 y.o. MRN: [id number]  Chief Complaint  Patient presents with   Torticollis    Patient complains of torticollis, x3 days    Headache    Patient complains of headaches, x3 days     HPI   History of [place] has severe headache which started this past Friday.  He states he had recent lumbar surgery July 20.  Denies any significant low back pain currently.  On Friday he went to sneeze 4 times successively.  After the fourth time he had fairly severe onset of headache mostly occipital area radiating up somewhat bilaterally.  No radiculitis symptoms.  Denies any spinal pain in the cervical region.  No nausea or vomiting.  No confusion.  No fever.  No recent tick bites.  Pain in the head and neck region much worse with movement.  Interfering with sleep.  He had been taking gabapentin 300 mg 3 times daily per neurosurgeon and missed couple doses yesterday and running out soon.  He has been taking Robaxin 500 mg 3 times daily since his surgery.  No focal weakness.  No visual changes.  His father died of brain tumor age 22.  Past Medical History:  Diagnosis Date   ADD 02/23/2008   ANXIETY 08/03/2007   ASTHMA 08/03/2007   Asthma    Cancer ([place])    skin cancer    CHEST PAIN 11/17/2008   Closed fracture of four ribs 06/30/2008   DEPRESSION 08/03/2007   ERECTILE DYSFUNCTION, ORGANIC 02/23/2008   Failed moderate sedation during procedure 11/02/2008   GERD 08/03/2007   HAND PAIN, LEFT 06/20/2009   Heart murmur    HIP PAIN, RIGHT 05/27/2008   HYPERLIPIDEMIA 08/03/2007   HYPERTENSION 08/03/2007   Irritable bowel syndrome 11/02/2008   OSTEOARTHRITIS 11/02/2008   OSTEOPENIA 07/26/2008   Pleurisy    RENAL DISEASE, CHRONIC, MILD 11/04/2008   UNIVERSAL ULCERATIVE COLITIS 01/26/2009   Past Surgical History:  Procedure Laterality Date   ANTERIOR FUSION CERVICAL SPINE  12/30/2010    BASAL CELL CARCINOMA EXCISION Left    ear, head   CARPAL TUNNEL RELEASE  12/30/2010   CERVICAL FUSION  04/30/2015   C7   COLONOSCOPY W/ BIOPSIES  06/2001, 08/2006, 07/2008   colon polyp, ulcerative colitis, lymphoid aggregates   ELBOW SURGERY  01/30/2002   left   HAND SURGERY  02/27/2010   basal thumb joint removal and tendon transplant([place])   INCISION AND DRAINAGE PERIRECTAL ABSCESS  12/30/1996   LUMBAR [place] SURGERY  12/30/1990   LUMBAR LAMINECTOMY/DECOMPRESSION MICRODISCECTOMY N/A 07/18/2022   Procedure: Central decompression microdiscectomy Lumbsr two-three;  Surgeon: Susa Day, MD;  Location: [facility];  Service: Orthopedics;  Laterality: N/A;   SHOULDER SURGERY Bilateral 09/30/2007   injury at work, dislocated   TONSILLECTOMY     removed as a child   UPPER GASTROINTESTINAL ENDOSCOPY  06/29/2001   2cm hiatal hernia    reports that he has never smoked. He has never used smokeless tobacco. He reports that he does not currently use alcohol. He reports that he does not use drugs. family history includes COPD in his sister; Cancer in his father; Colon cancer (age of onset: 59) in his father; Heart disease in his brother and mother; Seizures (age of onset: 30) in his brother. Allergies  Allergen Reactions   Novocain [Procaine] Palpitations    Lightheadedness    Promethazine Other (See Comments)  Family members had hallucinations when taking this, pt prefers to avoid this drug   Amlodipine Swelling    Feet swelling   Aspirin     nose bleeds    Mercaptopurine     REACTION: myalgias   Tape     Tears skins, tolerates cloth and paper tape     Review of Systems  Constitutional:  Negative for chills and fever.  Eyes:  Negative for blurred vision and double vision.  Respiratory:  Negative for shortness of breath.   Cardiovascular:  Negative for chest pain.  Gastrointestinal:  Negative for nausea and vomiting.  Neurological:  Positive for headaches. Negative for dizziness,  speech change, focal weakness, seizures, loss of consciousness and weakness.      Objective:     BP (!) 160/70 (BP Location: Left Arm, Patient Position: Sitting, Cuff Size: Normal)   Pulse 70   Temp 98.3 F (36.8 C) (Oral)   Wt 155 lb 4.8 oz (70.4 kg)   SpO2 98%   BMI 25.84 kg/m  BP Readings from Last 3 Encounters:  08/26/22 (!) 160/70  07/18/22 (!) 147/86  07/16/22 (!) 142/74   Wt Readings from Last 3 Encounters:  08/26/22 155 lb 4.8 oz (70.4 kg)  07/18/22 152 lb (68.9 kg)  07/16/22 152 lb 9.6 oz (69.2 kg)      Physical Exam Vitals reviewed.  Constitutional:      Appearance: He is well-developed.  HENT:     Head: Normocephalic and atraumatic.  Neck:     Comments: Very limited range of motion cervical spine secondary to pain.  He has some paracervical muscular tenderness bilaterally.  Some diffuse occipital scalp tenderness. Cardiovascular:     Rate and Rhythm: Normal rate and regular rhythm.  Pulmonary:     Effort: Pulmonary effort is normal.     Breath sounds: Normal breath sounds.  Neurological:     Mental Status: He is alert.     Cranial Nerves: No cranial nerve deficit.     Comments: No focal weakness upper extremities      No results found for any visits on 08/26/22.    The 10-year ASCVD risk score (Arnett DK, et al., 2019) is: 38.2%    Assessment & Plan:   Problem List Items Addressed This Visit   None Visit Diagnoses     Acute intractable headache, unspecified headache type    -  Primary   Relevant Orders   CT HEAD WO CONTRAST (5MM)     Patient relates acute onset of headache and cervical neck pain but especially headache following rapid succession of sneezing on Friday.  Worsening headache since then.  -Check CT head without contrast -If CT normal start prednisone taper and get back on gabapentin 300 mg 3 times daily -We did discuss the fact that if he is coming off gabapentin soon needs to taper off and not stop abruptly  No follow-ups  on file.    Carolann Littler, MD

## 2022-08-26 NOTE — Patient Instructions (Signed)
-  Get back on gabapentin 300 mg 3 times daily  -If CT head unremarkable start prednisone taper  -Consider some topical heat such as heating pad to the neck area

## 2022-09-05 ENCOUNTER — Other Ambulatory Visit: Payer: Self-pay | Admitting: Internal Medicine

## 2022-09-05 DIAGNOSIS — F411 Generalized anxiety disorder: Secondary | ICD-10-CM

## 2022-09-10 DIAGNOSIS — Z7409 Other reduced mobility: Secondary | ICD-10-CM | POA: Diagnosis not present

## 2022-09-10 DIAGNOSIS — M6281 Muscle weakness (generalized): Secondary | ICD-10-CM | POA: Diagnosis not present

## 2022-09-10 DIAGNOSIS — Z9889 Other specified postprocedural states: Secondary | ICD-10-CM | POA: Diagnosis not present

## 2022-09-10 DIAGNOSIS — M549 Dorsalgia, unspecified: Secondary | ICD-10-CM | POA: Diagnosis not present

## 2022-09-15 ENCOUNTER — Other Ambulatory Visit: Payer: Self-pay | Admitting: Internal Medicine

## 2022-09-15 DIAGNOSIS — J3089 Other allergic rhinitis: Secondary | ICD-10-CM

## 2022-09-18 DIAGNOSIS — N1832 Chronic kidney disease, stage 3b: Secondary | ICD-10-CM | POA: Diagnosis not present

## 2022-09-18 LAB — BASIC METABOLIC PANEL
BUN: 20 (ref 4–21)
Chloride: 106 (ref 99–108)
Creatinine: 1.9 — AB (ref 0.6–1.3)
Glucose: 94
Potassium: 5.3 mEq/L — AB (ref 3.5–5.1)
Sodium: 144 (ref 137–147)

## 2022-09-18 LAB — COMPREHENSIVE METABOLIC PANEL
Albumin: 4.3 (ref 3.5–5.0)
Calcium: 10 (ref 8.7–10.7)
eGFR: 36

## 2022-09-18 LAB — CBC AND DIFFERENTIAL
HCT: 42 (ref 41–53)
Hemoglobin: 14 (ref 13.5–17.5)
WBC: 4.7

## 2022-09-24 DIAGNOSIS — M6281 Muscle weakness (generalized): Secondary | ICD-10-CM | POA: Diagnosis not present

## 2022-09-24 DIAGNOSIS — M549 Dorsalgia, unspecified: Secondary | ICD-10-CM | POA: Diagnosis not present

## 2022-09-24 DIAGNOSIS — Z9889 Other specified postprocedural states: Secondary | ICD-10-CM | POA: Diagnosis not present

## 2022-09-24 DIAGNOSIS — Z7409 Other reduced mobility: Secondary | ICD-10-CM | POA: Diagnosis not present

## 2022-09-25 DIAGNOSIS — N2581 Secondary hyperparathyroidism of renal origin: Secondary | ICD-10-CM | POA: Diagnosis not present

## 2022-09-25 DIAGNOSIS — N1832 Chronic kidney disease, stage 3b: Secondary | ICD-10-CM | POA: Diagnosis not present

## 2022-09-25 DIAGNOSIS — D631 Anemia in chronic kidney disease: Secondary | ICD-10-CM | POA: Diagnosis not present

## 2022-09-25 DIAGNOSIS — E875 Hyperkalemia: Secondary | ICD-10-CM | POA: Diagnosis not present

## 2022-09-25 DIAGNOSIS — N281 Cyst of kidney, acquired: Secondary | ICD-10-CM | POA: Diagnosis not present

## 2022-09-25 DIAGNOSIS — I129 Hypertensive chronic kidney disease with stage 1 through stage 4 chronic kidney disease, or unspecified chronic kidney disease: Secondary | ICD-10-CM | POA: Diagnosis not present

## 2022-09-26 DIAGNOSIS — Z7409 Other reduced mobility: Secondary | ICD-10-CM | POA: Diagnosis not present

## 2022-09-26 DIAGNOSIS — M549 Dorsalgia, unspecified: Secondary | ICD-10-CM | POA: Diagnosis not present

## 2022-09-26 DIAGNOSIS — Z9889 Other specified postprocedural states: Secondary | ICD-10-CM | POA: Diagnosis not present

## 2022-09-26 DIAGNOSIS — M6281 Muscle weakness (generalized): Secondary | ICD-10-CM | POA: Diagnosis not present

## 2022-10-01 DIAGNOSIS — M6281 Muscle weakness (generalized): Secondary | ICD-10-CM | POA: Diagnosis not present

## 2022-10-01 DIAGNOSIS — Z9889 Other specified postprocedural states: Secondary | ICD-10-CM | POA: Diagnosis not present

## 2022-10-01 DIAGNOSIS — M549 Dorsalgia, unspecified: Secondary | ICD-10-CM | POA: Diagnosis not present

## 2022-10-01 DIAGNOSIS — Z7409 Other reduced mobility: Secondary | ICD-10-CM | POA: Diagnosis not present

## 2022-10-02 ENCOUNTER — Encounter: Payer: Self-pay | Admitting: Internal Medicine

## 2022-10-07 DIAGNOSIS — R3912 Poor urinary stream: Secondary | ICD-10-CM | POA: Diagnosis not present

## 2022-10-09 ENCOUNTER — Other Ambulatory Visit: Payer: Self-pay | Admitting: Internal Medicine

## 2022-10-10 ENCOUNTER — Telehealth: Payer: Self-pay | Admitting: Internal Medicine

## 2022-10-10 DIAGNOSIS — Z7409 Other reduced mobility: Secondary | ICD-10-CM | POA: Diagnosis not present

## 2022-10-10 DIAGNOSIS — Z9889 Other specified postprocedural states: Secondary | ICD-10-CM | POA: Diagnosis not present

## 2022-10-10 DIAGNOSIS — M6281 Muscle weakness (generalized): Secondary | ICD-10-CM | POA: Diagnosis not present

## 2022-10-10 DIAGNOSIS — M549 Dorsalgia, unspecified: Secondary | ICD-10-CM | POA: Diagnosis not present

## 2022-10-10 NOTE — Telephone Encounter (Signed)
ATC but could not leave vm

## 2022-10-10 NOTE — Telephone Encounter (Signed)
Patient has been scheduled for OV on 10/28/2022

## 2022-10-10 NOTE — Telephone Encounter (Signed)
Pt is calling and need a refill on generic adderall 5 mg  [facility] [id number] - [place], [place] Phone:  [phone]  Fax:  [phone]

## 2022-10-15 ENCOUNTER — Other Ambulatory Visit: Payer: Self-pay | Admitting: Internal Medicine

## 2022-10-15 NOTE — Telephone Encounter (Signed)
Appointment scheduled for 10/28/22

## 2022-10-15 NOTE — Telephone Encounter (Signed)
Pt is calling for a refill on adderall and want it sent to  [facility] [id number] - [place], [place] Phone:  [phone]  Fax:  [phone]

## 2022-10-15 NOTE — Telephone Encounter (Signed)
Patient had an appointment with Dr Elease Hashimoto 08/26/22.  Okay to fill?

## 2022-10-16 MED ORDER — AMPHETAMINE-DEXTROAMPHETAMINE 5 MG PO TABS
5.0000 mg | ORAL_TABLET | Freq: Every day | ORAL | 0 refills | Status: DC
Start: 2022-10-16 — End: 2022-10-28

## 2022-10-16 NOTE — Telephone Encounter (Signed)
Refill sent.

## 2022-10-24 ENCOUNTER — Other Ambulatory Visit: Payer: Self-pay | Admitting: Podiatrist

## 2022-10-24 ENCOUNTER — Ambulatory Visit: Payer: Medicare Other | Admitting: Podiatrist

## 2022-10-24 ENCOUNTER — Ambulatory Visit (INDEPENDENT_AMBULATORY_CARE_PROVIDER_SITE_OTHER): Payer: Medicare Other

## 2022-10-24 ENCOUNTER — Encounter: Payer: Self-pay | Admitting: Podiatrist

## 2022-10-24 DIAGNOSIS — M109 Gout, unspecified: Secondary | ICD-10-CM

## 2022-10-24 DIAGNOSIS — B353 Tinea pedis: Secondary | ICD-10-CM

## 2022-10-24 DIAGNOSIS — B351 Tinea unguium: Secondary | ICD-10-CM

## 2022-10-24 DIAGNOSIS — M79675 Pain in left toe(s): Secondary | ICD-10-CM

## 2022-10-24 DIAGNOSIS — M7989 Other specified soft tissue disorders: Secondary | ICD-10-CM | POA: Diagnosis not present

## 2022-10-24 MED ORDER — KETOCONAZOLE 2 % EX CREA: TOPICAL_CREAM | CUTANEOUS | 2 refills | Status: DC

## 2022-10-24 MED ORDER — COLCHICINE 0.6 MG PO TABS: 0.6000 mg | ORAL_TABLET | Freq: Every day | ORAL | 2 refills | Status: DC

## 2022-10-24 MED ORDER — TRIAMCINOLONE ACETONIDE 10 MG/ML IJ SUSP
10.0000 mg | Freq: Once | INTRAMUSCULAR | Status: AC
  Administered 2022-10-24: 10 mg

## 2022-10-24 NOTE — Progress Notes (Signed)
Chief Complaint  Patient presents with   Nail Problem     Bilateral toe fungus - better since the last visit    Gout     Possible gout left foot 3rd digit      HPI: Patient is 76 y.o. male who presents today for  follow up of fungus on his skin and nails.  He relates he is doing better and the skin on the feet has improved drastically.  He has a new problem and is having pain and a  red, hot swollen left third toe that has become increasingly more painful in the last few days.  He denies any trauma to the toe.    Patient Active Problem List   Diagnosis Date Noted   HNP (herniated nucleus pulposus), lumbar 07/18/2022   Allergic rhinitis 09/09/2017   Asthma 09/09/2017   Long-term current use of steroids 08/10/2011   HAND PAIN, LEFT AND RIGHT 06/20/2009   Chronic universal ulcerative colitis ([facility]) 01/26/2009   CKD (chronic kidney disease) stage 3, GFR 30-59 ml/min (HCC) 11/04/2008   Irritable bowel syndrome 11/02/2008   Osteoarthritis 11/02/2008   FAILED MODERATE SEDATION DURING PROCEDURE 11/02/2008   OSTEOPENIA 07/26/2008   Lumbar degenerative disc disease, moderate spinal stenosis 05/27/2008   Attention deficit disorder 02/23/2008   ERECTILE DYSFUNCTION, ORGANIC 02/23/2008   Dyslipidemia 08/03/2007   Anxiety state 08/03/2007   Depression, recurrent ([place]) 08/03/2007   Essential hypertension 08/03/2007   ASTHMA 08/03/2007   GERD 08/03/2007    Current Outpatient Medications on File Prior to Visit  Medication Sig Dispense Refill   acetaminophen (TYLENOL) 650 MG CR tablet Take 1,300 mg by mouth at bedtime as needed for pain.     ALPRAZolam (XANAX) 0.25 MG tablet TAKE ONE TABLET BY MOUTH EVERY NIGHT AT BEDTIME AS NEEDED FOR ANXIETY AND  TAKE 1/2 TABLET BY MOUTH DAILY IF NEEDED 30 tablet 0   amphetamine-dextroamphetamine (ADDERALL) 5 MG tablet Take 1 tablet (5 mg total) by mouth daily. 30 tablet 0   atorvastatin (LIPITOR) 40 MG tablet TAKE 1 TABLET BY MOUTH DAILY 90 tablet 3    buPROPion (WELLBUTRIN XL) 300 MG 24 hr tablet TAKE 1 TABLET BY MOUTH DAILY 90 tablet 3   Cyanocobalamin (B-12) 3000 MCG CAPS Take 3,000 mcg by mouth daily.     dicyclomine (BENTYL) 20 MG tablet TAKE 1 TABLET BY MOUTH EVERY 6  HOURS (Patient taking differently: Take 20 mg by mouth daily.) 360 tablet 1   fexofenadine (ALLEGRA) 180 MG tablet Take 180 mg by mouth daily as needed for allergies.     FLOVENT HFA 110 MCG/ACT inhaler USE 1 INHALATION BY MOUTH TWICE  DAILY 24 g 3   fluticasone (FLONASE) 50 MCG/ACT nasal spray Place 1 spray into both nostrils daily as needed for allergies or rhinitis.     ketoconazole (NIZORAL) 2 % cream Apply 1 Application topically daily. 60 g 2   lisinopril (ZESTRIL) 10 MG tablet TAKE 1 TABLET BY MOUTH DAILY 90 tablet 3   Multiple Vitamin (MULTIVITAMIN WITH MINERALS) TABS tablet Take 1 tablet by mouth daily.     Olopatadine HCl (PATADAY OP) Place 1 drop into both eyes daily as needed (allergies / irritation).     omeprazole (PRILOSEC) 20 MG capsule TAKE 1 CAPSULE BY MOUTH DAILY 90 capsule 3   polyethylene glycol (MIRALAX / GLYCOLAX) 17 g packet Take 17 g by mouth daily. 14 each 0   predniSONE (DELTASONE) 10 MG tablet Taper as follows: 4-4-4-3-3-2-2-1-1 24 tablet 0  PROAIR HFA 108 (90 Base) MCG/ACT inhaler INHALE 2 PUFFS INTO THE LUNGS 4 TIMES DAILY (Patient taking differently: Inhale 2 puffs into the lungs every 6 (six) hours as needed for wheezing.) 25.5 each 2   Simethicone (GAS-X PO) Take 2 tablets by mouth daily as needed (gas).     terbinafine (LAMISIL) 250 MG tablet Take 1 tablet (250 mg total) by mouth daily. 90 tablet 0   No current facility-administered medications on file prior to visit.    Allergies  Allergen Reactions   Novocain [Procaine] Palpitations    Lightheadedness    Promethazine Other (See Comments)    Family members had hallucinations when taking this, pt prefers to avoid this drug   Amlodipine Swelling    Feet swelling   Aspirin     nose  bleeds    Mercaptopurine     REACTION: myalgias   Tape     Tears skins, tolerates cloth and paper tape     Review of Systems No fevers, chills, nausea, muscle aches, no difficulty breathing, no calf pain, no chest pain or shortness of breath.   Physical Exam  GENERAL APPEARANCE: Alert, conversant. Appropriately groomed. No acute distress.   VASCULAR: Pedal pulses palpable 2/4 DP and  PT bilateral.  Capillary refill time is immediate to all digits,  Proximal to distal cooling is warm to warm.  Digital perfusion adequate.   NEUROLOGIC: sensation is intact to 5.07 monofilament at 5/5 sites bilateral.  Light touch is intact bilateral, vibratory sensation intact bilateral  MUSCULOSKELETAL: acceptable muscle strength, tone and stability bilateral.  Left third toe is swollen, red, painful with pressure.  No streaking is noted.  Toe in rectus alignment and position.  DERMATOLOGIC: skin is warm, supple, and dry.  Skin texture on the plantar foot is improved.  Nails are still discolored and brittle and appear clinically mycotic.  Skin around the third toe was inspected.  No sign of interdigital maceration or entry point for infection.  Nail does not appear infected or ingrown.  X-rays: 3 views of the left third digit toe views are taken.  No sign of fracture, no sign of dislocation is noted.  Swelling of the toe itself is seen.    Assessment   Probable gout left third toe, tinea pedis, onychomycosis  Plan  Discussed exam findings with the patient and his wife.  At today's visit the left third toe is suspicious for gout.  I recommended a steroid injection was is carried out today with 10 mg of Kenalog and Marcaine plain just proximal to the left third toe.  He tolerated this well.  I also recommended an arthritic panel and this was ordered today as well.  A prescription for colchicine was also written and information about a gout diet was dispensed.  I will call with the result of the lab  studies and he will call if the injection fails to relieve his pain.

## 2022-10-24 NOTE — Patient Instructions (Signed)
Information for patients with Gout  Gout defined-Gout occurs when urate crystals accumulate in your joint causing the inflammation and intense pain of gout attack.  Urate crystals can form when you have high levels of uric acid in your blood.  Your body produces uric acid when it breaks down prurines-substances that are found naturally in your body, as well as in certain foods such as organ meats, anchioves, herring, asparagus, and mushrooms.  Normally uric acid dissolves in your blood and passes through your kidneys into your urine.  But sometimes your body either produces too much uric acid or your kidneys excrete too little uric acid.  When this happens, uric acid can build up, forming sharp needle-like urate crystals in a joint or surrounding tissue that cause pain, inflammation and swelling.    Gout is characterized by sudden, severe attacks of pain, redness and tenderness in joints, often the joint at the base of the big toe.  Gout is complex form of arthritis that can affect anyone.  Men are more likely to get gout but women become increasingly more susceptible to gout after menopause.  An acute attack of gout can wake you up in the middle of the night with the sensation that your big toe is on fire.  The affected joint is hot, swollen and so tender that even the weight or the sheet on it may seem intolerable.  If you experience symptoms of an acute gout attack it is important to your doctor as soon as the symptoms start.  Gout that goes untreated can lead to worsening pain and joint damage.  Risk Factors:  You are more likely to develop gout if you have high levels of uric acid in your body.    Factors that increase the uric acid level in your body include:  Lifestyle factors.  Excessive alcohol use-generally more than two drinks a day for men and more than one for women increase the risk of gout.  Medical conditions.  Certain conditions make it more likely that you will develop gout.   These include hypertension, and chronic conditions such as diabetes, high levels of fat and cholesterol in the blood, and narrowing of the arteries.  Certain medications.  The uses of Thiazide diuretics- commonly used to treat hypertension and low dose aspirin can also increase uric acid levels.  Family history of gout.  If other members of your family have had gout, you are more likely to develop the disease.  Age and sex. Gout occurs more often in men than it does in women, primarily because women tend to have lower uric acid levels than men do.  Men are more likely to develop gout earlier usually between the ages of 102-50- whereas women generally develop signs and symptoms after menopause.    Tests and diagnosis:  Tests to help diagnose gout may include:  Blood test.  Your doctor may recommend a blood test to measure the uric acid level in your blood .  Blood tests can be misleading, though.  Some people have high uric acid levels but never experience gout.  And some people have signs and symptoms of gout, but don't have unusual levels of uric acid in their blood.  Joint fluid test.  Your doctor may use a needle to draw fluid from your affected joint.  When examined under the microscope, your joint fluid may reveal urate crystals.  Treatment:  Treatment for gout usually involves medications.  What medications you and your doctor choose will be  based on your current health and other medications you currently take.  Gout medications can be used to treat acute gout attacks and prevent future attacks as well as reduce your risk of complications from gout such as the development of tophi from urate crystal deposits.  Alternative medicine:   Certain foods have been studied for their potential to lower uric acid levels, including:  Coffee.  Studies have found an association between coffee drinking (regular and decaf) and lower uric acid levels.  The evidence is not enough to encourage  non-coffee drinkers to start, but it may give clues to new ways of treating gout in the future.  Vitamin C.  Supplements containing vitamin C may reduce the levels of uric acid in your blood.  However, vitamin as a treatment for gout. Don't assume that if a little vitamin C is good, than lots is better.  Megadoses of vitamin C may increase your bodies uric acid levels.  Cherries.  Cherries have been associated with lower levels of uric acid in studies, but it isn't clear if they have any effect on gout signs and symptoms.  Eating more cherries and other dar-colored fruits, such as blackberries, blueberries, purple grapes and raspberries, may be a safe way to support your gout treatment.    Lifestyle/Diet Recommendations:  Drink 8 to 16 cups ( about 2 to 4 liters) of fluid each day, with at least half being water. Avoid alcohol Eat a moderate amount of protein, preferably from healthy sources, such as low-fat or fat-free dairy, tofu, eggs, and nut butters. Limit you daily intake of meat, fish, and poultry to 4 to 6 ounces. Avoid high fat meats and desserts. Decrease you intake of shellfish, beef, lamb, pork, eggs and cheese. Choose a good source of vitamin C daily such as citrus fruits, strawberries, broccoli,  brussel sprouts, papaya, and cantaloupe.  Choose a good source of vitamin A every other day such as yellow fruits, or dark green/yellow vegetables. Avoid drastic weigh reduction or fasting.  If weigh loss is desired lose it over a period of several months. See "dietary considerations.." chart for specific food recommendations.  Dietary Considerations for people with Gout  Food with negligible purine content (0-15 mg of purine nitrogen per 100 grams food)  May use as desired except on calorie variations  Non fat milk Cocoa Cereals (except in list II) Hard candies  Buttermilk Carbonated drinks Vegetables (except in list II) Sherbet  Coffee Fruits Sugar Honey  Tea Cottage Cheese  Gelatin-jell-o Salt  Fruit juice Breads Angel food Cake   Herbs/spices Jams/Jellies [employer]    Foods that do not contain excessive purine content, but must be limited due to fat content  Cream Eggs Oil and Salad Dressing  Half and Half Peanut Butter Chocolate  Whole Milk Cakes Potato Chips  Butter Ice Cream Fried Foods  Cheese Nuts Waffles, pancakes   List II: Food with moderate purine content (50-150 mg of purine nitrogen per 100 grams of food)  Limit total amount each day to 5 oz. cooked Lean meat, other than those on list III   Poultry, other than those on list III Fish, other than those on list III   Seafood, other than those on list III  These foods may be used occasionally  Peas Lentils Bran  Spinach Oatmeal Dried Beans and Peas  Asparagus Wheat Germ Mushrooms   Additional information about meat choices  Choose fish and poultry, particularly without skin, often.  Select lean, well  trimmed cuts of meat.  Avoid all fatty meats, bacon , sausage, fried meats, fried fish, or poultry, luncheon meats, cold cuts, hot dogs, meats canned or frozen in gravy, spareribs and frozen and packaged prepared meats.   List III: Foods with HIGH purine content / Foods to AVOID (150-800 mg of purine nitrogen per 100 grams of food)  Anchovies Herring Meat Broths  Liver Mackerel Meat Extracts  Kidney Scallops Meat Drippings  Sardines Wild Game Mincemeat  Sweetbreads Goose Gravy  Heart Tongue Yeast, baker's and brewers   Commercial soups made with any of the foods listed in List II or List III  In addition avoid all alcoholic beverages

## 2022-10-25 ENCOUNTER — Ambulatory Visit: Payer: Medicare Other | Admitting: Podiatrist

## 2022-10-25 LAB — SEDIMENTATION RATE: Sed Rate: 4 mm/hr (ref 0–30)

## 2022-10-25 LAB — URIC ACID: Uric Acid: 7.5 mg/dL (ref 3.8–8.4)

## 2022-10-25 LAB — C-REACTIVE PROTEIN: CRP: 16 mg/L — ABNORMAL HIGH (ref 0–10)

## 2022-10-28 ENCOUNTER — Encounter: Payer: Self-pay | Admitting: Internal Medicine

## 2022-10-28 ENCOUNTER — Ambulatory Visit (INDEPENDENT_AMBULATORY_CARE_PROVIDER_SITE_OTHER): Payer: Medicare Other | Admitting: Internal Medicine

## 2022-10-28 VITALS — BP 110/70 | HR 60 | Temp 97.8°F | Wt 148.6 lb

## 2022-10-28 DIAGNOSIS — E559 Vitamin D deficiency, unspecified: Secondary | ICD-10-CM | POA: Diagnosis not present

## 2022-10-28 DIAGNOSIS — F339 Major depressive disorder, recurrent, unspecified: Secondary | ICD-10-CM

## 2022-10-28 DIAGNOSIS — F900 Attention-deficit hyperactivity disorder, predominantly inattentive type: Secondary | ICD-10-CM | POA: Diagnosis not present

## 2022-10-28 DIAGNOSIS — I10 Essential (primary) hypertension: Secondary | ICD-10-CM | POA: Diagnosis not present

## 2022-10-28 LAB — VITAMIN D 25 HYDROXY (VIT D DEFICIENCY, FRACTURES): VITD: 64.6 ng/mL (ref 30.00–100.00)

## 2022-10-28 MED ORDER — AMPHETAMINE-DEXTROAMPHETAMINE 5 MG PO TABS: 5.0000 mg | ORAL_TABLET | Freq: Every day | ORAL | 0 refills | Status: DC

## 2022-10-28 NOTE — Progress Notes (Signed)
Established Patient Office Visit     CC/Reason for Visit: Follow-up chronic conditions  HPI: Joseph Mejia is a 76 y.o. male who is coming in today for the above mentioned reasons. Past Medical History is significant for: ADHD, chronic kidney disease, hypertension, depression, BPH, generalized anxiety disorder.  He saw podiatry for a gout flare and has been treated with a steroid injection and colchicine.  He has started to notice some minor improvement.  He is working on a low purine diet.  He is requesting Adderall refills today as well as his vitamin D levels rechecked.  He is informing me that his nephrologist has taken him off lisinopril.   Past Medical/Surgical History: Past Medical History:  Diagnosis Date   ADD 02/23/2008   ANXIETY 08/03/2007   ASTHMA 08/03/2007   Asthma    Cancer ([place])    skin cancer    CHEST PAIN 11/17/2008   Closed fracture of four ribs 06/30/2008   DEPRESSION 08/03/2007   ERECTILE DYSFUNCTION, ORGANIC 02/23/2008   Failed moderate sedation during procedure 11/02/2008   GERD 08/03/2007   HAND PAIN, LEFT 06/20/2009   Heart murmur    HIP PAIN, RIGHT 05/27/2008   HYPERLIPIDEMIA 08/03/2007   HYPERTENSION 08/03/2007   Irritable bowel syndrome 11/02/2008   OSTEOARTHRITIS 11/02/2008   OSTEOPENIA 07/26/2008   Pleurisy    RENAL DISEASE, CHRONIC, MILD 11/04/2008   UNIVERSAL ULCERATIVE COLITIS 01/26/2009    Past Surgical History:  Procedure Laterality Date   ANTERIOR FUSION CERVICAL SPINE  12/30/2010   BASAL CELL CARCINOMA EXCISION Left    ear, head   CARPAL TUNNEL RELEASE  12/30/2010   CERVICAL FUSION  04/30/2015   C7   COLONOSCOPY W/ BIOPSIES  06/2001, 08/2006, 07/2008   colon polyp, ulcerative colitis, lymphoid aggregates   ELBOW SURGERY  01/30/2002   left   HAND SURGERY  02/27/2010   basal thumb joint removal and tendon transplant([place])   INCISION AND DRAINAGE PERIRECTAL ABSCESS  12/30/1996   LUMBAR [place] SURGERY  12/30/1990    LUMBAR LAMINECTOMY/DECOMPRESSION MICRODISCECTOMY N/A 07/18/2022   Procedure: Central decompression microdiscectomy Lumbsr two-three;  Surgeon: Susa Day, MD;  Location: [facility];  Service: Orthopedics;  Laterality: N/A;   SHOULDER SURGERY Bilateral 09/30/2007   injury at work, dislocated   TONSILLECTOMY     removed as a child   UPPER GASTROINTESTINAL ENDOSCOPY  06/29/2001   2cm hiatal hernia    Social History:  reports that he has never smoked. He has never used smokeless tobacco. He reports that he does not currently use alcohol. He reports that he does not use drugs.  Allergies: Allergies  Allergen Reactions   Novocain [Procaine] Palpitations    Lightheadedness    Promethazine Other (See Comments)    Family members had hallucinations when taking this, pt prefers to avoid this drug   Amlodipine Swelling    Feet swelling   Aspirin     nose bleeds    Mercaptopurine     REACTION: myalgias   Tape     Tears skins, tolerates cloth and paper tape     Family History:  Family History  Problem Relation Age of Onset   Heart disease Mother        s/p CABG age 55   Cancer Father        colon , brain   Colon cancer Father 27   COPD Sister    Heart disease Brother        CAD  Seizures Brother 75   Colon polyps Neg Hx    Esophageal cancer Neg Hx    Rectal cancer Neg Hx    Stomach cancer Neg Hx      Current Outpatient Medications:    acetaminophen (TYLENOL) 650 MG CR tablet, Take 1,300 mg by mouth at bedtime as needed for pain., Disp: , Rfl:    ALPRAZolam (XANAX) 0.25 MG tablet, TAKE ONE TABLET BY MOUTH EVERY NIGHT AT BEDTIME AS NEEDED FOR ANXIETY AND  TAKE 1/2 TABLET BY MOUTH DAILY IF NEEDED, Disp: 30 tablet, Rfl: 0   amphetamine-dextroamphetamine (ADDERALL) 5 MG tablet, Take 1 tablet (5 mg total) by mouth daily., Disp: 30 tablet, Rfl: 0   amphetamine-dextroamphetamine (ADDERALL) 5 MG tablet, Take 1 tablet (5 mg total) by mouth daily., Disp: 30 tablet, Rfl: 0    amphetamine-dextroamphetamine (ADDERALL) 5 MG tablet, Take 1 tablet (5 mg total) by mouth daily., Disp: 30 tablet, Rfl: 0   Ascorbic Acid (VITAMIN C) 100 MG tablet, Take 500 mg by mouth daily., Disp: , Rfl:    atorvastatin (LIPITOR) 40 MG tablet, TAKE 1 TABLET BY MOUTH DAILY, Disp: 90 tablet, Rfl: 3   buPROPion (WELLBUTRIN XL) 300 MG 24 hr tablet, TAKE 1 TABLET BY MOUTH DAILY, Disp: 90 tablet, Rfl: 3   colchicine 0.6 MG tablet, Take 1 tablet (0.6 mg total) by mouth daily. Take 1 - 2 tabs daily for 5-7 days for gout flare., Disp: 30 tablet, Rfl: 2   Cyanocobalamin (B-12) 3000 MCG CAPS, Take 3,000 mcg by mouth daily., Disp: , Rfl:    dicyclomine (BENTYL) 20 MG tablet, TAKE 1 TABLET BY MOUTH EVERY 6  HOURS (Patient taking differently: Take 20 mg by mouth daily.), Disp: 360 tablet, Rfl: 1   fexofenadine (ALLEGRA) 180 MG tablet, Take 180 mg by mouth daily as needed for allergies., Disp: , Rfl:    FLOVENT HFA 110 MCG/ACT inhaler, USE 1 INHALATION BY MOUTH TWICE  DAILY, Disp: 24 g, Rfl: 3   fluticasone (FLONASE) 50 MCG/ACT nasal spray, Place 1 spray into both nostrils daily as needed for allergies or rhinitis., Disp: , Rfl:    ketoconazole (NIZORAL) 2 % cream, Apply 1 Application topically daily., Disp: 60 g, Rfl: 2   ketoconazole (NIZORAL) 2 % cream, Apply to bottom of feet daily x 2-4 weeks, Disp: 60 g, Rfl: 2   Multiple Vitamin (MULTIVITAMIN WITH MINERALS) TABS tablet, Take 1 tablet by mouth daily., Disp: , Rfl:    NON FORMULARY, Cherry juice, Disp: , Rfl:    Olopatadine HCl (PATADAY OP), Place 1 drop into both eyes daily as needed (allergies / irritation)., Disp: , Rfl:    omeprazole (PRILOSEC) 20 MG capsule, TAKE 1 CAPSULE BY MOUTH DAILY, Disp: 90 capsule, Rfl: 3   polyethylene glycol (MIRALAX / GLYCOLAX) 17 g packet, Take 17 g by mouth daily., Disp: 14 each, Rfl: 0   PROAIR HFA 108 (90 Base) MCG/ACT inhaler, INHALE 2 PUFFS INTO THE LUNGS 4 TIMES DAILY (Patient taking differently: Inhale 2 puffs into  the lungs every 6 (six) hours as needed for wheezing.), Disp: 25.5 each, Rfl: 2   Simethicone (GAS-X PO), Take 2 tablets by mouth daily as needed (gas)., Disp: , Rfl:    terbinafine (LAMISIL) 250 MG tablet, Take 1 tablet (250 mg total) by mouth daily., Disp: 90 tablet, Rfl: 0   lisinopril (ZESTRIL) 10 MG tablet, TAKE 1 TABLET BY MOUTH DAILY (Patient not taking: Reported on 10/28/2022), Disp: 90 tablet, Rfl: 3  Review of Systems:  Constitutional: Denies fever, chills, diaphoresis, appetite change and fatigue.  HEENT: Denies photophobia, eye pain, redness, hearing loss, ear pain, congestion, sore throat, rhinorrhea, sneezing, mouth sores, trouble swallowing, neck pain, neck stiffness and tinnitus.   Respiratory: Denies SOB, DOE, cough, chest tightness,  and wheezing.   Cardiovascular: Denies chest pain, palpitations and leg swelling.  Gastrointestinal: Denies nausea, vomiting, abdominal pain, diarrhea, constipation, blood in stool and abdominal distention.  Genitourinary: Denies dysuria, urgency, frequency, hematuria, flank pain and difficulty urinating.  Endocrine: Denies: hot or cold intolerance, sweats, changes in hair or nails, polyuria, polydipsia. Musculoskeletal: Denies myalgias, back pain. Skin: Denies pallor, rash and wound.  Neurological: Denies dizziness, seizures, syncope, weakness, light-headedness, numbness and headaches.  Hematological: Denies adenopathy. Easy bruising, personal or family bleeding history  Psychiatric/Behavioral: Denies suicidal ideation, mood changes, confusion, nervousness, sleep disturbance and agitation    Physical Exam: Vitals:   10/28/22 0907  BP: 110/70  Pulse: 60  Temp: 97.8 F (36.6 C)  TempSrc: Oral  SpO2: 94%  Weight: 148 lb 9.6 oz (67.4 kg)    Body mass index is 24.73 kg/m.   Constitutional: NAD, calm, comfortable Eyes: PERRL, lids and conjunctivae normal ENMT: Mucous membranes are moist.  Respiratory: clear to auscultation  bilaterally, no wheezing, no crackles. Normal respiratory effort. No accessory muscle use.  Cardiovascular: Regular rate and rhythm, no murmurs / rubs / gallops. No extremity edema. Psychiatric: Normal judgment and insight. Alert and oriented x 3. Normal mood.    Impression and Plan:  Essential hypertension  Depression, recurrent ([place])  Attention deficit hyperactivity disorder (ADHD), predominantly inattentive type - Plan: amphetamine-dextroamphetamine (ADDERALL) 5 MG tablet, amphetamine-dextroamphetamine (ADDERALL) 5 MG tablet, amphetamine-dextroamphetamine (ADDERALL) 5 MG tablet  Vitamin D deficiency - Plan: VITAMIN D 25 Hydroxy (Vit-D Deficiency, Fractures)   -PDMP reviewed, no red flags, overdose risk score is 290. -Refill Adderall 5 mg to take 1 tablet daily for total of 30 tablets a month x3 months for treatment of his ADHD. -In regards to his depression, mood is stable. -Blood pressure is well controlled despite discontinuation of lisinopril due to chronic kidney disease. -Patient requesting vitamin D levels rechecked today.  Time spent:31 minutes reviewing chart, interviewing and examining patient and formulating plan of care.       Lelon Frohlich, MD [facility]

## 2022-10-31 ENCOUNTER — Other Ambulatory Visit: Payer: Self-pay | Admitting: Podiatrist

## 2022-10-31 ENCOUNTER — Other Ambulatory Visit: Payer: Self-pay | Admitting: Internal Medicine

## 2022-10-31 DIAGNOSIS — F411 Generalized anxiety disorder: Secondary | ICD-10-CM

## 2022-10-31 MED ORDER — METHYLPREDNISOLONE 4 MG PO TBPK: ORAL_TABLET | ORAL | 1 refills | Status: DC

## 2022-11-04 DIAGNOSIS — Z9889 Other specified postprocedural states: Secondary | ICD-10-CM | POA: Diagnosis not present

## 2022-11-04 DIAGNOSIS — Z7409 Other reduced mobility: Secondary | ICD-10-CM | POA: Diagnosis not present

## 2022-11-04 DIAGNOSIS — M549 Dorsalgia, unspecified: Secondary | ICD-10-CM | POA: Diagnosis not present

## 2022-11-04 DIAGNOSIS — M6281 Muscle weakness (generalized): Secondary | ICD-10-CM | POA: Diagnosis not present

## 2022-11-12 DIAGNOSIS — Z7409 Other reduced mobility: Secondary | ICD-10-CM | POA: Diagnosis not present

## 2022-11-12 DIAGNOSIS — M6281 Muscle weakness (generalized): Secondary | ICD-10-CM | POA: Diagnosis not present

## 2022-11-12 DIAGNOSIS — M549 Dorsalgia, unspecified: Secondary | ICD-10-CM | POA: Diagnosis not present

## 2022-11-12 DIAGNOSIS — Z9889 Other specified postprocedural states: Secondary | ICD-10-CM | POA: Diagnosis not present

## 2022-11-18 ENCOUNTER — Encounter: Payer: Self-pay | Admitting: Internal Medicine

## 2022-11-18 ENCOUNTER — Ambulatory Visit (INDEPENDENT_AMBULATORY_CARE_PROVIDER_SITE_OTHER): Payer: Medicare Other | Admitting: Internal Medicine

## 2022-11-18 VITALS — BP 145/84 | HR 70 | Temp 98.7°F | Wt 149.2 lb

## 2022-11-18 DIAGNOSIS — R051 Acute cough: Secondary | ICD-10-CM | POA: Diagnosis not present

## 2022-11-18 DIAGNOSIS — J069 Acute upper respiratory infection, unspecified: Secondary | ICD-10-CM | POA: Diagnosis not present

## 2022-11-18 LAB — POC COVID19 BINAXNOW: SARS Coronavirus 2 Ag: NEGATIVE

## 2022-11-18 NOTE — Progress Notes (Signed)
Acute office Visit     CC/Reason for Visit: Flulike symptoms  HPI: Joseph Mejia is a 76 y.o. male who is coming in today for the above mentioned reasons.  For the past 5 to 6 days he has been experiencing cough, postnasal drip, nasal congestion and rhinorrhea.  He feels like it has now moved into his chest where he is coughing Whitis sputum.  No sick contacts or recent travel, no body aches.  His Tmax was 99.3.  He has been taking Mucinex and Zyrtec.   Past Medical/Surgical History: Past Medical History:  Diagnosis Date   ADD 02/23/2008   ANXIETY 08/03/2007   ASTHMA 08/03/2007   Asthma    Cancer ([place])    skin cancer    CHEST PAIN 11/17/2008   Closed fracture of four ribs 06/30/2008   DEPRESSION 08/03/2007   ERECTILE DYSFUNCTION, ORGANIC 02/23/2008   Failed moderate sedation during procedure 11/02/2008   GERD 08/03/2007   HAND PAIN, LEFT 06/20/2009   Heart murmur    HIP PAIN, RIGHT 05/27/2008   HYPERLIPIDEMIA 08/03/2007   HYPERTENSION 08/03/2007   Irritable bowel syndrome 11/02/2008   OSTEOARTHRITIS 11/02/2008   OSTEOPENIA 07/26/2008   Pleurisy    RENAL DISEASE, CHRONIC, MILD 11/04/2008   UNIVERSAL ULCERATIVE COLITIS 01/26/2009    Past Surgical History:  Procedure Laterality Date   ANTERIOR FUSION CERVICAL SPINE  12/30/2010   BASAL CELL CARCINOMA EXCISION Left    ear, head   CARPAL TUNNEL RELEASE  12/30/2010   CERVICAL FUSION  04/30/2015   C7   COLONOSCOPY W/ BIOPSIES  06/2001, 08/2006, 07/2008   colon polyp, ulcerative colitis, lymphoid aggregates   ELBOW SURGERY  01/30/2002   left   HAND SURGERY  02/27/2010   basal thumb joint removal and tendon transplant([facility])   INCISION AND DRAINAGE PERIRECTAL ABSCESS  12/30/1996   LUMBAR [place] SURGERY  12/30/1990   LUMBAR LAMINECTOMY/DECOMPRESSION MICRODISCECTOMY N/A 07/18/2022   Procedure: Central decompression microdiscectomy Lumbsr two-three;  Surgeon: Susa Day, MD;  Location: [facility];  Service:  Orthopedics;  Laterality: N/A;   SHOULDER SURGERY Bilateral 09/30/2007   injury at work, dislocated   TONSILLECTOMY     removed as a child   UPPER GASTROINTESTINAL ENDOSCOPY  06/29/2001   2cm hiatal hernia    Social History:  reports that he has never smoked. He has never used smokeless tobacco. He reports that he does not currently use alcohol. He reports that he does not use drugs.  Allergies: Allergies  Allergen Reactions   Novocain [Procaine] Palpitations    Lightheadedness    Promethazine Other (See Comments)    Family members had hallucinations when taking this, pt prefers to avoid this drug   Amlodipine Swelling    Feet swelling   Aspirin     nose bleeds    Mercaptopurine     REACTION: myalgias   Tape     Tears skins, tolerates cloth and paper tape     Family History:  Family History  Problem Relation Age of Onset   Heart disease Mother        s/p CABG age 55   Cancer Father        colon , brain   Colon cancer Father 62   COPD Sister    Heart disease Brother        CAD   Seizures Brother 82   Colon polyps Neg Hx    Esophageal cancer Neg Hx    Rectal cancer Neg Hx  Stomach cancer Neg Hx      Current Outpatient Medications:    acetaminophen (TYLENOL) 650 MG CR tablet, Take 1,300 mg by mouth at bedtime as needed for pain., Disp: , Rfl:    ALPRAZolam (XANAX) 0.25 MG tablet, TAKE ONE TABLET BY MOUTH EVERY NIGHT AT BEDTIME AS NEEDED FOR ANXIETY AND TAKE 1/2 TABLET BY MOUTH DAILY AS NEEDED, Disp: 30 tablet, Rfl: 1   amphetamine-dextroamphetamine (ADDERALL) 5 MG tablet, Take 1 tablet (5 mg total) by mouth daily., Disp: 30 tablet, Rfl: 0   amphetamine-dextroamphetamine (ADDERALL) 5 MG tablet, Take 1 tablet (5 mg total) by mouth daily., Disp: 30 tablet, Rfl: 0   amphetamine-dextroamphetamine (ADDERALL) 5 MG tablet, Take 1 tablet (5 mg total) by mouth daily., Disp: 30 tablet, Rfl: 0   Ascorbic Acid (VITAMIN C) 100 MG tablet, Take 500 mg by mouth daily., Disp: ,  Rfl:    atorvastatin (LIPITOR) 40 MG tablet, TAKE 1 TABLET BY MOUTH DAILY, Disp: 90 tablet, Rfl: 3   buPROPion (WELLBUTRIN XL) 300 MG 24 hr tablet, TAKE 1 TABLET BY MOUTH DAILY, Disp: 90 tablet, Rfl: 3   colchicine 0.6 MG tablet, Take 1 tablet (0.6 mg total) by mouth daily. Take 1 - 2 tabs daily for 5-7 days for gout flare., Disp: 30 tablet, Rfl: 2   Cyanocobalamin (B-12) 3000 MCG CAPS, Take 3,000 mcg by mouth daily., Disp: , Rfl:    dicyclomine (BENTYL) 20 MG tablet, TAKE 1 TABLET BY MOUTH EVERY 6  HOURS (Patient taking differently: Take 20 mg by mouth daily.), Disp: 360 tablet, Rfl: 1   fexofenadine (ALLEGRA) 180 MG tablet, Take 180 mg by mouth daily as needed for allergies., Disp: , Rfl:    FLOVENT HFA 110 MCG/ACT inhaler, USE 1 INHALATION BY MOUTH TWICE  DAILY, Disp: 24 g, Rfl: 3   fluticasone (FLONASE) 50 MCG/ACT nasal spray, Place 1 spray into both nostrils daily as needed for allergies or rhinitis., Disp: , Rfl:    ketoconazole (NIZORAL) 2 % cream, Apply 1 Application topically daily., Disp: 60 g, Rfl: 2   ketoconazole (NIZORAL) 2 % cream, Apply to bottom of feet daily x 2-4 weeks, Disp: 60 g, Rfl: 2   lisinopril (ZESTRIL) 10 MG tablet, TAKE 1 TABLET BY MOUTH DAILY, Disp: 90 tablet, Rfl: 3   Multiple Vitamin (MULTIVITAMIN WITH MINERALS) TABS tablet, Take 1 tablet by mouth daily., Disp: , Rfl:    NON FORMULARY, Cherry juice, Disp: , Rfl:    Olopatadine HCl (PATADAY OP), Place 1 drop into both eyes daily as needed (allergies / irritation)., Disp: , Rfl:    omeprazole (PRILOSEC) 20 MG capsule, TAKE 1 CAPSULE BY MOUTH DAILY, Disp: 90 capsule, Rfl: 3   polyethylene glycol (MIRALAX / GLYCOLAX) 17 g packet, Take 17 g by mouth daily., Disp: 14 each, Rfl: 0   PROAIR HFA 108 (90 Base) MCG/ACT inhaler, INHALE 2 PUFFS INTO THE LUNGS 4 TIMES DAILY (Patient taking differently: Inhale 2 puffs into the lungs every 6 (six) hours as needed for wheezing.), Disp: 25.5 each, Rfl: 2   Simethicone (GAS-X PO), Take  2 tablets by mouth daily as needed (gas)., Disp: , Rfl:    terbinafine (LAMISIL) 250 MG tablet, Take 1 tablet (250 mg total) by mouth daily., Disp: 90 tablet, Rfl: 0  Review of Systems:  Constitutional: Denies fever, chills, diaphoresis, appetite change.  HEENT: Denies photophobia, eye pain, redness, mouth sores, trouble swallowing, neck pain, neck stiffness and tinnitus.   Respiratory: Denies SOB, DOE,  chest  tightness,  and wheezing.   Cardiovascular: Denies chest pain, palpitations and leg swelling.  Gastrointestinal: Denies nausea, vomiting, abdominal pain, diarrhea, constipation, blood in stool and abdominal distention.  Genitourinary: Denies dysuria, urgency, frequency, hematuria, flank pain and difficulty urinating.  Endocrine: Denies: hot or cold intolerance, sweats, changes in hair or nails, polyuria, polydipsia. Musculoskeletal: Denies myalgias, back pain, joint swelling, arthralgias and gait problem.  Skin: Denies pallor, rash and wound.  Neurological: Denies dizziness, seizures, syncope, weakness, light-headedness, numbness and headaches.  Hematological: Denies adenopathy. Easy bruising, personal or family bleeding history  Psychiatric/Behavioral: Denies suicidal ideation, mood changes, confusion, nervousness, sleep disturbance and agitation    Physical Exam: Vitals:   11/18/22 1550 11/18/22 1554  BP: (!) 150/80 (!) 145/84  Pulse: 70   Temp: 98.7 F (37.1 C)   TempSrc: Oral   SpO2: 98%   Weight: 149 lb 3.2 oz (67.7 kg)     Body mass index is 24.83 kg/m.   Constitutional: NAD, calm, comfortable Eyes: PERRL, lids and conjunctivae normal, wears corrective lenses ENMT: Mucous membranes are moist. Posterior pharynx is erythematous but clear of any exudate or lesions. Normal dentition. Tympanic membrane is pearly white, no erythema or bulging. Respiratory: clear to auscultation bilaterally, no wheezing, no crackles. Normal respiratory effort. No accessory muscle use.   Cardiovascular: Regular rate and rhythm, no murmurs / rubs / gallops.  Psychiatric: Normal judgment and insight. Alert and oriented x 3. Normal mood.    Impression and Plan:  Acute cough - Plan: POC COVID-19 BinaxNow  Viral URI with cough  -COVID test negative in office. -Given exam findings, PNA, pharyngitis, ear infection are not likely, hence abx have not been prescribed. -Have advised rest, fluids, OTC antihistamines, cough suppressants and mucinex. -RTC if no improvement in 10-14 days.   Time spent:21 minutes reviewing chart, interviewing and examining patient and formulating plan of care.      Lelon Frohlich, MD [facility]

## 2022-11-19 DIAGNOSIS — Z9889 Other specified postprocedural states: Secondary | ICD-10-CM | POA: Diagnosis not present

## 2022-11-19 DIAGNOSIS — M549 Dorsalgia, unspecified: Secondary | ICD-10-CM | POA: Diagnosis not present

## 2022-11-25 ENCOUNTER — Encounter: Payer: Self-pay | Admitting: Internal Medicine

## 2022-11-26 DIAGNOSIS — Z9889 Other specified postprocedural states: Secondary | ICD-10-CM | POA: Diagnosis not present

## 2022-11-26 DIAGNOSIS — Z7409 Other reduced mobility: Secondary | ICD-10-CM | POA: Diagnosis not present

## 2022-11-26 DIAGNOSIS — M549 Dorsalgia, unspecified: Secondary | ICD-10-CM | POA: Diagnosis not present

## 2022-11-26 DIAGNOSIS — M6281 Muscle weakness (generalized): Secondary | ICD-10-CM | POA: Diagnosis not present

## 2022-11-28 ENCOUNTER — Encounter: Payer: Self-pay | Admitting: Podiatrist

## 2022-11-28 ENCOUNTER — Ambulatory Visit: Payer: Medicare Other | Admitting: Podiatrist

## 2022-11-28 DIAGNOSIS — M109 Gout, unspecified: Secondary | ICD-10-CM

## 2022-11-28 DIAGNOSIS — B351 Tinea unguium: Secondary | ICD-10-CM

## 2022-11-28 DIAGNOSIS — M10072 Idiopathic gout, left ankle and foot: Secondary | ICD-10-CM

## 2022-11-28 MED ORDER — TERBINAFINE HCL 250 MG PO TABS: 250.0000 mg | ORAL_TABLET | Freq: Every day | ORAL | 0 refills | Status: DC

## 2022-11-28 NOTE — Progress Notes (Signed)
Chief Complaint  Patient presents with   Toe Pain    left 3rd toe still red and swollen     HPI: Patient is 76 y.o. male who presents today for continued pain in the left third toe.  He was having a gout attack at the last visit which was 10/24/2022 which is now subsided however redness and swelling still remains in the left third toe at the area of the proximal interphalangeal joint.  He relates most of the pain has gone down except for in a very specific spot on his toe.  He also relates that the toenails have improved however they are still yellowish in discoloration and appear to be growing out.  Wonders if another round of Lamisil would be beneficial.   Allergies  Allergen Reactions   Novocain [Procaine] Palpitations    Lightheadedness    Promethazine Other (See Comments)    Family members had hallucinations when taking this, pt prefers to avoid this drug   Amlodipine Swelling    Feet swelling   Aspirin     nose bleeds    Mercaptopurine     REACTION: myalgias   Tape     Tears skins, tolerates cloth and paper tape     Review of systems is negative except as noted in the HPI.  Denies nausea/ vomiting/ fevers/ chills or night sweats.   Denies difficulty breathing, denies calf pain or tenderness  Physical Exam  Patient is awake, alert, and oriented x 3.  In no acute distress.    Vascular status is intact with palpable pedal pulses DP and PT bilateral and capillary refill time less than 3 seconds bilateral.  No edema or erythema noted.   Neurological exam reveals epicritic and protective sensation grossly intact bilateral.   Dermatological exam reveals redness and swelling at the level of the proximal to phalangeal joint of the left third toe.  No interdigital macerations are noted.  No ingrown toenail is noted.  Toenails themselves are still yellowish discoloration with some proximal clearing noted at about half of the nail plate.  Skin of the feet appears normal.  No peeling's,  no sign of tinea pedis is noted.  Musculoskeletal exam: Discomfort at the left third toe is noted with swelling at the proximal interphalangeal joint noted.  No pain with dorsiflexion plantarflexion of the toe noted.   Assessment: Gout left third toe-swelling slow to resolve Onychomycosis being treated with Lamisil medication  Plan: -Discussed treatment options and alternatives.  We tried a steroid injection of the left third toe at the last visit which failed to relieve the swelling discussed we could try another 1 today and he agreed.  Open skin with alcohol infiltrated 2% lidocaine plain proximal to the left third toe to anesthetize the toe itself I then injected 10 mg of Kenalog into the proximal interphalangeal joint without complication.  A Band-Aid was applied he tolerated this well.  Recommended use of Coban wrap to gently squeeze the toe to help with the swelling. Also agreed to 1 more month of Lamisil.  He will be seen back in 4 to 6 weeks for recheck to see how the toenails are doing and to recheck the toe.  If any problems or concerns arise prior to that visit he will call.

## 2022-12-03 DIAGNOSIS — Z7409 Other reduced mobility: Secondary | ICD-10-CM | POA: Diagnosis not present

## 2022-12-03 DIAGNOSIS — M549 Dorsalgia, unspecified: Secondary | ICD-10-CM | POA: Diagnosis not present

## 2022-12-03 DIAGNOSIS — Z9889 Other specified postprocedural states: Secondary | ICD-10-CM | POA: Diagnosis not present

## 2022-12-03 DIAGNOSIS — M6281 Muscle weakness (generalized): Secondary | ICD-10-CM | POA: Diagnosis not present

## 2022-12-04 DIAGNOSIS — D225 Melanocytic nevi of trunk: Secondary | ICD-10-CM | POA: Diagnosis not present

## 2022-12-04 DIAGNOSIS — L821 Other seborrheic keratosis: Secondary | ICD-10-CM | POA: Diagnosis not present

## 2022-12-04 DIAGNOSIS — L814 Other melanin hyperpigmentation: Secondary | ICD-10-CM | POA: Diagnosis not present

## 2022-12-04 DIAGNOSIS — L578 Other skin changes due to chronic exposure to nonionizing radiation: Secondary | ICD-10-CM | POA: Diagnosis not present

## 2022-12-04 DIAGNOSIS — L570 Actinic keratosis: Secondary | ICD-10-CM | POA: Diagnosis not present

## 2023-01-02 ENCOUNTER — Encounter: Payer: Self-pay | Admitting: Podiatry

## 2023-01-02 ENCOUNTER — Ambulatory Visit: Payer: Medicare Other | Admitting: Podiatry

## 2023-01-02 DIAGNOSIS — M109 Gout, unspecified: Secondary | ICD-10-CM

## 2023-01-02 DIAGNOSIS — B351 Tinea unguium: Secondary | ICD-10-CM

## 2023-01-02 MED ORDER — TERBINAFINE HCL 250 MG PO TABS: 250.0000 mg | ORAL_TABLET | Freq: Every day | ORAL | 0 refills | Status: AC

## 2023-01-02 NOTE — Progress Notes (Signed)
  Subjective:  Patient ID: Joseph Mejia, male    DOB: 1946-09-04,   MRN: [id number]  Chief Complaint  Patient presents with   Gout    Left foot third toe inflamed    Nail Problem    Bilateral nail fungus    77 y.o. male presents for follow-up of fungal nails as well as gout flare that was treated by Dr. Valentina Lucks. Relates he is doing well he did have another flare at the end of December but took colchicine and resolved without issues. States the toes and fungus are improving but still has some discoloration present.  Relates itching on the feet. Has been using epsom salts, peroxide and powder on the feet. Relates right is worse than left. Also curious about fungal nail treatments.  Denies any other pedal complaints. Denies n/v/f/c.   Past Medical History:  Diagnosis Date   ADD 02/23/2008   ANXIETY 08/03/2007   ASTHMA 08/03/2007   Asthma    Cancer ([place])    skin cancer    CHEST PAIN 11/17/2008   Closed fracture of four ribs 06/30/2008   DEPRESSION 08/03/2007   ERECTILE DYSFUNCTION, ORGANIC 02/23/2008   Failed moderate sedation during procedure 11/02/2008   GERD 08/03/2007   HAND PAIN, LEFT 06/20/2009   Heart murmur    HIP PAIN, RIGHT 05/27/2008   HYPERLIPIDEMIA 08/03/2007   HYPERTENSION 08/03/2007   Irritable bowel syndrome 11/02/2008   OSTEOARTHRITIS 11/02/2008   OSTEOPENIA 07/26/2008   Pleurisy    RENAL DISEASE, CHRONIC, MILD 11/04/2008   UNIVERSAL ULCERATIVE COLITIS 01/26/2009    Objective:  Physical Exam: Vascular: DP/PT pulses 2/4 bilateral. CFT <3 seconds. Normal hair growth on digits. No edema.  Skin. No lacerations or abrasions bilateral feet. Scaling and erythema noted in interspaces bilateral and along plantar feet bilateral. Nails 1-5 bilateral are thickened and discolored with subungual debris. Proximal nails are coming in normal and healthy.  Musculoskeletal: MMT 5/5 bilateral lower extremities in DF, PF, Inversion and Eversion. Deceased ROM in DF of  ankle joint.  Neurological: Sensation intact to light touch.   Assessment:   1. Onychomycosis   2. Acute gout involving toe of left foot, unspecified cause       Plan:  Patient was evaluated and treated and all questions answered. -Examined patient -Discussed treatment options for painful dystrophic nails  -Discussed fungal nail treatment options including oral, topical, and laser treatments.  -Discussed tinea and ketoconazole provided.  -Will do one more month of lamisil and then will take a break. Lamisil sent to pharmacy x 30 days.  -Advised on gout prevention and treaments. Discussed diet and lifestyle changes. Continue with colchicine when toe flares and will return as needed for any issues with this.  -Patient to return in 6 weeks for recheck of fungal nails. Lorenda Peck, DPM

## 2023-01-20 DIAGNOSIS — N1832 Chronic kidney disease, stage 3b: Secondary | ICD-10-CM | POA: Diagnosis not present

## 2023-01-27 DIAGNOSIS — D631 Anemia in chronic kidney disease: Secondary | ICD-10-CM | POA: Diagnosis not present

## 2023-01-27 DIAGNOSIS — R809 Proteinuria, unspecified: Secondary | ICD-10-CM | POA: Diagnosis not present

## 2023-01-27 DIAGNOSIS — I129 Hypertensive chronic kidney disease with stage 1 through stage 4 chronic kidney disease, or unspecified chronic kidney disease: Secondary | ICD-10-CM | POA: Diagnosis not present

## 2023-01-27 DIAGNOSIS — N1832 Chronic kidney disease, stage 3b: Secondary | ICD-10-CM | POA: Diagnosis not present

## 2023-01-27 DIAGNOSIS — N2581 Secondary hyperparathyroidism of renal origin: Secondary | ICD-10-CM | POA: Diagnosis not present

## 2023-02-13 ENCOUNTER — Encounter: Payer: Self-pay | Admitting: Podiatry

## 2023-02-13 ENCOUNTER — Ambulatory Visit: Payer: Medicare Other | Admitting: Podiatry

## 2023-02-13 DIAGNOSIS — B351 Tinea unguium: Secondary | ICD-10-CM | POA: Diagnosis not present

## 2023-02-13 DIAGNOSIS — B353 Tinea pedis: Secondary | ICD-10-CM

## 2023-02-13 DIAGNOSIS — M109 Gout, unspecified: Secondary | ICD-10-CM

## 2023-02-13 MED ORDER — COLCHICINE 0.6 MG PO TABS: 0.6000 mg | ORAL_TABLET | Freq: Every day | ORAL | 2 refills | Status: DC

## 2023-02-13 NOTE — Progress Notes (Signed)
  Subjective:  Patient ID: Joseph Mejia, male    DOB: Dec 04, 1946,   MRN: [id number]  Chief Complaint  Patient presents with   Gout    Patient states he had gout flare up in the right great toe that has now gotten better and states the flare up in the left 3rd digit is not better,   Nail Problem    Patient states his fungus is getting better since the last visit     77 y.o. male presents for follow-up of fungal nails as well as gout flare. Relates he finished out the Lamisil and doing well. States in the meantime since last visit he has had another flare. He took the colchicine and that did help.   Denies any other pedal complaints. Denies n/v/f/c.   Past Medical History:  Diagnosis Date   ADD 02/23/2008   ANXIETY 08/03/2007   ASTHMA 08/03/2007   Asthma    Cancer ([place])    skin cancer    CHEST PAIN 11/17/2008   Closed fracture of four ribs 06/30/2008   DEPRESSION 08/03/2007   ERECTILE DYSFUNCTION, ORGANIC 02/23/2008   Failed moderate sedation during procedure 11/02/2008   GERD 08/03/2007   HAND PAIN, LEFT 06/20/2009   Heart murmur    HIP PAIN, RIGHT 05/27/2008   HYPERLIPIDEMIA 08/03/2007   HYPERTENSION 08/03/2007   Irritable bowel syndrome 11/02/2008   OSTEOARTHRITIS 11/02/2008   OSTEOPENIA 07/26/2008   Pleurisy    RENAL DISEASE, CHRONIC, MILD 11/04/2008   UNIVERSAL ULCERATIVE COLITIS 01/26/2009    Objective:  Physical Exam: Vascular: DP/PT pulses 2/4 bilateral. CFT <3 seconds. Normal hair growth on digits. No edema.  Skin. No lacerations or abrasions bilateral feet. Scaling and erythema noted in interspaces bilateral and along plantar feet bilateral improved.  Nails 1-5 bilateral are thickened and discolored with subungual debris. Proximal nails are coming in normal and healthy about 50%.  Musculoskeletal: MMT 5/5 bilateral lower extremities in DF, PF, Inversion and Eversion. Deceased ROM in DF of ankle joint.  Neurological: Sensation intact to light touch.    Assessment:   1. Onychomycosis   2. Tinea pedis of both feet   3. Acute gout involving toe of left foot, unspecified cause        Plan:  Patient was evaluated and treated and all questions answered. -Examined patient -Discussed treatment options for painful dystrophic nails  -Discussed fungal nail treatment options including oral, topical, and laser treatments.  -Discussed tinea improving, may continue with ketoconazole . -Will hold off on lamisil today.   -Advised on gout prevention and treaments. Discussed diet and lifestyle changes. Continue with colchicine when toe flares and will return as needed for any issues with this. Refill of colchicine provided. Discussed following up with PCP for possible long term treatment with allopurinol as he has had multiple flares in the past few months.  -Patient to return in 3 months to reassess fungal nails.    Lorenda Peck, DPM

## 2023-02-14 ENCOUNTER — Telehealth: Payer: Self-pay

## 2023-02-14 ENCOUNTER — Telehealth: Payer: Self-pay | Admitting: Internal Medicine

## 2023-02-14 ENCOUNTER — Other Ambulatory Visit: Payer: Self-pay | Admitting: Podiatry

## 2023-02-14 DIAGNOSIS — F900 Attention-deficit hyperactivity disorder, predominantly inattentive type: Secondary | ICD-10-CM

## 2023-02-14 MED ORDER — COLCHICINE 0.6 MG PO TABS: ORAL_TABLET | ORAL | 2 refills | Status: DC

## 2023-02-14 MED ORDER — AMPHETAMINE-DEXTROAMPHETAMINE 5 MG PO TABS: 5.0000 mg | ORAL_TABLET | Freq: Every day | ORAL | 0 refills | Status: DC

## 2023-02-14 NOTE — Telephone Encounter (Signed)
Pt called to request a refill of the following:   amphetamine-dextroamphetamine (ADDERALL) 5 MG tablet   Pt states he is completely out and has been out for a few days.  Pt was advised that MD is OOO on Fridays.  Pt asked if there was any other provider that could help.   LOV:  11/18/22  Please advise.   [facility] [id number] - [place] Phone: [phone]  Fax: [phone]

## 2023-02-14 NOTE — Telephone Encounter (Signed)
Noted, thanks!

## 2023-02-14 NOTE — Telephone Encounter (Signed)
Left a detailed message with the information below at the patient's cell number. ?

## 2023-02-26 ENCOUNTER — Other Ambulatory Visit: Payer: Self-pay | Admitting: Internal Medicine

## 2023-02-26 DIAGNOSIS — F411 Generalized anxiety disorder: Secondary | ICD-10-CM

## 2023-02-26 MED ORDER — ALPRAZOLAM 0.25 MG PO TABS: ORAL_TABLET | ORAL | 1 refills | Status: DC

## 2023-02-26 NOTE — Telephone Encounter (Addendum)
Prescription Request  02/26/2023  Is this a "Controlled Substance" medicine? Yes  LOV: 11/18/2022  What is the name of the medication or equipment? ALPRAZolam (XANAX) 0.25 MG tablet   Have you contacted your pharmacy to request a refill? Yes   Pt states he is completely out of this medication.  [facility] [id number] - [place], [place] Phone: [phone]  Fax: [phone]       Patient notified that their request is being sent to the clinical staff for review and that they should receive a response within 2 business days.   Please advise at Mobile [phone] (mobile)

## 2023-03-11 ENCOUNTER — Telehealth: Payer: Self-pay | Admitting: Internal Medicine

## 2023-03-11 NOTE — Telephone Encounter (Signed)
Contacted [facility] to schedule their annual wellness visit. Call back at later date: wcb  to schedule   [facility] direct phone # [phone]   Patient called to schedule [facility].  I wanted to transfer to Office for them to schedule w/Dr Jerilee Hoh.  Patient he was not at home and would call back to schedule

## 2023-03-26 ENCOUNTER — Other Ambulatory Visit: Payer: Self-pay | Admitting: Internal Medicine

## 2023-03-26 DIAGNOSIS — F900 Attention-deficit hyperactivity disorder, predominantly inattentive type: Secondary | ICD-10-CM

## 2023-03-26 MED ORDER — AMPHETAMINE-DEXTROAMPHETAMINE 5 MG PO TABS: 5.0000 mg | ORAL_TABLET | Freq: Every day | ORAL | 0 refills | Status: DC

## 2023-03-26 NOTE — Telephone Encounter (Signed)
Refill amphetamine-dextroamphetamine (ADDERALL) 5 MG tablet   [facility] [id number] - [place], [place] Phone: [phone]  Fax: [phone]

## 2023-03-27 NOTE — Telephone Encounter (Signed)
Refill sent.

## 2023-04-03 ENCOUNTER — Encounter: Payer: Self-pay | Admitting: Internal Medicine

## 2023-04-03 ENCOUNTER — Ambulatory Visit (INDEPENDENT_AMBULATORY_CARE_PROVIDER_SITE_OTHER): Payer: Medicare Other | Admitting: Internal Medicine

## 2023-04-03 ENCOUNTER — Ambulatory Visit: Payer: Medicare Other | Admitting: Internal Medicine

## 2023-04-03 VITALS — BP 130/80 | HR 73 | Temp 97.5°F | Wt 147.0 lb

## 2023-04-03 DIAGNOSIS — F900 Attention-deficit hyperactivity disorder, predominantly inattentive type: Secondary | ICD-10-CM | POA: Diagnosis not present

## 2023-04-03 DIAGNOSIS — J4520 Mild intermittent asthma, uncomplicated: Secondary | ICD-10-CM | POA: Diagnosis not present

## 2023-04-03 DIAGNOSIS — M1A9XX0 Chronic gout, unspecified, without tophus (tophi): Secondary | ICD-10-CM

## 2023-04-03 DIAGNOSIS — F411 Generalized anxiety disorder: Secondary | ICD-10-CM | POA: Diagnosis not present

## 2023-04-03 DIAGNOSIS — I10 Essential (primary) hypertension: Secondary | ICD-10-CM

## 2023-04-03 MED ORDER — ALLOPURINOL 300 MG PO TABS
300.0000 mg | ORAL_TABLET | Freq: Every day | ORAL | 1 refills | Status: DC
Start: 2023-04-03 — End: 2023-09-22

## 2023-04-03 MED ORDER — ARNUITY ELLIPTA 50 MCG/ACT IN AEPB: 1.0000 | INHALATION_SPRAY | Freq: Every day | RESPIRATORY_TRACT | 1 refills | Status: DC

## 2023-04-03 MED ORDER — AMLODIPINE BESYLATE 5 MG PO TABS: 5.0000 mg | ORAL_TABLET | Freq: Every day | ORAL | 1 refills | Status: DC

## 2023-04-03 MED ORDER — AMPHETAMINE-DEXTROAMPHETAMINE 5 MG PO TABS: 5.0000 mg | ORAL_TABLET | Freq: Every day | ORAL | 0 refills | Status: DC

## 2023-04-03 MED ORDER — ARNUITY ELLIPTA 50 MCG/ACT IN AEPB
1.0000 | INHALATION_SPRAY | Freq: Every day | RESPIRATORY_TRACT | 1 refills | Status: DC
Start: 2023-04-03 — End: 2023-04-03

## 2023-04-03 MED ORDER — ALPRAZOLAM 0.25 MG PO TABS: ORAL_TABLET | ORAL | 1 refills | Status: DC

## 2023-04-03 NOTE — Progress Notes (Signed)
Established Patient Office Visit     CC/Reason for Visit: Medication refills, discuss chronic medication for gout  HPI: Joseph Mejia is a 77 y.o. male who is coming in today for the above mentioned reasons.  He has a history of ADHD and anxiety and is needing refills of Adderall and alprazolam.  His insurance will no longer cover Flovent.  He is requesting a prescription for Arnuity Ellipta.  The past 6 months he has had 3 distinct gout flareups, he would like to discuss chronic preventive medication.   Past Medical/Surgical History: Past Medical History:  Diagnosis Date   ADD 02/23/2008   ANXIETY 08/03/2007   ASTHMA 08/03/2007   Asthma    Cancer    skin cancer    CHEST PAIN 11/17/2008   Closed fracture of four ribs 06/30/2008   DEPRESSION 08/03/2007   ERECTILE DYSFUNCTION, ORGANIC 02/23/2008   Failed moderate sedation during procedure 11/02/2008   GERD 08/03/2007   HAND PAIN, LEFT 06/20/2009   Heart murmur    HIP PAIN, RIGHT 05/27/2008   HYPERLIPIDEMIA 08/03/2007   HYPERTENSION 08/03/2007   Irritable bowel syndrome 11/02/2008   OSTEOARTHRITIS 11/02/2008   OSTEOPENIA 07/26/2008   Pleurisy    RENAL DISEASE, CHRONIC, MILD 11/04/2008   UNIVERSAL ULCERATIVE COLITIS 01/26/2009    Past Surgical History:  Procedure Laterality Date   ANTERIOR FUSION CERVICAL SPINE  12/30/2010   BASAL CELL CARCINOMA EXCISION Left    ear, head   CARPAL TUNNEL RELEASE  12/30/2010   CERVICAL FUSION  04/30/2015   C7   COLONOSCOPY W/ BIOPSIES  06/2001, 08/2006, 07/2008   colon polyp, ulcerative colitis, lymphoid aggregates   ELBOW SURGERY  01/30/2002   left   HAND SURGERY  02/27/2010   basal thumb joint removal and tendon transplant([facility])   INCISION AND DRAINAGE PERIRECTAL ABSCESS  12/30/1996   LUMBAR [place] SURGERY  12/30/1990   LUMBAR LAMINECTOMY/DECOMPRESSION MICRODISCECTOMY N/A 07/18/2022   Procedure: Central decompression microdiscectomy Lumbsr two-three;  Surgeon: Susa Day, MD;  Location: [facility];  Service: Orthopedics;  Laterality: N/A;   SHOULDER SURGERY Bilateral 09/30/2007   injury at work, dislocated   TONSILLECTOMY     removed as a child   UPPER GASTROINTESTINAL ENDOSCOPY  06/29/2001   2cm hiatal hernia    Social History:  reports that he has never smoked. He has never used smokeless tobacco. He reports that he does not currently use alcohol. He reports that he does not use drugs.  Allergies: Allergies  Allergen Reactions   Novocain [Procaine] Palpitations    Lightheadedness    Promethazine Other (See Comments)    Family members had hallucinations when taking this, pt prefers to avoid this drug   Amlodipine Swelling    Feet swelling   Aspirin     nose bleeds    Mercaptopurine     REACTION: myalgias   Tape     Tears skins, tolerates cloth and paper tape     Family History:  Family History  Problem Relation Age of Onset   Heart disease Mother        s/p CABG age 62   Cancer Father        colon , brain   Colon cancer Father 9   COPD Sister    Heart disease Brother        CAD   Seizures Brother 72   Colon polyps Neg Hx    Esophageal cancer Neg Hx    Rectal cancer Neg Hx  Stomach cancer Neg Hx      Current Outpatient Medications:    acetaminophen (TYLENOL) 650 MG CR tablet, Take 1,300 mg by mouth at bedtime as needed for pain., Disp: , Rfl:    allopurinol (ZYLOPRIM) 300 MG tablet, Take 1 tablet (300 mg total) by mouth daily., Disp: 90 tablet, Rfl: 1   Ascorbic Acid (VITAMIN C) 100 MG tablet, Take 500 mg by mouth daily., Disp: , Rfl:    atorvastatin (LIPITOR) 40 MG tablet, TAKE 1 TABLET BY MOUTH DAILY, Disp: 90 tablet, Rfl: 3   buPROPion (WELLBUTRIN XL) 300 MG 24 hr tablet, TAKE 1 TABLET BY MOUTH DAILY, Disp: 90 tablet, Rfl: 3   colchicine 0.6 MG tablet, Take 1-2 tablets daily  as needed for gout flare, Disp: 30 tablet, Rfl: 2   Cyanocobalamin (B-12) 3000 MCG CAPS, Take 3,000 mcg by mouth daily., Disp: , Rfl:     dicyclomine (BENTYL) 20 MG tablet, TAKE 1 TABLET BY MOUTH EVERY 6  HOURS (Patient taking differently: Take 20 mg by mouth daily.), Disp: 360 tablet, Rfl: 1   fexofenadine (ALLEGRA) 180 MG tablet, Take 180 mg by mouth daily as needed for allergies., Disp: , Rfl:    fluticasone (FLONASE) 50 MCG/ACT nasal spray, Place 1 spray into both nostrils daily as needed for allergies or rhinitis., Disp: , Rfl:    ketoconazole (NIZORAL) 2 % cream, Apply 1 Application topically daily., Disp: 60 g, Rfl: 2   Multiple Vitamin (MULTIVITAMIN WITH MINERALS) TABS tablet, Take 1 tablet by mouth daily., Disp: , Rfl:    NON FORMULARY, Cherry juice, Disp: , Rfl:    Olopatadine HCl (PATADAY OP), Place 1 drop into both eyes daily as needed (allergies / irritation)., Disp: , Rfl:    omeprazole (PRILOSEC) 20 MG capsule, TAKE 1 CAPSULE BY MOUTH DAILY, Disp: 90 capsule, Rfl: 3   polyethylene glycol (MIRALAX / GLYCOLAX) 17 g packet, Take 17 g by mouth daily., Disp: 14 each, Rfl: 0   PROAIR HFA 108 (90 Base) MCG/ACT inhaler, INHALE 2 PUFFS INTO THE LUNGS 4 TIMES DAILY (Patient taking differently: Inhale 2 puffs into the lungs every 6 (six) hours as needed for wheezing.), Disp: 25.5 each, Rfl: 2   Simethicone (GAS-X PO), Take 2 tablets by mouth daily as needed (gas)., Disp: , Rfl:    ALPRAZolam (XANAX) 0.25 MG tablet, TAKE 1/2 TABLET BY MOUTH DAILY AS NEEDED, Disp: 30 tablet, Rfl: 1   amLODipine (NORVASC) 5 MG tablet, Take 1 tablet (5 mg total) by mouth daily., Disp: 90 tablet, Rfl: 1   amphetamine-dextroamphetamine (ADDERALL) 5 MG tablet, Take 1 tablet (5 mg total) by mouth daily., Disp: 30 tablet, Rfl: 0   amphetamine-dextroamphetamine (ADDERALL) 5 MG tablet, Take 1 tablet (5 mg total) by mouth daily., Disp: 30 tablet, Rfl: 0   amphetamine-dextroamphetamine (ADDERALL) 5 MG tablet, Take 1 tablet (5 mg total) by mouth daily., Disp: 30 tablet, Rfl: 0   Fluticasone Furoate (ARNUITY ELLIPTA) 50 MCG/ACT AEPB, Inhale 1 puff into the lungs  daily., Disp: 90 each, Rfl: 1   terbinafine (LAMISIL) 250 MG tablet, Take 1 tablet (250 mg total) by mouth daily. (Patient not taking: Reported on 04/03/2023), Disp: 30 tablet, Rfl: 0  Review of Systems:  Negative unless indicated in HPI.   Physical Exam: Vitals:   04/03/23 1500  BP: 130/80  Pulse: 73  Temp: (!) 97.5 F (36.4 C)  TempSrc: Oral  SpO2: 97%  Weight: 147 lb (66.7 kg)    Body mass index is 24.46  kg/m.   Physical Exam Vitals reviewed.  Constitutional:      Appearance: Normal appearance.  HENT:     Head: Normocephalic and atraumatic.  Eyes:     Conjunctiva/sclera: Conjunctivae normal.     Pupils: Pupils are equal, round, and reactive to light.  Cardiovascular:     Rate and Rhythm: Normal rate and regular rhythm.  Pulmonary:     Effort: Pulmonary effort is normal.     Breath sounds: Normal breath sounds.  Skin:    General: Skin is warm and dry.  Neurological:     General: No focal deficit present.     Mental Status: He is alert and oriented to person, place, and time.  Psychiatric:        Mood and Affect: Mood normal.        Behavior: Behavior normal.        Thought Content: Thought content normal.        Judgment: Judgment normal.      Impression and Plan:  Mild intermittent asthma without complication - Plan: Fluticasone Furoate (ARNUITY ELLIPTA) 50 MCG/ACT AEPB, DISCONTINUED: Fluticasone Furoate (ARNUITY ELLIPTA) 50 MCG/ACT AEPB  Attention deficit hyperactivity disorder (ADHD), predominantly inattentive type - Plan: amphetamine-dextroamphetamine (ADDERALL) 5 MG tablet, amphetamine-dextroamphetamine (ADDERALL) 5 MG tablet, amphetamine-dextroamphetamine (ADDERALL) 5 MG tablet  Anxiety state - Plan: ALPRAZolam (XANAX) 0.25 MG tablet  Essential hypertension - Plan: amLODipine (NORVASC) 5 MG tablet  Chronic gout without tophus, unspecified cause, unspecified site - Plan: allopurinol (ZYLOPRIM) 300 MG tablet  -PDMP reviewed, no red flags, overdose  risk score is 90. -Refill Adderall 5 mg to take 1 tablet daily for total of 30 tablets a month x 3 months.  This is for treatment of ADHD. -For his anxiety I will refill alprazolam 0.25 mg to take half a tablet as needed. -His insurance will no longer be covering Flovent, switched over to [employer]. -Blood pressure is well-controlled, refill amlodipine. -Start allopurinol for chronic gout prevention.  Time spent:34 minutes reviewing chart, interviewing and examining patient and formulating plan of care.     Lelon Frohlich, MD [facility]

## 2023-04-09 DIAGNOSIS — R3912 Poor urinary stream: Secondary | ICD-10-CM | POA: Diagnosis not present

## 2023-05-15 ENCOUNTER — Ambulatory Visit: Payer: Medicare Other | Admitting: Podiatry

## 2023-05-15 ENCOUNTER — Encounter: Payer: Self-pay | Admitting: Podiatry

## 2023-05-15 DIAGNOSIS — B353 Tinea pedis: Secondary | ICD-10-CM | POA: Diagnosis not present

## 2023-05-15 DIAGNOSIS — B351 Tinea unguium: Secondary | ICD-10-CM

## 2023-05-15 DIAGNOSIS — M1A0710 Idiopathic chronic gout, right ankle and foot, without tophus (tophi): Secondary | ICD-10-CM

## 2023-05-15 MED ORDER — KETOCONAZOLE 2 % EX CREA: 1.0000 | TOPICAL_CREAM | Freq: Every day | CUTANEOUS | 2 refills | Status: DC

## 2023-05-15 MED ORDER — COLCHICINE 0.6 MG PO TABS: ORAL_TABLET | ORAL | 2 refills | Status: DC

## 2023-05-15 NOTE — Progress Notes (Signed)
  Subjective:  Patient ID: Joseph Mejia, male    DOB: 12-12-1946,   MRN: [id number]  Chief Complaint  Patient presents with   Nail Problem    Patient came in today for bilateral nail fungus and gout follow-up, patient denies any pain, patient did have a gout flare 4 days ago, TX: colchicine, Lamisil, allopurinol     77 y.o. male presents for follow-up of fungal nails as well as gout flare. Relates he finished out the Lamisil and doing well. Relates nails have continued to do well. Relates he recently had another flare and took the colchicine and needs a refill. Relates he has started allopurinol with his PCP. Denies any other pedal complaints. Denies n/v/f/c.   Past Medical History:  Diagnosis Date   ADD 02/23/2008   ANXIETY 08/03/2007   ASTHMA 08/03/2007   Asthma    Cancer (HCC)    skin cancer    CHEST PAIN 11/17/2008   Closed fracture of four ribs 06/30/2008   DEPRESSION 08/03/2007   ERECTILE DYSFUNCTION, ORGANIC 02/23/2008   Failed moderate sedation during procedure 11/02/2008   GERD 08/03/2007   HAND PAIN, LEFT 06/20/2009   Heart murmur    HIP PAIN, RIGHT 05/27/2008   HYPERLIPIDEMIA 08/03/2007   HYPERTENSION 08/03/2007   Irritable bowel syndrome 11/02/2008   OSTEOARTHRITIS 11/02/2008   OSTEOPENIA 07/26/2008   Pleurisy    RENAL DISEASE, CHRONIC, MILD 11/04/2008   UNIVERSAL ULCERATIVE COLITIS 01/26/2009    Objective:  Physical Exam: Vascular: DP/PT pulses 2/4 bilateral. CFT <3 seconds. Normal hair growth on digits. No edema.  Skin. No lacerations or abrasions bilateral feet. Scaling and erythema noted in interspaces bilateral and along plantar feet bilateral improved.  Nails 1-5 bilateral are thickened and discolored with subungual debris. Proximal nails are coming in normal and healthy about 90%.  Musculoskeletal: MMT 5/5 bilateral lower extremities in DF, PF, Inversion and Eversion. Deceased ROM in DF of ankle joint.  Neurological: Sensation intact to light  touch.   Assessment:   1. Onychomycosis   2. Tinea pedis of both feet   3. Chronic gout of right foot, unspecified cause         Plan:  Patient was evaluated and treated and all questions answered. -Examined patient -Discussed treatment options for painful dystrophic nails  -Discussed fungal nail treatment options including oral, topical, and laser treatments.  -Discussed tinea improving, may continue with ketoconazole . -Will hold off on lamisil  -Advised on gout prevention and treaments. Discussed diet and lifestyle changes. Continue with colchicine when toe flares and will return as needed for any issues with this. Refill of colchicine provided.  -Advised to start taking the allopurinol while not having a flare.  -Patient to return as needed in future for future possible flares.    Louann Sjogren, DPM

## 2023-05-19 DIAGNOSIS — N1832 Chronic kidney disease, stage 3b: Secondary | ICD-10-CM | POA: Diagnosis not present

## 2023-05-25 ENCOUNTER — Other Ambulatory Visit: Payer: Self-pay | Admitting: Internal Medicine

## 2023-05-27 ENCOUNTER — Other Ambulatory Visit: Payer: Self-pay | Admitting: Internal Medicine

## 2023-05-28 DIAGNOSIS — R809 Proteinuria, unspecified: Secondary | ICD-10-CM | POA: Diagnosis not present

## 2023-05-28 DIAGNOSIS — D631 Anemia in chronic kidney disease: Secondary | ICD-10-CM | POA: Diagnosis not present

## 2023-05-28 DIAGNOSIS — N2581 Secondary hyperparathyroidism of renal origin: Secondary | ICD-10-CM | POA: Diagnosis not present

## 2023-05-28 DIAGNOSIS — N281 Cyst of kidney, acquired: Secondary | ICD-10-CM | POA: Diagnosis not present

## 2023-05-28 DIAGNOSIS — I129 Hypertensive chronic kidney disease with stage 1 through stage 4 chronic kidney disease, or unspecified chronic kidney disease: Secondary | ICD-10-CM | POA: Diagnosis not present

## 2023-05-28 DIAGNOSIS — N1832 Chronic kidney disease, stage 3b: Secondary | ICD-10-CM | POA: Diagnosis not present

## 2023-06-18 ENCOUNTER — Other Ambulatory Visit: Payer: Self-pay

## 2023-06-18 NOTE — Telephone Encounter (Signed)
Received medication refill request- not seen by our practice.

## 2023-07-22 ENCOUNTER — Other Ambulatory Visit: Payer: Self-pay | Admitting: Internal Medicine

## 2023-07-22 DIAGNOSIS — F900 Attention-deficit hyperactivity disorder, predominantly inattentive type: Secondary | ICD-10-CM

## 2023-07-22 NOTE — Telephone Encounter (Signed)
Prescription Request  07/22/2023  LOV: 04/03/2023  What is the name of the medication or equipment? amphetamine-dextroamphetamine (ADDERALL) 5 MG tablet   Have you contacted your pharmacy to request a refill? Yes   Which pharmacy would you like this sent to?  [id number] - [place], [place] - [address] [place] [address] Phone: [phone] Fax: [phone]     Patient notified that their request is being sent to the clinical staff for review and that they should receive a response within 2 business days.   Please advise at Mobile [phone] (mobile)

## 2023-07-24 MED ORDER — AMPHETAMINE-DEXTROAMPHETAMINE 5 MG PO TABS: 5.0000 mg | ORAL_TABLET | Freq: Every day | ORAL | 0 refills | Status: DC

## 2023-07-24 NOTE — Telephone Encounter (Signed)
Refills sent

## 2023-07-26 ENCOUNTER — Other Ambulatory Visit: Payer: Self-pay | Admitting: Internal Medicine

## 2023-07-26 DIAGNOSIS — F411 Generalized anxiety disorder: Secondary | ICD-10-CM

## 2023-08-04 ENCOUNTER — Other Ambulatory Visit: Payer: Self-pay | Admitting: Internal Medicine

## 2023-08-29 DIAGNOSIS — R799 Abnormal finding of blood chemistry, unspecified: Secondary | ICD-10-CM | POA: Diagnosis not present

## 2023-08-29 DIAGNOSIS — N1832 Chronic kidney disease, stage 3b: Secondary | ICD-10-CM | POA: Diagnosis not present

## 2023-08-30 ENCOUNTER — Other Ambulatory Visit: Payer: Self-pay | Admitting: Internal Medicine

## 2023-09-04 DIAGNOSIS — I129 Hypertensive chronic kidney disease with stage 1 through stage 4 chronic kidney disease, or unspecified chronic kidney disease: Secondary | ICD-10-CM | POA: Diagnosis not present

## 2023-09-04 DIAGNOSIS — N1832 Chronic kidney disease, stage 3b: Secondary | ICD-10-CM | POA: Diagnosis not present

## 2023-09-04 DIAGNOSIS — N2581 Secondary hyperparathyroidism of renal origin: Secondary | ICD-10-CM | POA: Diagnosis not present

## 2023-09-04 DIAGNOSIS — R809 Proteinuria, unspecified: Secondary | ICD-10-CM | POA: Diagnosis not present

## 2023-09-04 DIAGNOSIS — M109 Gout, unspecified: Secondary | ICD-10-CM | POA: Diagnosis not present

## 2023-09-04 DIAGNOSIS — D631 Anemia in chronic kidney disease: Secondary | ICD-10-CM | POA: Diagnosis not present

## 2023-09-19 ENCOUNTER — Other Ambulatory Visit: Payer: Self-pay | Admitting: Internal Medicine

## 2023-09-22 ENCOUNTER — Other Ambulatory Visit: Payer: Self-pay | Admitting: Internal Medicine

## 2023-09-22 DIAGNOSIS — M1A9XX0 Chronic gout, unspecified, without tophus (tophi): Secondary | ICD-10-CM

## 2023-09-22 DIAGNOSIS — F411 Generalized anxiety disorder: Secondary | ICD-10-CM

## 2023-10-09 DIAGNOSIS — R3912 Poor urinary stream: Secondary | ICD-10-CM | POA: Diagnosis not present

## 2023-10-11 ENCOUNTER — Other Ambulatory Visit: Payer: Self-pay | Admitting: Internal Medicine

## 2023-10-11 DIAGNOSIS — I10 Essential (primary) hypertension: Secondary | ICD-10-CM

## 2023-10-17 ENCOUNTER — Other Ambulatory Visit: Payer: Self-pay | Admitting: Internal Medicine

## 2023-10-27 ENCOUNTER — Other Ambulatory Visit: Payer: Self-pay | Admitting: Internal Medicine

## 2023-10-27 DIAGNOSIS — F900 Attention-deficit hyperactivity disorder, predominantly inattentive type: Secondary | ICD-10-CM

## 2023-10-27 NOTE — Telephone Encounter (Signed)
Prescription Request  10/27/2023  LOV: 04/03/2023  What is the name of the medication or equipment? amphetamine-dextroamphetamine (ADDERALL) 5 MG tablet   Have you contacted your pharmacy to request a refill? Yes   Which pharmacy would you like this sent to?  [id number] - [place], [place] - [address] [place] [address] Phone: [phone] Fax: [phone]     Patient notified that their request is being sent to the clinical staff for review and that they should receive a response within 2 business days.   Please advise at Mobile [phone] (mobile)

## 2023-10-28 MED ORDER — AMPHETAMINE-DEXTROAMPHETAMINE 5 MG PO TABS
5.0000 mg | ORAL_TABLET | Freq: Every day | ORAL | 0 refills | Status: DC
Start: 2023-10-28 — End: 2023-11-24

## 2023-10-28 NOTE — Telephone Encounter (Signed)
Refill sent.

## 2023-10-28 NOTE — Addendum Note (Signed)
Addended by: Kern Reap B on: 10/28/2023 01:21 PM   Modules accepted: Orders

## 2023-10-28 NOTE — Telephone Encounter (Signed)
Last office visit was 04/03/23.  Okay to refill?

## 2023-11-10 ENCOUNTER — Encounter: Payer: Self-pay | Admitting: Family Medicine

## 2023-11-10 ENCOUNTER — Ambulatory Visit (INDEPENDENT_AMBULATORY_CARE_PROVIDER_SITE_OTHER): Payer: Medicare Other | Admitting: Family Medicine

## 2023-11-10 ENCOUNTER — Other Ambulatory Visit: Payer: Self-pay | Admitting: Internal Medicine

## 2023-11-10 VITALS — BP 100/60 | HR 97 | Temp 98.3°F | Ht 65.0 in | Wt 141.3 lb

## 2023-11-10 DIAGNOSIS — J069 Acute upper respiratory infection, unspecified: Secondary | ICD-10-CM

## 2023-11-10 DIAGNOSIS — J4520 Mild intermittent asthma, uncomplicated: Secondary | ICD-10-CM

## 2023-11-10 DIAGNOSIS — R509 Fever, unspecified: Secondary | ICD-10-CM

## 2023-11-10 DIAGNOSIS — M1A9XX0 Chronic gout, unspecified, without tophus (tophi): Secondary | ICD-10-CM

## 2023-11-10 DIAGNOSIS — R350 Frequency of micturition: Secondary | ICD-10-CM | POA: Diagnosis not present

## 2023-11-10 LAB — POCT INFLUENZA A/B
Influenza A, POC: NEGATIVE
Influenza B, POC: NEGATIVE

## 2023-11-10 LAB — POC COVID19 BINAXNOW: SARS Coronavirus 2 Ag: NEGATIVE

## 2023-11-10 NOTE — Progress Notes (Signed)
Established Patient Office Visit  Subjective   Patient ID: Joseph Mejia, male    DOB: 1946/02/07  Age: 77 y.o. MRN: [id number]  Chief Complaint  Patient presents with   Fever   Headache    Patient complains of headaches. X3 days, Tried Tylenol   Urinary Frequency    Patient complains of urinary frequency, x3 days,     HPI   Joseph Mejia is seen today as a work in with approximately 3-day history of malaise, headache, body aches, chills, nasal congestion and very minimal cough.  Also relates about 3 days of urine frequency but no burning with urination.  Denies any nausea, vomiting, or diarrhea.  He sees nephrologist and does relate going on [place] about 30 days ago.  No abdominal pain.  No flank pain.  No known sick exposures but he was around a large group from church most of last week.  Has taken occasional Tylenol.  Feels slightly better today.  Has also had recent left third toe pain.  History of gout and he presumes this is acute gout.  Had been taking some colchicine intermittently.  Takes allopurinol at baseline.  Past Medical History:  Diagnosis Date   ADD 02/23/2008   ANXIETY 08/03/2007   ASTHMA 08/03/2007   Asthma    Cancer (HCC)    skin cancer    CHEST PAIN 11/17/2008   Closed fracture of four ribs 06/30/2008   DEPRESSION 08/03/2007   ERECTILE DYSFUNCTION, ORGANIC 02/23/2008   Failed moderate sedation during procedure 11/02/2008   GERD 08/03/2007   HAND PAIN, LEFT 06/20/2009   Heart murmur    HIP PAIN, RIGHT 05/27/2008   HYPERLIPIDEMIA 08/03/2007   HYPERTENSION 08/03/2007   Irritable bowel syndrome 11/02/2008   OSTEOARTHRITIS 11/02/2008   OSTEOPENIA 07/26/2008   Pleurisy    RENAL DISEASE, CHRONIC, MILD 11/04/2008   UNIVERSAL ULCERATIVE COLITIS 01/26/2009   Past Surgical History:  Procedure Laterality Date   ANTERIOR FUSION CERVICAL SPINE  12/30/2010   BASAL CELL CARCINOMA EXCISION Left    ear, head   CARPAL TUNNEL RELEASE  12/30/2010    CERVICAL FUSION  04/30/2015   C7   COLONOSCOPY W/ BIOPSIES  06/2001, 08/2006, 07/2008   colon polyp, ulcerative colitis, lymphoid aggregates   ELBOW SURGERY  01/30/2002   left   HAND SURGERY  02/27/2010   basal thumb joint removal and tendon transplant([facility])   INCISION AND DRAINAGE PERIRECTAL ABSCESS  12/30/1996   LUMBAR [facility] SURGERY  12/30/1990   LUMBAR LAMINECTOMY/DECOMPRESSION MICRODISCECTOMY N/A 07/18/2022   Procedure: Central decompression microdiscectomy Lumbsr two-three;  Surgeon: Jene Every, MD;  [facility];  Service: Orthopedics;  Laterality: N/A;   SHOULDER SURGERY Bilateral 09/30/2007   injury at work, dislocated   TONSILLECTOMY     removed as a child   UPPER GASTROINTESTINAL ENDOSCOPY  06/29/2001   2cm hiatal hernia    reports that he has never smoked. He has never used smokeless tobacco. He reports that he does not currently use alcohol. He reports that he does not use drugs. family history includes COPD in his sister; Cancer in his father; Colon cancer (age of onset: 80) in his father; Heart disease in his brother and mother; Seizures (age of onset: 86) in his brother. Allergies  Allergen Reactions   Novocain [Procaine] Palpitations    Lightheadedness    Promethazine Other (See Comments)    Family members had hallucinations when taking this, pt prefers to avoid this drug   Amlodipine Swelling  Feet swelling   Aspirin     nose bleeds    Mercaptopurine     REACTION: myalgias   Tape     Tears skins, tolerates cloth and paper tape     Review of Systems  Constitutional:  Positive for chills, fever and malaise/fatigue.  HENT:  Positive for congestion. Negative for sore throat.   Respiratory:  Negative for shortness of breath.   Cardiovascular:  Negative for chest pain.  Gastrointestinal:  Negative for diarrhea, nausea and vomiting.  Genitourinary:  Positive for frequency. Negative for dysuria, flank pain and hematuria.      Objective:     BP 100/60  (BP Location: Left Arm, Patient Position: Sitting, Cuff Size: Normal)   Pulse 97   Temp 98.3 F (36.8 C) (Oral)   Ht 5\' 5"  (1.651 m)   Wt 141 lb 4.8 oz (64.1 kg)   SpO2 98%   BMI 23.51 kg/m  BP Readings from Last 3 Encounters:  11/10/23 100/60  04/03/23 130/80  11/18/22 (!) 145/84   Wt Readings from Last 3 Encounters:  11/10/23 141 lb 4.8 oz (64.1 kg)  04/03/23 147 lb (66.7 kg)  11/18/22 149 lb 3.2 oz (67.7 kg)      Physical Exam Vitals reviewed.  Constitutional:      General: He is not in acute distress.    Appearance: He is well-developed. He is not ill-appearing.  HENT:     Mouth/Throat:     Mouth: Mucous membranes are moist.     Pharynx: Oropharynx is clear.  Cardiovascular:     Rate and Rhythm: Normal rate and regular rhythm.  Pulmonary:     Effort: Pulmonary effort is normal.     Breath sounds: Normal breath sounds. No wheezing or rales.  Musculoskeletal:     Cervical back: Neck supple.     Comments: Left third toe reveals no erythema or visible swelling.  No warmth.  Lymphadenopathy:     Cervical: No cervical adenopathy.  Neurological:     Mental Status: He is alert.      Results for orders placed or performed in visit on 11/10/23  POC COVID-19  Result Value Ref Range   SARS Coronavirus 2 Ag Negative Negative  POC Influenza A/B  Result Value Ref Range   Influenza A, POC Negative Negative   Influenza B, POC Negative Negative      The 10-year ASCVD risk score (Arnett DK, et al., 2019) is: 21.3%    Assessment & Plan:   #1 acute febrile illness.  He has associated the respiratory symptoms including nasal congestion headache and minimal cough.  COVID and influenza testing negative.  Suspect viral.  Patient nontoxic in appearance.  -Continue plenty of fluids and rest -May increase Tylenol to 500 mg to every 6 hours as needed for fever and body aches -Follow-up for any persistent or worsening symptoms  #2 urine frequency.  Patient did just recently  go on [place].  Denies burning with urination.  Doubt this is related to his fever but will check urine dipstick to be safe  #3 history of gout.  Recent acute pain left third toe without recent injury.  Continue allopurinol and colchicine for acute symptoms   No follow-ups on file.    Evelena Peat, MD

## 2023-11-14 DIAGNOSIS — N1832 Chronic kidney disease, stage 3b: Secondary | ICD-10-CM | POA: Diagnosis not present

## 2023-11-24 ENCOUNTER — Other Ambulatory Visit: Payer: Self-pay | Admitting: Internal Medicine

## 2023-11-24 DIAGNOSIS — F900 Attention-deficit hyperactivity disorder, predominantly inattentive type: Secondary | ICD-10-CM

## 2023-11-24 MED ORDER — AMPHETAMINE-DEXTROAMPHETAMINE 5 MG PO TABS: 5.0000 mg | ORAL_TABLET | Freq: Every day | ORAL | 0 refills | Status: DC

## 2023-11-24 NOTE — Telephone Encounter (Signed)
Patient has scheduled a VV 12/02/23.

## 2023-11-24 NOTE — Telephone Encounter (Signed)
Prescription Request  11/24/2023  LOV: 04/03/2023  What is the name of the medication or equipment? amphetamine-dextroamphetamine (ADDERALL) 5 MG tablet   Have you contacted your pharmacy to request a refill? No   Which pharmacy would you like this sent to?  [facility] [id number] - [place],  - [address]  [place] [address] Phone: [phone] Fax: [phone]     Patient notified that their request is being sent to the clinical staff for review and that they should receive a response within 2 business days.   Please advise at Mobile [phone] (mobile)

## 2023-11-24 NOTE — Telephone Encounter (Signed)
Last office visit with Dr Ardyth Harps 04/03/23. Okay to refill?

## 2023-11-26 ENCOUNTER — Telehealth: Payer: Self-pay

## 2023-11-26 NOTE — Telephone Encounter (Signed)
Received fax for refill on alprazolam. Pt is due for CPE. However, last ov 11/10/2023

## 2023-12-01 ENCOUNTER — Other Ambulatory Visit: Payer: Self-pay | Admitting: Internal Medicine

## 2023-12-01 DIAGNOSIS — F411 Generalized anxiety disorder: Secondary | ICD-10-CM

## 2023-12-01 DIAGNOSIS — N1832 Chronic kidney disease, stage 3b: Secondary | ICD-10-CM | POA: Diagnosis not present

## 2023-12-01 MED ORDER — ALPRAZOLAM 0.25 MG PO TABS: ORAL_TABLET | ORAL | 1 refills | Status: DC

## 2023-12-02 ENCOUNTER — Encounter: Payer: Self-pay | Admitting: Internal Medicine

## 2023-12-02 ENCOUNTER — Telehealth (INDEPENDENT_AMBULATORY_CARE_PROVIDER_SITE_OTHER): Payer: Medicare Other | Admitting: Internal Medicine

## 2023-12-02 DIAGNOSIS — F411 Generalized anxiety disorder: Secondary | ICD-10-CM | POA: Diagnosis not present

## 2023-12-02 DIAGNOSIS — F900 Attention-deficit hyperactivity disorder, predominantly inattentive type: Secondary | ICD-10-CM

## 2023-12-02 MED ORDER — ALPRAZOLAM 0.25 MG PO TABS: ORAL_TABLET | ORAL | 1 refills | Status: DC

## 2023-12-02 MED ORDER — AMPHETAMINE-DEXTROAMPHETAMINE 5 MG PO TABS: 5.0000 mg | ORAL_TABLET | Freq: Every day | ORAL | 0 refills | Status: DC

## 2023-12-02 NOTE — Telephone Encounter (Signed)
Noted  

## 2023-12-02 NOTE — Progress Notes (Signed)
Virtual Visit via Video Note  I connected with Joseph Mejia on 12/02/23 at  2:30 PM EST by a video enabled telemedicine application and verified that I am speaking with the correct person using two identifiers.  Location patient: home Location provider: work office Persons participating in the virtual visit: patient, provider  I discussed the limitations of evaluation and management by telemedicine and the availability of in person appointments. The patient expressed understanding and agreed to proceed.   HPI: Scheduled visit for the purpose of medication refills.  He is due for his Adderall that he takes for ADHD as well as alprazolam that he takes for anxiety.  He tells me he was started on Farxiga for his chronic kidney disease and has been having increased urination.   ROS: Negative unless indicated in HPI.  Past Medical History:  Diagnosis Date   ADD 02/23/2008   ANXIETY 08/03/2007   ASTHMA 08/03/2007   Asthma    Cancer (HCC)    skin cancer    CHEST PAIN 11/17/2008   Closed fracture of four ribs 06/30/2008   DEPRESSION 08/03/2007   ERECTILE DYSFUNCTION, ORGANIC 02/23/2008   Failed moderate sedation during procedure 11/02/2008   GERD 08/03/2007   HAND PAIN, LEFT 06/20/2009   Heart murmur    HIP PAIN, RIGHT 05/27/2008   HYPERLIPIDEMIA 08/03/2007   HYPERTENSION 08/03/2007   Irritable bowel syndrome 11/02/2008   OSTEOARTHRITIS 11/02/2008   OSTEOPENIA 07/26/2008   Pleurisy    RENAL DISEASE, CHRONIC, MILD 11/04/2008   UNIVERSAL ULCERATIVE COLITIS 01/26/2009    Past Surgical History:  Procedure Laterality Date   ANTERIOR FUSION CERVICAL SPINE  12/30/2010   BASAL CELL CARCINOMA EXCISION Left    ear, head   CARPAL TUNNEL RELEASE  12/30/2010   CERVICAL FUSION  04/30/2015   C7   COLONOSCOPY W/ BIOPSIES  06/2001, 08/2006, 07/2008   colon polyp, ulcerative colitis, lymphoid aggregates   ELBOW SURGERY  01/30/2002   left   HAND SURGERY  02/27/2010   basal  thumb joint removal and tendon transplant([facility])   INCISION AND DRAINAGE PERIRECTAL ABSCESS  12/30/1996   LUMBAR DISC SURGERY  12/30/1990   LUMBAR LAMINECTOMY/DECOMPRESSION MICRODISCECTOMY N/A 07/18/2022   Procedure: Central decompression microdiscectomy Lumbsr two-three;  Surgeon: Jene Every, MD;  Location: [facility] OR;  Service: Orthopedics;  Laterality: N/A;   SHOULDER SURGERY Bilateral 09/30/2007   injury at work, dislocated   TONSILLECTOMY     removed as a child   UPPER GASTROINTESTINAL ENDOSCOPY  06/29/2001   2cm hiatal hernia    Family History  Problem Relation Age of Onset   Heart disease Mother        s/p CABG age 58   Cancer Father        colon , brain   Colon cancer Father 2   COPD Sister    Heart disease Brother        CAD   Seizures Brother 40   Colon polyps Neg Hx    Esophageal cancer Neg Hx    Rectal cancer Neg Hx    Stomach cancer Neg Hx     SOCIAL HX:   reports that he has never smoked. He has never used smokeless tobacco. He reports that he does not currently use alcohol. He reports that he does not use drugs.   Current Outpatient Medications:    acetaminophen (TYLENOL) 650 MG CR tablet, Take 1,300 mg by mouth at bedtime as needed for pain., Disp: , Rfl:  allopurinol (ZYLOPRIM) 300 MG tablet, TAKE 1 TABLET BY MOUTH DAILY, Disp: 90 tablet, Rfl: 1   amLODipine (NORVASC) 5 MG tablet, TAKE 1 TABLET BY MOUTH DAILY, Disp: 90 tablet, Rfl: 0   amphetamine-dextroamphetamine (ADDERALL) 5 MG tablet, Take 1 tablet (5 mg total) by mouth daily., Disp: 30 tablet, Rfl: 0   Ascorbic Acid (VITAMIN C) 100 MG tablet, Take 500 mg by mouth daily., Disp: , Rfl:    atorvastatin (LIPITOR) 40 MG tablet, TAKE 1 TABLET BY MOUTH DAILY, Disp: 100 tablet, Rfl: 0   buPROPion (WELLBUTRIN XL) 300 MG 24 hr tablet, TAKE 1 TABLET BY MOUTH DAILY, Disp: 100 tablet, Rfl: 0   colchicine 0.6 MG tablet, Take 1-2 tablets daily  as needed for gout flare, Disp: 30 tablet, Rfl: 2   Cyanocobalamin  (B-12) 3000 MCG CAPS, Take 3,000 mcg by mouth daily., Disp: , Rfl:    dicyclomine (BENTYL) 20 MG tablet, TAKE 1 TABLET BY MOUTH EVERY 6  HOURS (Patient taking differently: Take 20 mg by mouth daily.), Disp: 360 tablet, Rfl: 1   FARXIGA 10 MG TABS tablet, Take 10 mg by mouth daily., Disp: , Rfl:    fexofenadine (ALLEGRA) 180 MG tablet, Take 180 mg by mouth daily as needed for allergies., Disp: , Rfl:    fluticasone (FLONASE) 50 MCG/ACT nasal spray, Place 1 spray into both nostrils daily as needed for allergies or rhinitis., Disp: , Rfl:    Fluticasone Furoate (ARNUITY ELLIPTA) 50 MCG/ACT AEPB, USE 1 INHALATION BY MOUTH DAILY, Disp: 100 each, Rfl: 0   ketoconazole (NIZORAL) 2 % cream, Apply 1 Application topically daily., Disp: 60 g, Rfl: 2   Multiple Vitamin (MULTIVITAMIN WITH MINERALS) TABS tablet, Take 1 tablet by mouth daily., Disp: , Rfl:    NON FORMULARY, Cherry juice, Disp: , Rfl:    Olopatadine HCl (PATADAY OP), Place 1 drop into both eyes daily as needed (allergies / irritation)., Disp: , Rfl:    omeprazole (PRILOSEC) 20 MG capsule, TAKE 1 CAPSULE BY MOUTH DAILY, Disp: 100 capsule, Rfl: 1   polyethylene glycol (MIRALAX / GLYCOLAX) 17 g packet, Take 17 g by mouth daily., Disp: 14 each, Rfl: 0   PROAIR HFA 108 (90 Base) MCG/ACT inhaler, INHALE 2 PUFFS INTO THE LUNGS 4 TIMES DAILY (Patient taking differently: Inhale 2 puffs into the lungs every 6 (six) hours as needed for wheezing.), Disp: 25.5 each, Rfl: 2   Simethicone (GAS-X PO), Take 2 tablets by mouth daily as needed (gas)., Disp: , Rfl:    terbinafine (LAMISIL) 250 MG tablet, Take 1 tablet (250 mg total) by mouth daily., Disp: 30 tablet, Rfl: 0   ALPRAZolam (XANAX) 0.25 MG tablet, TAKE 1 TABLET BY MOUTH EVERY NIGHT AT BEDTIME AS NEEDED FOR ANXIETY AND TAKE 1/2 TABLET DAILY AS NEEDED, Disp: 30 tablet, Rfl: 1   amphetamine-dextroamphetamine (ADDERALL) 5 MG tablet, Take 1 tablet (5 mg total) by mouth daily., Disp: 30 tablet, Rfl: 0    amphetamine-dextroamphetamine (ADDERALL) 5 MG tablet, Take 1 tablet (5 mg total) by mouth daily., Disp: 30 tablet, Rfl: 0  EXAM:   VITALS per patient if applicable: None reported  GENERAL: alert, oriented, appears well and in no acute distress  HEENT: atraumatic, conjunttiva clear, no obvious abnormalities on inspection of external nose and ears  NECK: normal movements of the head and neck  LUNGS: on inspection no signs of respiratory distress, breathing rate appears normal, no obvious gross increased work of breathing, gasping or wheezing  CV: no obvious cyanosis  MS: moves all visible extremities without noticeable abnormality  PSYCH/NEURO: pleasant and cooperative, no obvious depression or anxiety, speech and thought processing grossly intact  ASSESSMENT AND PLAN:   Anxiety state - Plan: ALPRAZolam (XANAX) 0.25 MG tablet  Attention deficit hyperactivity disorder (ADHD), predominantly inattentive type - Plan: amphetamine-dextroamphetamine (ADDERALL) 5 MG tablet, amphetamine-dextroamphetamine (ADDERALL) 5 MG tablet  -PDMP reviewed, no red flags, overdose risk score is 90. -Refill alprazolam, also refill Adderall 5 mg to take 1 tablet daily for total of 30 tablets a month x 3 months.   I discussed the assessment and treatment plan with the patient. The patient was provided an opportunity to ask questions and all were answered. The patient agreed with the plan and demonstrated an understanding of the instructions.   The patient was advised to call back or seek an in-person evaluation if the symptoms worsen or if the condition fails to improve as anticipated.    Chaya Jan, MD   Primary Care at [facility]

## 2023-12-02 NOTE — Progress Notes (Signed)
 Per patient no change in vitals since last visit, unable to obtain new vitals due to telehealth visit

## 2023-12-09 DIAGNOSIS — I129 Hypertensive chronic kidney disease with stage 1 through stage 4 chronic kidney disease, or unspecified chronic kidney disease: Secondary | ICD-10-CM | POA: Diagnosis not present

## 2023-12-09 DIAGNOSIS — R809 Proteinuria, unspecified: Secondary | ICD-10-CM | POA: Diagnosis not present

## 2023-12-09 DIAGNOSIS — N2581 Secondary hyperparathyroidism of renal origin: Secondary | ICD-10-CM | POA: Diagnosis not present

## 2023-12-09 DIAGNOSIS — R42 Dizziness and giddiness: Secondary | ICD-10-CM | POA: Diagnosis not present

## 2023-12-09 DIAGNOSIS — D631 Anemia in chronic kidney disease: Secondary | ICD-10-CM | POA: Diagnosis not present

## 2023-12-09 DIAGNOSIS — N1832 Chronic kidney disease, stage 3b: Secondary | ICD-10-CM | POA: Diagnosis not present

## 2023-12-09 DIAGNOSIS — E875 Hyperkalemia: Secondary | ICD-10-CM | POA: Diagnosis not present

## 2023-12-10 DIAGNOSIS — L821 Other seborrheic keratosis: Secondary | ICD-10-CM | POA: Diagnosis not present

## 2023-12-10 DIAGNOSIS — C4441 Basal cell carcinoma of skin of scalp and neck: Secondary | ICD-10-CM | POA: Diagnosis not present

## 2023-12-10 DIAGNOSIS — L570 Actinic keratosis: Secondary | ICD-10-CM | POA: Diagnosis not present

## 2023-12-12 ENCOUNTER — Telehealth: Payer: Self-pay | Admitting: Internal Medicine

## 2023-12-12 NOTE — Telephone Encounter (Signed)
Pt wife is calling and said md told pt to take 2 pills  instead of 1 pill and he need new rx colchicine 0.6 MG tablet  90 day supply [facility] [id number] - [place], [place] - [address] Phone: [phone]  Fax: [phone]

## 2023-12-15 MED ORDER — COLCHICINE 0.6 MG PO TABS: 0.6000 mg | ORAL_TABLET | Freq: Two times a day (BID) | ORAL | 0 refills | Status: DC

## 2023-12-15 NOTE — Telephone Encounter (Signed)
Refill sent.

## 2023-12-15 NOTE — Telephone Encounter (Signed)
Last filled by Louann Sjogren, DPM .  Okay to fill with new directions?

## 2023-12-19 ENCOUNTER — Other Ambulatory Visit: Payer: Self-pay | Admitting: Internal Medicine

## 2023-12-20 ENCOUNTER — Other Ambulatory Visit: Payer: Self-pay | Admitting: Internal Medicine

## 2023-12-20 DIAGNOSIS — M1A9XX0 Chronic gout, unspecified, without tophus (tophi): Secondary | ICD-10-CM

## 2024-01-07 DIAGNOSIS — C4441 Basal cell carcinoma of skin of scalp and neck: Secondary | ICD-10-CM | POA: Diagnosis not present

## 2024-01-09 ENCOUNTER — Other Ambulatory Visit: Payer: Self-pay | Admitting: Internal Medicine

## 2024-01-09 DIAGNOSIS — I10 Essential (primary) hypertension: Secondary | ICD-10-CM

## 2024-01-22 ENCOUNTER — Encounter: Payer: Self-pay | Admitting: Internal Medicine

## 2024-02-03 DIAGNOSIS — N1832 Chronic kidney disease, stage 3b: Secondary | ICD-10-CM | POA: Diagnosis not present

## 2024-02-06 ENCOUNTER — Other Ambulatory Visit: Payer: Self-pay | Admitting: Internal Medicine

## 2024-02-12 DIAGNOSIS — N2581 Secondary hyperparathyroidism of renal origin: Secondary | ICD-10-CM | POA: Diagnosis not present

## 2024-02-12 DIAGNOSIS — E875 Hyperkalemia: Secondary | ICD-10-CM | POA: Diagnosis not present

## 2024-02-12 DIAGNOSIS — R809 Proteinuria, unspecified: Secondary | ICD-10-CM | POA: Diagnosis not present

## 2024-02-12 DIAGNOSIS — I129 Hypertensive chronic kidney disease with stage 1 through stage 4 chronic kidney disease, or unspecified chronic kidney disease: Secondary | ICD-10-CM | POA: Diagnosis not present

## 2024-02-12 DIAGNOSIS — N1832 Chronic kidney disease, stage 3b: Secondary | ICD-10-CM | POA: Diagnosis not present

## 2024-02-12 DIAGNOSIS — D631 Anemia in chronic kidney disease: Secondary | ICD-10-CM | POA: Diagnosis not present

## 2024-02-13 ENCOUNTER — Other Ambulatory Visit: Payer: Self-pay | Admitting: Internal Medicine

## 2024-02-13 DIAGNOSIS — J4520 Mild intermittent asthma, uncomplicated: Secondary | ICD-10-CM

## 2024-02-20 ENCOUNTER — Other Ambulatory Visit: Payer: Self-pay | Admitting: Internal Medicine

## 2024-02-24 ENCOUNTER — Other Ambulatory Visit: Payer: Self-pay | Admitting: Internal Medicine

## 2024-02-24 DIAGNOSIS — M1A9XX0 Chronic gout, unspecified, without tophus (tophi): Secondary | ICD-10-CM

## 2024-02-24 MED ORDER — ALLOPURINOL 300 MG PO TABS: 300.0000 mg | ORAL_TABLET | Freq: Every day | ORAL | 0 refills | Status: DC

## 2024-02-24 NOTE — Telephone Encounter (Signed)
 Last Fill: 12/22/23 90 tabs/0 refills  Last OV: 12/02/23 Next OV: 04/01/24 AWV  Routing to provider for review/authorization.

## 2024-02-24 NOTE — Telephone Encounter (Signed)
 Copied from CRM [id number]. Topic: Clinical - Medication Refill >> Feb 24, 2024  9:27 AM [name] wrote: Most Recent Primary Care Visit:  Provider: Henderson Cloud  Department: [facility]  Visit Type: MYCHART VIDEO VISIT  Date: 12/02/2023  Medication: allopurinol (ZYLOPRIM) 300 MG tablet  Has the patient contacted their pharmacy? Yes (Agent: If no, request that the patient contact the pharmacy for the refill. If patient does not wish to contact the pharmacy document the reason why and proceed with request.) (Agent: If yes, when and what did the pharmacy advise?) - Say they need prescription from provider  Is this the correct pharmacy for this prescription? Yes If no, delete pharmacy and type the correct one.  This is the patient's preferred pharmacy:  [facility] - [place], [place] - [address] [place] [address] Phone: [phone] Fax: [phone]   Has the prescription been filled recently? No  Is the patient out of the medication? Yes  Has the patient been seen for an appointment in the last year OR does the patient have an upcoming appointment? Yes  Can we respond through MyChart? Yes  Agent: Please be advised that Rx refills may take up to 3 business days. We ask that you follow-up with your pharmacy.

## 2024-02-25 ENCOUNTER — Other Ambulatory Visit: Payer: Self-pay | Admitting: Internal Medicine

## 2024-02-25 MED ORDER — ALBUTEROL SULFATE HFA 108 (90 BASE) MCG/ACT IN AERS: 2.0000 | INHALATION_SPRAY | Freq: Four times a day (QID) | RESPIRATORY_TRACT | 0 refills | Status: AC | PRN

## 2024-02-25 NOTE — Telephone Encounter (Signed)
 Copied from CRM [id number]. Topic: Clinical - Medication Refill >> Feb 25, 2024 10:06 AM Myrtice Lauth wrote: Most Recent Primary Care Visit:  Provider: Henderson Cloud  Department: [facility]  Visit Type: MYCHART VIDEO VISIT  Date: 12/02/2023  Medication: PROAIR HFA 108 (90 Base) MCG/ACT inhaler  Has the patient contacted their pharmacy? Yes (Agent: If no, request that the patient contact the pharmacy for the refill. If patient does not wish to contact the pharmacy document the reason why and proceed with request.) (Agent: If yes, when and what did the pharmacy advise?)  Is this the correct pharmacy for this prescription? Yes If no, delete pharmacy and type the correct one.  This is the patient's preferred pharmacy:    [facility] - [place], [place] - [address] [place] [address] Phone: [phone] Fax: [phone]   Has the prescription been filled recently? Yes  Is the patient out of the medication? Yes  Has the patient been seen for an appointment in the last year OR does the patient have an upcoming appointment? Yes  Can we respond through MyChart? Yes  Agent: Please be advised that Rx refills may take up to 3 business days. We ask that you follow-up with your pharmacy.

## 2024-03-01 ENCOUNTER — Other Ambulatory Visit: Payer: Self-pay | Admitting: Internal Medicine

## 2024-03-01 DIAGNOSIS — F900 Attention-deficit hyperactivity disorder, predominantly inattentive type: Secondary | ICD-10-CM

## 2024-03-01 NOTE — Telephone Encounter (Unsigned)
 Copied from CRM [id number]. Topic: Clinical - Medication Refill >> Mar 01, 2024  9:16 AM Marica Otter wrote: Most Recent Primary Care Visit:  Provider: Henderson Cloud  Department: [facility]  Visit Type: MYCHART VIDEO VISIT  Date: 12/02/2023  Medication: amphetamine-dextroamphetamine (ADDERALL) 5 MG tablet  Has the patient contacted their pharmacy? Yes, contact provider (Agent: If no, request that the patient contact the pharmacy for the refill. If patient does not wish to contact the pharmacy document the reason why and proceed with request.) (Agent: If yes, when and what did the pharmacy advise?)  Is this the correct pharmacy for this prescription? Yes If no, delete pharmacy and type the correct one.  This is the patient's preferred pharmacy:  [facility] [id number] - [place], [place] - [address] [place] [address] Phone: [phone] Fax: [phone]   Has the prescription been filled recently? No  Is the patient out of the medication? Yes  Has the patient been seen for an appointment in the last year OR does the patient have an upcoming appointment? Yes  Can we respond through MyChart? No  Agent: Please be advised that Rx refills may take up to 3 business days. We ask that you follow-up with your pharmacy.

## 2024-03-01 NOTE — Telephone Encounter (Signed)
 Last refill was 12/02/23 Last office visit with Dr Ardyth Harps 12/02/23 Last CPE 04/24/22  Okay to fill?

## 2024-03-02 MED ORDER — AMPHETAMINE-DEXTROAMPHETAMINE 5 MG PO TABS: 5.0000 mg | ORAL_TABLET | Freq: Every day | ORAL | 0 refills | Status: DC

## 2024-03-22 ENCOUNTER — Other Ambulatory Visit: Payer: Self-pay | Admitting: Internal Medicine

## 2024-03-26 ENCOUNTER — Other Ambulatory Visit: Payer: Self-pay | Admitting: Internal Medicine

## 2024-04-01 ENCOUNTER — Other Ambulatory Visit: Payer: Self-pay | Admitting: Internal Medicine

## 2024-04-01 ENCOUNTER — Ambulatory Visit: Payer: Medicare Other | Admitting: Family Medicine

## 2024-04-01 ENCOUNTER — Telehealth: Payer: Self-pay | Admitting: Internal Medicine

## 2024-04-01 VITALS — BP 142/72 | Wt 152.0 lb

## 2024-04-01 DIAGNOSIS — I10 Essential (primary) hypertension: Secondary | ICD-10-CM

## 2024-04-01 DIAGNOSIS — Z0000 Encounter for general adult medical examination without abnormal findings: Secondary | ICD-10-CM

## 2024-04-01 DIAGNOSIS — F900 Attention-deficit hyperactivity disorder, predominantly inattentive type: Secondary | ICD-10-CM

## 2024-04-01 NOTE — Progress Notes (Signed)
 PATIENT CHECK-IN and HEALTH RISK ASSESSMENT QUESTIONNAIRE:  -completed by phone/video for upcoming Medicare Preventive Visit  Pre-Visit Check-in: 1)Vitals (height, wt, BP, etc) - record in vitals section for visit on day of visit Request home vitals (wt, BP, etc.) and enter into vitals, THEN update Vital Signs SmartPhrase below at the top of the HPI. See below.  2)Review and Update Medications, Allergies PMH, Surgeries, Social history in Epic 3)Hospitalizations in the last year with date/reason?  n  4)Review and Update Care Team (patient's specialists) in Epic 5) Complete PHQ9 in Epic  6) Complete Fall Screening in Epic 7)Review all Health Maintenance Due and order under PCP if not done.  Medicare Wellness Patient Questionnaire:  Answer theses question about your habits: How often do you have a drink containing alcohol?no Have you ever smoked?n How many packs a day do/did you smoke? n Do you use smokeless tobacco?n Do you use an illicit drugs?n On average, how many days per week do you engage in moderate to strenuous exercise (like a brisk walk)? 2x per weeks - one hour Typical breakfast: skips breakfast Typical lunch: pack of nebs and a drink Typical dinner: veggies and protein Typical snacks: ice cream, oreas  Beverages: water  Answer theses question about your everyday activities: Can you perform most household chores? Are you deaf or have significant trouble hearing?n Do you feel that you have a problem with memory?n Do you feel safe at home?y Last dentist visit? Goes twice a year 8. Do you have any difficulty performing your everyday activities?n Are you having any difficulty walking, taking medications on your own, and or difficulty managing daily home needs?n Do you have difficulty walking or climbing stairs?n Do you have difficulty dressing or bathing?n Do you have difficulty doing errands alone such as visiting a doctor's office or shopping?n Do you currently have any  difficulty preparing food and eating?n Do you currently have any difficulty using the toilet?n Do you have any difficulty managing your finances?n Do you have any difficulties with housekeeping of managing your housekeeping?n   Do you have Advanced Directives in place (Living Will, Healthcare Power or Attorney)? y   Last eye Exam and location? Dr. Tiburcio Pea, goes in June   Do you currently use prescribed or non-prescribed narcotic or opioid pain medications? n  Do you have a history or close family history of breast, ovarian, tubal or peritoneal cancer or a family member with BRCA (breast cancer susceptibility 1 and 2) gene mutations?n   ----------------------------------------------------------------------------------------------------------------------------------------------------------------------------------------------------------------------  Because this visit was a virtual/telehealth visit, some criteria may be missing or patient reported. Any vitals not documented were not able to be obtained and vitals that have been documented are patient reported.    MEDICARE ANNUAL PREVENTIVE CARE VISIT WITH PROVIDER (Welcome to Medicare, initial annual wellness or annual wellness exam)  Virtual Visit via Video Note  I connected with Joseph Mejia on 04/01/24  by a video enabled telemedicine application and verified that I am speaking with the correct person using two identifiers.  Location patient: home Location provider:work or home office Persons participating in the virtual visit: patient, provider  Concerns and/or follow up today: doing ok, has seasonal allergies and has had some itchy and watery eyes, runny nose, pnd.    See HM section in Epic for other details of completed HM.    ROS: negative for report of fevers, unintentional weight loss, vision changes, vision loss, hearing loss or change, chest pain, sob, hemoptysis, melena, hematochezia, hematuria, falls,  bleeding or bruising, thoughts of suicide or self harm, memory loss  Patient-completed extensive health risk assessment - reviewed and discussed with the patient: See Health Risk Assessment completed with patient prior to the visit either above or in recent phone note. This was reviewed in detailed with the patient today and appropriate recommendations, orders and referrals were placed as needed per Summary below and patient instructions.   Review of Medical History: -PMH, PSH, Family History and current specialty and care providers reviewed and updated and listed below   Patient Care Team: Philip Aspen, Limmie Patricia, MD as PCP - General (Internal Medicine) Iva Boop, MD as PCP - Gastroenterology (Gastroenterology) Iva Boop, MD (Gastroenterology)   Past Medical History:  Diagnosis Date   ADD 02/23/2008   ANXIETY 08/03/2007   ASTHMA 08/03/2007   Asthma    Cancer (HCC)    skin cancer    CHEST PAIN 11/17/2008   Closed fracture of four ribs 06/30/2008   DEPRESSION 08/03/2007   ERECTILE DYSFUNCTION, ORGANIC 02/23/2008   Failed moderate sedation during procedure 11/02/2008   GERD 08/03/2007   HAND PAIN, LEFT 06/20/2009   Heart murmur    HIP PAIN, RIGHT 05/27/2008   HYPERLIPIDEMIA 08/03/2007   HYPERTENSION 08/03/2007   Irritable bowel syndrome 11/02/2008   OSTEOARTHRITIS 11/02/2008   OSTEOPENIA 07/26/2008   Pleurisy    RENAL DISEASE, CHRONIC, MILD 11/04/2008   UNIVERSAL ULCERATIVE COLITIS 01/26/2009    Past Surgical History:  Procedure Laterality Date   ANTERIOR FUSION CERVICAL SPINE  12/30/2010   BASAL CELL CARCINOMA EXCISION Left    ear, head   CARPAL TUNNEL RELEASE  12/30/2010   CERVICAL FUSION  04/30/2015   C7   COLONOSCOPY W/ BIOPSIES  06/2001, 08/2006, 07/2008   colon polyp, ulcerative colitis, lymphoid aggregates   ELBOW SURGERY  01/30/2002   left   HAND SURGERY  02/27/2010   basal thumb joint removal and tendon transplant([facility])   INCISION AND  DRAINAGE PERIRECTAL ABSCESS  12/30/1996   LUMBAR DISC SURGERY  12/30/1990   LUMBAR LAMINECTOMY/DECOMPRESSION MICRODISCECTOMY N/A 07/18/2022   Procedure: Central decompression microdiscectomy Lumbsr two-three;  Surgeon: Jene Every, MD;  Location: [facility] OR;  Service: Orthopedics;  Laterality: N/A;   SHOULDER SURGERY Bilateral 09/30/2007   injury at work, dislocated   TONSILLECTOMY     removed as a child   UPPER GASTROINTESTINAL ENDOSCOPY  06/29/2001   2cm hiatal hernia    Social History   Socioeconomic History   Marital status: Married    Spouse name: Not on file   Number of children: 3   Years of education: Not on file   Highest education level: GED or equivalent  Occupational History   Not on file  Tobacco Use   Smoking status: Never   Smokeless tobacco: Never  Vaping Use   Vaping status: Never Used  Substance and Sexual Activity   Alcohol use: Not Currently   Drug use: No   Sexual activity: Not on file  Other Topics Concern   Not on file  Social History Narrative   Not on file   Social Drivers of Health   Financial Resource Strain: Low Risk  (08/26/2022)   Overall Financial Resource Strain (CARDIA)    Difficulty of Paying Living Expenses: Not hard at all  Food Insecurity: No Food Insecurity (08/26/2022)   Hunger Vital Sign    Worried About Running Out of Food in the Last Year: Never true    Ran Out of Food in the Last Year:  Never true  Transportation Needs: No Transportation Needs (08/26/2022)   PRAPARE - Administrator, Civil Service (Medical): No    Lack of Transportation (Non-Medical): No  Physical Activity: Unknown (08/26/2022)   Exercise Vital Sign    Days of Exercise per Week: 0 days    Minutes of Exercise per Session: Not on file  Stress: No Stress Concern Present (08/26/2022)   [employer] of Occupational Health - Occupational Stress Questionnaire    Feeling of Stress : Not at all  Social Connections: Socially Integrated (08/26/2022)    Social Connection and Isolation Panel [NHANES]    Frequency of Communication with Friends and Family: More than three times a week    Frequency of Social Gatherings with Friends and Family: Once a week    Attends Religious Services: More than 4 times per year    Active Member of [employer] or Organizations: Yes    Attends Engineer, structural: More than 4 times per year    Marital Status: Married  Catering manager Violence: Not on file    Family History  Problem Relation Age of Onset   Heart disease Mother        s/p CABG age 64   Cancer Father        colon , brain   Colon cancer Father 58   COPD Sister    Heart disease Brother        CAD   Seizures Brother 45   Colon polyps Neg Hx    Esophageal cancer Neg Hx    Rectal cancer Neg Hx    Stomach cancer Neg Hx     Current Outpatient Medications on File Prior to Visit  Medication Sig Dispense Refill   acetaminophen (TYLENOL) 650 MG CR tablet Take 1,300 mg by mouth at bedtime as needed for pain.     albuterol (PROAIR HFA) 108 (90 Base) MCG/ACT inhaler Inhale 2 puffs into the lungs every 6 (six) hours as needed for wheezing or shortness of breath. INHALE 2 PUFFS INTO THE LUNGS 4 TIMES DAILY 25.5 each 0   allopurinol (ZYLOPRIM) 300 MG tablet Take 1 tablet (300 mg total) by mouth daily. 90 tablet 0   ALPRAZolam (XANAX) 0.25 MG tablet TAKE 1 TABLET BY MOUTH EVERY NIGHT AT BEDTIME AS NEEDED FOR ANXIETY AND TAKE 1/2 TABLET DAILY AS NEEDED 30 tablet 1   amLODipine (NORVASC) 5 MG tablet TAKE 1 TABLET BY MOUTH DAILY 90 tablet 0   amphetamine-dextroamphetamine (ADDERALL) 5 MG tablet Take 1 tablet (5 mg total) by mouth daily. 30 tablet 0   amphetamine-dextroamphetamine (ADDERALL) 5 MG tablet Take 1 tablet (5 mg total) by mouth daily. 30 tablet 0   amphetamine-dextroamphetamine (ADDERALL) 5 MG tablet Take 1 tablet (5 mg total) by mouth daily. 30 tablet 0   Ascorbic Acid (VITAMIN C) 100 MG tablet Take 500 mg by mouth daily.     atorvastatin  (LIPITOR) 40 MG tablet TAKE 1 TABLET BY MOUTH DAILY 100 tablet 0   buPROPion (WELLBUTRIN XL) 300 MG 24 hr tablet TAKE 1 TABLET BY MOUTH DAILY 100 tablet 0   colchicine 0.6 MG tablet TAKE 1 TABLET BY MOUTH 2 TIMES A DAY 60 tablet 0   Cyanocobalamin (B-12) 3000 MCG CAPS Take 3,000 mcg by mouth daily.     dicyclomine (BENTYL) 20 MG tablet TAKE 1 TABLET BY MOUTH EVERY 6  HOURS (Patient taking differently: Take 20 mg by mouth daily.) 360 tablet 1   FARXIGA 10 MG TABS  tablet Take 10 mg by mouth daily.     fexofenadine (ALLEGRA) 180 MG tablet Take 180 mg by mouth daily as needed for allergies.     fluticasone (FLONASE) 50 MCG/ACT nasal spray Place 1 spray into both nostrils daily as needed for allergies or rhinitis.     Fluticasone Furoate (ARNUITY ELLIPTA) 50 MCG/ACT AEPB USE 1 INHALATION BY MOUTH DAILY 90 each 0   ketoconazole (NIZORAL) 2 % cream Apply 1 Application topically daily. 60 g 2   Multiple Vitamin (MULTIVITAMIN WITH MINERALS) TABS tablet Take 1 tablet by mouth daily.     NON FORMULARY Cherry juice     Olopatadine HCl (PATADAY OP) Place 1 drop into both eyes daily as needed (allergies / irritation).     omeprazole (PRILOSEC) 20 MG capsule TAKE 1 CAPSULE BY MOUTH DAILY 100 capsule 0   polyethylene glycol (MIRALAX / GLYCOLAX) 17 g packet Take 17 g by mouth daily. 14 each 0   Simethicone (GAS-X PO) Take 2 tablets by mouth daily as needed (gas).     terbinafine (LAMISIL) 250 MG tablet Take 1 tablet (250 mg total) by mouth daily. 30 tablet 0   No current facility-administered medications on file prior to visit.    Allergies  Allergen Reactions   Novocain [Procaine] Palpitations    Lightheadedness    Promethazine Other (See Comments)    Family members had hallucinations when taking this, pt prefers to avoid this drug   Amlodipine Swelling    Feet swelling   Aspirin     nose bleeds    Mercaptopurine     REACTION: myalgias   Tape     Tears skins, tolerates cloth and paper tape         Physical Exam Vitals requested from patient and listed below if patient had equipment and was able to obtain at home for this virtual visit: Vitals:   04/01/24 1142  BP: (!) 142/72   Estimated body mass index is 25.29 kg/m as calculated from the following:   Height as of 11/10/23: 5\' 5"  (1.651 m).   Weight as of this encounter: 152 lb (68.9 kg).  EKG (optional): deferred due to virtual visit  GENERAL: alert, oriented, no acute distress detected; full vision exam deferred due to pandemic and/or virtual encounter  HEENT: atraumatic, conjunttiva clear, no obvious abnormalities on inspection of external nose and ears  NECK: normal movements of the head and neck  LUNGS: on inspection no signs of respiratory distress, breathing rate appears normal, no obvious gross SOB, gasping or wheezing  CV: no obvious cyanosis  MS: moves all visible extremities without noticeable abnormality  PSYCH/NEURO: pleasant and cooperative, no obvious depression or anxiety, speech and thought processing grossly intact, Cognitive function grossly intact  Flowsheet Row Video Visit from [facility]/02/2023 in [facility]  PHQ-9 Total Score 6           04/01/2024   11:31 AM 12/02/2023    2:04 PM 11/10/2023   11:22 AM 04/03/2023    3:14 PM 11/18/2022    4:31 PM  Depression screen PHQ 2/9  Decreased Interest 0 1 3  0  Down, Depressed, Hopeless 0 1 1  0  PHQ - 2 Score 0 2 4  0  Altered sleeping  1 0 0 0  Tired, decreased energy  3 3 1 1   Change in appetite  0 0 1 0  Feeling bad or failure about yourself   0 0 0 0  Trouble concentrating  0 0 0 0  Moving slowly or fidgety/restless  0 0 0 0  Suicidal thoughts  0 0 0 0  PHQ-9 Score  6 7  1   Difficult doing work/chores   Not difficult at all Not difficult at all Not difficult at all       10/28/2022    9:30 AM 11/18/2022    4:33 PM 04/03/2023    3:14 PM 12/02/2023    2:04 PM 04/01/2024   11:31 AM  Fall Risk  Falls in the  past year? 0 0 0 0 0  Was there an injury with Fall? 0 0 0 0 0  Fall Risk Category Calculator 0 0 0 0 0  Fall Risk Category (Retired) Low Low     (RETIRED) Patient Fall Risk Level Low fall risk Low fall risk     Patient at Risk for Falls Due to No Fall Risks No Fall Risks     Fall risk Follow up Falls evaluation completed Falls evaluation completed Falls evaluation completed Falls evaluation completed Education provided;Falls evaluation completed     SUMMARY AND PLAN:  Encounter for Medicare annual wellness exam  Discussed applicable health maintenance/preventive health measures and advised and referred or ordered per patient preferences: -per GI due for office visit, advised patient to call and provided number  -discussed covid vaccine recs/risks and advised can do at the pharmacy  Health Maintenance  Topic Date Due   COVID-19 Vaccine (4 - 2024-25 season) 08/31/2023   Colonoscopy  02/20/2024   INFLUENZA VACCINE  07/30/2024   Medicare Annual Wellness (AWV)  04/01/2025   DTaP/Tdap/Td (3 - Td or Tdap) 09/01/2032   Pneumonia Vaccine 52+ Years old  Completed   Hepatitis C Screening  Completed   Zoster Vaccines- Shingrix  Completed   HPV VACCINES  Aged [employer] and counseling on the following was provided based on the above review of health and a plan/checklist for the patient, along with additional information discussed, was provided for the patient in the patient instructions :  -Provided safe balance exercises that can be done at home to improve balance and discussed exercise guidelines for adults with include balance exercises at least 3 days per week.  -Advised and counseled on a healthy lifestyle  -Reviewed patient's current diet. Advised and counseled on a whole foods based healthy diet. A summary of a healthy diet was provided in the Patient Instructions. Discussed specifically foods to consume/avoid in the management of HTN and gout.  -reviewed patient's current  physical activity level and discussed exercise guidelines for adults. Discussed community resources and ideas for safe exercise at home to assist in meeting exercise guideline recommendations in a safe and healthy way.  -advised taking norvasc in the am, monitoring BP, EM LM recs for HTN and scheduling f/u with PCP in the next few weeks to recheck in office. Sent message to Dr. Ardyth Harps and sent message to schedulers to assist in scheduling follow up. He also wanted me to let Dr. Ardyth Harps know he needs a refill of the adderall - included that in message.  -Advise yearly dental visits at minimum and regular eye exams   Follow up: see patient instructions   Patient Instructions  I really enjoyed getting to talk with you today! I am available on Tuesdays and Thursdays for virtual visits if you have any questions or concerns, or if I can be of any further assistance.   CHECKLIST FROM ANNUAL WELLNESS VISIT:  -  Follow up (please call to schedule if not scheduled after visit):   -schedule in office visit with Dr. Ardyth Harps in the next few weeks to follow up on Blood Pressure, gout and ADHD   -yearly for annual wellness visit with primary care office  Here is a list of your preventive care/health maintenance measures and the plan for each if any are due:  PLAN For any measures below that may be due:  -Please call your gastroenterologist about colon cancer screening @ [phone] -can get covid vaccine at the pharmacy   Health Maintenance  Topic Date Due   COVID-19 Vaccine (4 - 2024-25 season) 08/31/2023   Colonoscopy  02/20/2024   INFLUENZA VACCINE  07/30/2024   Medicare Annual Wellness (AWV)  04/01/2025   DTaP/Tdap/Td (3 - Td or Tdap) 09/01/2032   Pneumonia Vaccine 13+ Years old  Completed   Hepatitis C Screening  Completed   Zoster Vaccines- Shingrix  Completed   HPV VACCINES  Aged Out    -See a dentist at least yearly  -Get your eyes checked and then per your eye specialist's  recommendations  -Other issues addressed today:   -I have included below further information regarding a healthy whole foods based diet, physical activity guidelines for adults, stress management and opportunities for social connections. I hope you find this information useful.   -----------------------------------------------------------------------------------------------------------------------------------------------------------------------------------------------------------------------------------------------------------    NUTRITION: -eat real food: lots of colorful vegetables (half the plate) and fruits -5-7 servings of vegetables and fruits per day (fresh or steamed is best), exp. 2 servings of vegetables with lunch and dinner and 2 servings of fruit per day. Berries and greens such as kale and collards are great choices.  -consume on a regular basis:  fresh fruits, fresh veggies, fish, nuts, seeds, healthy oils (such as olive oil, avocado oil), whole grains (make sure for bread/pasta/crackers/etc., that the first ingredient on label contains the word "whole"), legumes. -can eat small amounts of dairy and lean meat (no larger than the palm of your hand), but avoid processed meats such as ham, bacon, lunch meat, etc. -drink water -try to avoid fast food and pre-packaged foods, processed meat, ultra processed foods/beverages (donuts, candy, etc.) -most experts advise limiting sodium to < 2300mg  per day, should limit further is any chronic conditions such as high blood pressure, heart disease, diabetes, etc. The American Heart Association advised that < 1500mg  is is ideal -try to avoid foods/beverages that contain any ingredients with names you do not recognize  -try to avoid foods/beverages  with added sugar or sweeteners/sweets  -try to avoid sweet drinks (including diet drinks): soda, juice, Gatorade, sweet tea, power drinks, diet drinks -try to avoid white rice, white bread, pasta  (unless whole grain)  EXERCISE GUIDELINES FOR ADULTS: -if you wish to increase your physical activity, do so gradually and with the approval of your doctor -STOP and seek medical care immediately if you have any chest pain, chest discomfort or trouble breathing when starting or increasing exercise  -move and stretch your body, legs, feet and arms when sitting for long periods -Physical activity guidelines for optimal health in adults: -get at least 150 minutes per week of moderate exercise (can talk, but not sing); this is about 20-30 minutes of sustained activity 5-7 days per week or two 10-15 minute episodes of sustained activity 5-7 days per week -do some muscle building/resistance training/strength training at least 2 days per week  -balance exercises 3+ days per week:   Stand somewhere where you have something sturdy  to hold onto if you lose balance    1) lift up on toes, then back down, start with 5x per day and work up to 20x   2) stand and lift one leg straight out to the side so that foot is a few inches of the floor, start with 5x each side and work up to 20x each side   3) stand on one foot, start with 5 seconds each side and work up to 20 seconds on each side  If you need ideas or help with getting more active:  -Silver sneakers https://tools.silversneakers.com  -Walk with a Doc: http://www.duncan-williams.com/  -try to include resistance (weight lifting/strength building) and balance exercises twice per week: or the following link for ideas: http://castillo-powell.com/  BuyDucts.dk  STRESS MANAGEMENT: -can try meditating, or just sitting quietly with deep breathing while intentionally relaxing all parts of your body for 5 minutes daily -if you need further help with stress, anxiety or depression please follow up with your primary doctor or contact the wonderful folks at [facility]: [phone]  SOCIAL CONNECTIONS: -options in [place] if you wish to engage in more social and exercise related activities:  -Silver sneakers https://tools.silversneakers.com  -Walk with a Doc: http://www.duncan-williams.com/  -Check out the [facility] Active Adults 50+ section on the [place] [employer] (hiking clubs, book clubs, cards and games, chess, exercise classes, aquatic classes and much more) - see the website for details: https://www.[place].gov/departments/parks-recreation/active-adults50  -YouTube has lots of exercise videos for different ages and abilities as well  -[facility] (a variety of indoor and outdoor inperson activities for adults). [phone]. [address].  -Virtual Online Classes (a variety of topics): see seniorplanet.org or call [phone]  -consider volunteering at a school, hospice center, church, senior center or elsewhere            Terressa Koyanagi, DO

## 2024-04-01 NOTE — Patient Instructions (Addendum)
 I really enjoyed getting to talk with you today! I am available on Tuesdays and Thursdays for virtual visits if you have any questions or concerns, or if I can be of any further assistance.   CHECKLIST FROM ANNUAL WELLNESS VISIT:  -Follow up (please call to schedule if not scheduled after visit):   -schedule in office visit with Dr. Ardyth Harps in the next few weeks to follow up on Blood Pressure, gout and ADHD   -yearly for annual wellness visit with primary care office  Here is a list of your preventive care/health maintenance measures and the plan for each if any are due:  PLAN For any measures below that may be due:  -Please call your gastroenterologist about colon cancer screening @ [phone] -can get covid vaccine at the pharmacy   Health Maintenance  Topic Date Due   COVID-19 Vaccine (4 - 2024-25 season) 08/31/2023   Colonoscopy  02/20/2024   INFLUENZA VACCINE  07/30/2024   Medicare Annual Wellness (AWV)  04/01/2025   DTaP/Tdap/Td (3 - Td or Tdap) 09/01/2032   Pneumonia Vaccine 29+ Years old  Completed   Hepatitis C Screening  Completed   Zoster Vaccines- Shingrix  Completed   HPV VACCINES  Aged Out    -See a dentist at least yearly  -Get your eyes checked and then per your eye specialist's recommendations  -Other issues addressed today:   -I have included below further information regarding a healthy whole foods based diet, physical activity guidelines for adults, stress management and opportunities for social connections. I hope you find this information useful.   -----------------------------------------------------------------------------------------------------------------------------------------------------------------------------------------------------------------------------------------------------------    NUTRITION: -eat real food: lots of colorful vegetables (half the plate) and fruits -5-7 servings of vegetables and fruits per day (fresh or steamed  is best), exp. 2 servings of vegetables with lunch and dinner and 2 servings of fruit per day. Berries and greens such as kale and collards are great choices.  -consume on a regular basis:  fresh fruits, fresh veggies, fish, nuts, seeds, healthy oils (such as olive oil, avocado oil), whole grains (make sure for bread/pasta/crackers/etc., that the first ingredient on label contains the word "whole"), legumes. -can eat small amounts of dairy and lean meat (no larger than the palm of your hand), but avoid processed meats such as ham, bacon, lunch meat, etc. -drink water -try to avoid fast food and pre-packaged foods, processed meat, ultra processed foods/beverages (donuts, candy, etc.) -most experts advise limiting sodium to < 2300mg  per day, should limit further is any chronic conditions such as high blood pressure, heart disease, diabetes, etc. The American Heart Association advised that < 1500mg  is is ideal -try to avoid foods/beverages that contain any ingredients with names you do not recognize  -try to avoid foods/beverages  with added sugar or sweeteners/sweets  -try to avoid sweet drinks (including diet drinks): soda, juice, Gatorade, sweet tea, power drinks, diet drinks -try to avoid white rice, white bread, pasta (unless whole grain)  EXERCISE GUIDELINES FOR ADULTS: -if you wish to increase your physical activity, do so gradually and with the approval of your doctor -STOP and seek medical care immediately if you have any chest pain, chest discomfort or trouble breathing when starting or increasing exercise  -move and stretch your body, legs, feet and arms when sitting for long periods -Physical activity guidelines for optimal health in adults: -get at least 150 minutes per week of moderate exercise (can talk, but not sing); this is about 20-30 minutes of sustained activity 5-7 days  per week or two 10-15 minute episodes of sustained activity 5-7 days per week -do some muscle  building/resistance training/strength training at least 2 days per week  -balance exercises 3+ days per week:   Stand somewhere where you have something sturdy to hold onto if you lose balance    1) lift up on toes, then back down, start with 5x per day and work up to 20x   2) stand and lift one leg straight out to the side so that foot is a few inches of the floor, start with 5x each side and work up to 20x each side   3) stand on one foot, start with 5 seconds each side and work up to 20 seconds on each side  If you need ideas or help with getting more active:  -Silver sneakers https://tools.silversneakers.com  -Walk with a Doc: http://www.duncan-williams.com/  -try to include resistance (weight lifting/strength building) and balance exercises twice per week: or the following link for ideas: http://castillo-powell.com/  BuyDucts.dk  STRESS MANAGEMENT: -can try meditating, or just sitting quietly with deep breathing while intentionally relaxing all parts of your body for 5 minutes daily -if you need further help with stress, anxiety or depression please follow up with your primary doctor or contact the wonderful folks at [facility]: [phone]  SOCIAL CONNECTIONS: -options in [place] if you wish to engage in more social and exercise related activities:  -Silver sneakers https://tools.silversneakers.com  -Walk with a Doc: http://www.duncan-williams.com/  -Check out the [facility] Active Adults 50+ section on the [place] [employer] (hiking clubs, book clubs, cards and games, chess, exercise classes, aquatic classes and much more) - see the website for details: https://www.[place].gov/departments/parks-recreation/active-adults50  -YouTube has lots of exercise videos for different ages and abilities as well  -[facility] (a variety of indoor and outdoor  inperson activities for adults). [phone]. [address]  -Virtual Online Classes (a variety of topics): see seniorplanet.org or call 6041675744  -consider volunteering at a school, hospice center, church, senior center or elsewhere

## 2024-04-01 NOTE — Telephone Encounter (Signed)
 Copied from CRM [id number]. Topic: Clinical - Medication Refill >> Apr 01, 2024 12:22 PM [place] A wrote: Most Recent Primary Care Visit:  Provider: Terressa Koyanagi  Department: [facility]  Visit Type: ANNUAL WELL VISIT, SEQUENTIAL  Date: 04/01/2024  Medication: amphetamine-dextroamphetamine (ADDERALL) 5 MG tablet  Has the patient contacted their pharmacy? Yes (Agent: If no, request that the patient contact the pharmacy for the refill. If patient does not wish to contact the pharmacy document the reason why and proceed with request.) (Agent: If yes, when and what did the pharmacy advise?) I could be ready by tomorrow  Is this the correct pharmacy for this prescription? Yes If no, delete pharmacy and type the correct one.  This is the patient's preferred pharmacy:  [facility] [id number] - [place], [place] - [address] [place] [address] Phone: [phone] Fax: [phone]  [facility] - [place], [place] - [address] [place] [address] Phone: [phone] Fax: [phone]   Has the prescription been filled recently? No  Is the patient out of the medication? Yes  Has the patient been seen for an appointment in the last year OR does the patient have an upcoming appointment? Yes  Can we respond through MyChart? Yes  Agent: Please be advised that Rx refills may take up to 3 business days. We ask that you follow-up with your pharmacy.

## 2024-04-01 NOTE — Telephone Encounter (Signed)
 Copied from CRM [id number]. Topic: General - Call Back - No Documentation >> Apr 01, 2024  1:30 PM Taleah C wrote: Reason for CRM: pt called and stated that he missed a call from someone in the office. However, no VM was left. Please advise.

## 2024-04-01 NOTE — Telephone Encounter (Signed)
 See refill request.

## 2024-04-01 NOTE — Telephone Encounter (Signed)
 Pt has an appt with dr Ardyth Harps on 04-07-2023

## 2024-04-01 NOTE — Telephone Encounter (Signed)
 Patient has an appointment 04/06/24.

## 2024-04-01 NOTE — Telephone Encounter (Signed)
 Pt need a refill on amphetamine-dextroamphetamine (ADDERALL) 5 MG tablet  [facility] [id number] - [place],  - [address] Phone: [phone]  Fax: [phone]    Pt has an appt n 04-06-2024

## 2024-04-02 MED ORDER — AMPHETAMINE-DEXTROAMPHETAMINE 5 MG PO TABS
5.0000 mg | ORAL_TABLET | Freq: Every day | ORAL | 0 refills | Status: DC
Start: 2024-04-02 — End: 2024-04-06

## 2024-04-02 MED ORDER — AMPHETAMINE-DEXTROAMPHETAMINE 5 MG PO TABS: 5.0000 mg | ORAL_TABLET | Freq: Every day | ORAL | 0 refills | Status: DC

## 2024-04-06 ENCOUNTER — Encounter: Payer: Self-pay | Admitting: Internal Medicine

## 2024-04-06 ENCOUNTER — Ambulatory Visit (INDEPENDENT_AMBULATORY_CARE_PROVIDER_SITE_OTHER): Admitting: Internal Medicine

## 2024-04-06 ENCOUNTER — Other Ambulatory Visit: Payer: Self-pay | Admitting: Internal Medicine

## 2024-04-06 VITALS — BP 108/72 | HR 74 | Temp 98.3°F | Wt 145.4 lb

## 2024-04-06 DIAGNOSIS — M1A9XX0 Chronic gout, unspecified, without tophus (tophi): Secondary | ICD-10-CM

## 2024-04-06 DIAGNOSIS — I10 Essential (primary) hypertension: Secondary | ICD-10-CM

## 2024-04-06 DIAGNOSIS — F900 Attention-deficit hyperactivity disorder, predominantly inattentive type: Secondary | ICD-10-CM | POA: Diagnosis not present

## 2024-04-06 MED ORDER — AMPHETAMINE-DEXTROAMPHETAMINE 5 MG PO TABS: 5.0000 mg | ORAL_TABLET | Freq: Every day | ORAL | 0 refills | Status: DC

## 2024-04-06 MED ORDER — AMLODIPINE BESYLATE 5 MG PO TABS: 5.0000 mg | ORAL_TABLET | Freq: Every day | ORAL | 0 refills | Status: DC

## 2024-04-06 MED ORDER — COLCHICINE 0.6 MG PO TABS: 0.6000 mg | ORAL_TABLET | Freq: Two times a day (BID) | ORAL | 1 refills | Status: DC

## 2024-04-06 NOTE — Progress Notes (Signed)
 Established Patient Office Visit     CC/Reason for Visit: Follow-up chronic conditions, medication refills  HPI: Joseph Mejia is a 78 y.o. male who is coming in today for the above mentioned reasons. Past Medical History is significant for: ADHD, gout.  He is needing refills of Adderall and of his colchicine.  He is otherwise doing well.   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   Failed moderate sedation during procedure 11/02/2008   GERD 08/03/2007   HAND PAIN, LEFT 06/20/2009   Heart murmur    HIP PAIN, RIGHT 05/27/2008   HYPERLIPIDEMIA 08/03/2007   HYPERTENSION 08/03/2007   Irritable bowel syndrome 11/02/2008   OSTEOARTHRITIS 11/02/2008   OSTEOPENIA 07/26/2008   Pleurisy    RENAL DISEASE, CHRONIC, MILD 11/04/2008   UNIVERSAL ULCERATIVE COLITIS 01/26/2009    Past Surgical History:  Procedure Laterality Date   ANTERIOR FUSION CERVICAL SPINE  12/30/2010   BASAL CELL CARCINOMA EXCISION Left    ear, head   CARPAL TUNNEL RELEASE  12/30/2010   CERVICAL FUSION  04/30/2015   C7   COLONOSCOPY W/ BIOPSIES  06/2001, 08/2006, 07/2008   colon polyp, ulcerative colitis, lymphoid aggregates   ELBOW SURGERY  01/30/2002   left   HAND SURGERY  02/27/2010   basal thumb joint removal and tendon transplant([facility])   INCISION AND DRAINAGE PERIRECTAL ABSCESS  12/30/1996   LUMBAR DISC SURGERY  12/30/1990   LUMBAR LAMINECTOMY/DECOMPRESSION MICRODISCECTOMY N/A 07/18/2022   Procedure: Central decompression microdiscectomy Lumbsr two-three;  Surgeon: Jene Every, MD;  Location: [facility] OR;  Service: Orthopedics;  Laterality: N/A;   SHOULDER SURGERY Bilateral 09/30/2007   injury at work, dislocated   TONSILLECTOMY      removed as a child   UPPER GASTROINTESTINAL ENDOSCOPY  06/29/2001   2cm hiatal hernia    Social History:  reports that he has never smoked. He has never used smokeless tobacco. He reports that he does not currently use alcohol. He reports that he does not use drugs.  Allergies: Allergies  Allergen Reactions   Novocain [Procaine] Palpitations    Lightheadedness    Promethazine Other (See Comments)    Family members had hallucinations when taking this, pt prefers to avoid this drug   Amlodipine Swelling    Feet swelling   Aspirin     nose bleeds    Mercaptopurine     REACTION: myalgias   Tape     Tears skins, tolerates cloth and paper tape     Family History:  Family History  Problem Relation Age of Onset   Heart disease Mother        s/p CABG age 95   Cancer Father        colon , brain   Colon cancer Father 29   COPD Sister    Heart disease Brother        CAD   Seizures Brother 9   Colon polyps Neg Hx    Esophageal cancer Neg Hx    Rectal cancer Neg Hx    Stomach cancer Neg Hx      Current Outpatient Medications:    acetaminophen (TYLENOL) 650 MG CR tablet, Take 1,300 mg by mouth at  bedtime as needed for pain., Disp: , Rfl:    albuterol (PROAIR HFA) 108 (90 Base) MCG/ACT inhaler, Inhale 2 puffs into the lungs every 6 (six) hours as needed for wheezing or shortness of breath. INHALE 2 PUFFS INTO THE LUNGS 4 TIMES DAILY, Disp: 25.5 each, Rfl: 0   allopurinol (ZYLOPRIM) 300 MG tablet, Take 1 tablet (300 mg total) by mouth daily., Disp: 90 tablet, Rfl: 0   ALPRAZolam (XANAX) 0.25 MG tablet, TAKE 1 TABLET BY MOUTH EVERY NIGHT AT BEDTIME AS NEEDED FOR ANXIETY AND TAKE 1/2 TABLET DAILY AS NEEDED, Disp: 30 tablet, Rfl: 1   Ascorbic Acid (VITAMIN C) 100 MG tablet, Take 500 mg by mouth daily., Disp: , Rfl:    atorvastatin (LIPITOR) 40 MG tablet, TAKE 1 TABLET BY MOUTH DAILY, Disp: 100 tablet, Rfl: 0   buPROPion (WELLBUTRIN XL) 300 MG 24 hr tablet, TAKE 1 TABLET BY MOUTH  DAILY, Disp: 100 tablet, Rfl: 0   Cyanocobalamin (B-12) 3000 MCG CAPS, Take 3,000 mcg by mouth daily., Disp: , Rfl:    dicyclomine (BENTYL) 20 MG tablet, TAKE 1 TABLET BY MOUTH EVERY 6  HOURS (Patient taking differently: Take 20 mg by mouth daily.), Disp: 360 tablet, Rfl: 1   FARXIGA 10 MG TABS tablet, Take 10 mg by mouth daily., Disp: , Rfl:    fexofenadine (ALLEGRA) 180 MG tablet, Take 180 mg by mouth daily as needed for allergies., Disp: , Rfl:    fluticasone (FLONASE) 50 MCG/ACT nasal spray, Place 1 spray into both nostrils daily as needed for allergies or rhinitis., Disp: , Rfl:    Fluticasone Furoate (ARNUITY ELLIPTA) 50 MCG/ACT AEPB, USE 1 INHALATION BY MOUTH DAILY, Disp: 90 each, Rfl: 0   ketoconazole (NIZORAL) 2 % cream, Apply 1 Application topically daily., Disp: 60 g, Rfl: 2   Multiple Vitamin (MULTIVITAMIN WITH MINERALS) TABS tablet, Take 1 tablet by mouth daily., Disp: , Rfl:    NON FORMULARY, Cherry juice, Disp: , Rfl:    Olopatadine HCl (PATADAY OP), Place 1 drop into both eyes daily as needed (allergies / irritation)., Disp: , Rfl:    omeprazole (PRILOSEC) 20 MG capsule, TAKE 1 CAPSULE BY MOUTH DAILY, Disp: 100 capsule, Rfl: 0   polyethylene glycol (MIRALAX / GLYCOLAX) 17 g packet, Take 17 g by mouth daily., Disp: 14 each, Rfl: 0   Simethicone (GAS-X PO), Take 2 tablets by mouth daily as needed (gas)., Disp: , Rfl:    terbinafine (LAMISIL) 250 MG tablet, Take 1 tablet (250 mg total) by mouth daily., Disp: 30 tablet, Rfl: 0   amLODipine (NORVASC) 5 MG tablet, Take 1 tablet (5 mg total) by mouth daily., Disp: 90 tablet, Rfl: 0   amphetamine-dextroamphetamine (ADDERALL) 5 MG tablet, Take 1 tablet (5 mg total) by mouth daily., Disp: 30 tablet, Rfl: 0   amphetamine-dextroamphetamine (ADDERALL) 5 MG tablet, Take 1 tablet (5 mg total) by mouth daily., Disp: 30 tablet, Rfl: 0   amphetamine-dextroamphetamine (ADDERALL) 5 MG tablet, Take 1 tablet (5 mg total) by mouth daily., Disp: 30 tablet,  Rfl: 0   colchicine 0.6 MG tablet, Take 1 tablet (0.6 mg total) by mouth 2 (two) times daily., Disp: 60 tablet, Rfl: 1  Review of Systems:  Negative unless indicated in HPI.   Physical Exam: Vitals:   04/06/24 0932  BP: 108/72  Pulse: 74  Temp: 98.3 F (36.8 C)  TempSrc: Oral  SpO2: 94%  Weight: 145 lb 6.4 oz (66 kg)    Body mass index is 24.2  kg/m.   Physical Exam   Impression and Plan:  Chronic gout without tophus, unspecified cause, unspecified site -     Colchicine; Take 1 tablet (0.6 mg total) by mouth 2 (two) times daily.  Dispense: 60 tablet; Refill: 1  Attention deficit hyperactivity disorder (ADHD), predominantly inattentive type -     Amphetamine-Dextroamphetamine; Take 1 tablet (5 mg total) by mouth daily.  Dispense: 30 tablet; Refill: 0 -     Amphetamine-Dextroamphetamine; Take 1 tablet (5 mg total) by mouth daily.  Dispense: 30 tablet; Refill: 0 -     Amphetamine-Dextroamphetamine; Take 1 tablet (5 mg total) by mouth daily.  Dispense: 30 tablet; Refill: 0  Essential hypertension -     amLODIPine Besylate; Take 1 tablet (5 mg total) by mouth daily.  Dispense: 90 tablet; Refill: 0  -Blood pressure is well-controlled, continue amlodipine. -Colchicine refilled for gout flareups as needed. -PDMP reviewed, no red flags, overdose risk score is 90. -Refill Adderall 5 mg daily for 30 tablets a month x 3 months for his ADHD.   Time spent:31 minutes reviewing chart, interviewing and examining patient and formulating plan of care.     Chaya Jan, MD [facility]

## 2024-04-26 ENCOUNTER — Other Ambulatory Visit: Payer: Self-pay | Admitting: Internal Medicine

## 2024-04-26 DIAGNOSIS — M1A9XX0 Chronic gout, unspecified, without tophus (tophi): Secondary | ICD-10-CM

## 2024-05-12 ENCOUNTER — Other Ambulatory Visit: Payer: Self-pay | Admitting: Internal Medicine

## 2024-05-12 DIAGNOSIS — J4520 Mild intermittent asthma, uncomplicated: Secondary | ICD-10-CM

## 2024-05-14 ENCOUNTER — Other Ambulatory Visit: Payer: Self-pay | Admitting: Internal Medicine

## 2024-05-15 ENCOUNTER — Other Ambulatory Visit: Payer: Self-pay | Admitting: Internal Medicine

## 2024-05-17 ENCOUNTER — Other Ambulatory Visit: Payer: Self-pay | Admitting: Internal Medicine

## 2024-05-17 DIAGNOSIS — M1A9XX0 Chronic gout, unspecified, without tophus (tophi): Secondary | ICD-10-CM

## 2024-05-27 ENCOUNTER — Other Ambulatory Visit: Payer: Self-pay | Admitting: Internal Medicine

## 2024-05-27 DIAGNOSIS — M1A9XX0 Chronic gout, unspecified, without tophus (tophi): Secondary | ICD-10-CM

## 2024-06-01 DIAGNOSIS — N1832 Chronic kidney disease, stage 3b: Secondary | ICD-10-CM | POA: Diagnosis not present

## 2024-06-01 LAB — BASIC METABOLIC PANEL WITH GFR
BUN: 30 — AB (ref 4–21)
CO2: 21 (ref 13–22)
Chloride: 102 (ref 99–108)
Creatinine: 1.9 — AB (ref 0.6–1.3)
Glucose: 94
Potassium: 4.9 meq/L (ref 3.5–5.1)
Sodium: 141 (ref 137–147)

## 2024-06-01 LAB — CBC AND DIFFERENTIAL: Hemoglobin: 13.6 (ref 13.5–17.5)

## 2024-06-01 LAB — COMPREHENSIVE METABOLIC PANEL WITH GFR
Albumin: 4 (ref 3.5–5.0)
Calcium: 9 (ref 8.7–10.7)
eGFR: 36

## 2024-06-07 ENCOUNTER — Other Ambulatory Visit: Payer: Self-pay | Admitting: Internal Medicine

## 2024-06-07 DIAGNOSIS — I10 Essential (primary) hypertension: Secondary | ICD-10-CM

## 2024-06-09 DIAGNOSIS — L814 Other melanin hyperpigmentation: Secondary | ICD-10-CM | POA: Diagnosis not present

## 2024-06-09 DIAGNOSIS — D225 Melanocytic nevi of trunk: Secondary | ICD-10-CM | POA: Diagnosis not present

## 2024-06-09 DIAGNOSIS — L821 Other seborrheic keratosis: Secondary | ICD-10-CM | POA: Diagnosis not present

## 2024-06-09 DIAGNOSIS — L570 Actinic keratosis: Secondary | ICD-10-CM | POA: Diagnosis not present

## 2024-06-10 DIAGNOSIS — D631 Anemia in chronic kidney disease: Secondary | ICD-10-CM | POA: Diagnosis not present

## 2024-06-10 DIAGNOSIS — E785 Hyperlipidemia, unspecified: Secondary | ICD-10-CM | POA: Diagnosis not present

## 2024-06-10 DIAGNOSIS — E875 Hyperkalemia: Secondary | ICD-10-CM | POA: Diagnosis not present

## 2024-06-10 DIAGNOSIS — N2581 Secondary hyperparathyroidism of renal origin: Secondary | ICD-10-CM | POA: Diagnosis not present

## 2024-06-10 DIAGNOSIS — R809 Proteinuria, unspecified: Secondary | ICD-10-CM | POA: Diagnosis not present

## 2024-06-10 DIAGNOSIS — N1832 Chronic kidney disease, stage 3b: Secondary | ICD-10-CM | POA: Diagnosis not present

## 2024-06-10 DIAGNOSIS — I129 Hypertensive chronic kidney disease with stage 1 through stage 4 chronic kidney disease, or unspecified chronic kidney disease: Secondary | ICD-10-CM | POA: Diagnosis not present

## 2024-06-21 ENCOUNTER — Encounter: Payer: Self-pay | Admitting: Internal Medicine

## 2024-06-29 ENCOUNTER — Encounter: Payer: Self-pay | Admitting: Internal Medicine

## 2024-06-29 ENCOUNTER — Ambulatory Visit: Admitting: Internal Medicine

## 2024-06-29 VITALS — BP 120/60 | Ht 65.0 in | Wt 150.0 lb

## 2024-06-29 DIAGNOSIS — K219 Gastro-esophageal reflux disease without esophagitis: Secondary | ICD-10-CM | POA: Diagnosis not present

## 2024-06-29 DIAGNOSIS — K589 Irritable bowel syndrome without diarrhea: Secondary | ICD-10-CM

## 2024-06-29 DIAGNOSIS — K5100 Ulcerative (chronic) pancolitis without complications: Secondary | ICD-10-CM

## 2024-06-29 DIAGNOSIS — R0989 Other specified symptoms and signs involving the circulatory and respiratory systems: Secondary | ICD-10-CM | POA: Diagnosis not present

## 2024-06-29 NOTE — Patient Instructions (Addendum)
 Great to see you today.   You are a chronic throat clearer! You are not alone! The causes of chronic throat clearing include acid reflux (laryngopharyngeal reflux), allergies, environmental irritants such as tobacco smoke and air pollution, and asthma. If present for a long time throat clearing can become habit forming. When you clear your throat, you are transferring mucus from your throat up into your mouth and nose. We all secrete up to 2 liters (imagine a big Coke bottle) of mucus a day. This saliva is usually swallowed and ends up in the toilet eventually. By clearing the mucus back into your mouth and nose you are sending the saliva in the wrong direction. This is counterproductive. Unless you are walking around spitting all day (which most throat clearers do not do), the mucus will work its way back down to the throat and eventually be swallowed. Get the mucus going in the right direction. Swallow! Swallow! Swallow! No throat clearing.  Chronic throat clearing is damaging. The trauma from the throat clearing can cause redness and swelling of your vocal cords. If the clearing is very excessive small growths (granulomas) can form. These granulomas can get so large that they can eventually affect your breathing. Surgical removal may be necessary. The irritation and swelling produced by the clearing can cause saliva to sit in your throat. This causes more throat clearing. More throat clearing causes more stagnant mucus which causes more throat clearing, which causes more mucus, etc... A vicious cycle will ensue and the habit can be very difficult to break. Without your help and a conscious effort on your part to break the cycle, the throat clearing will never stop.  Your doctor may prescribe medication and behavioral modifications to treat acid reflux disease. Nose and throat sprays may be prescribed to treat underlying allergies or asthma. Avoiding possible irritants will be recommended. Without changes  to your behavior these treatments will not be successful. The following alterations are recommended:  Do not clear your throat. Swallow instead. This gets the mucus going in the right direction towards the toilet. Carry around some water to assist with swallowing and mucus clearance. When you feel the urge to clear your throat take a sip of the water. If you absolutely need to clear your throat perform a non-traumatic throat clear. To do this pant with your mouth open and say [place], [place] with a powerful but very breathy voice. This will clear the secretions without causing damage. Increase your water intake. This will thin secretions and make it easier to swallow. Comply with the behavior recommendations for reflux disease. Chew baking soda ([employer]) gum. This can be found on the internet or in the tooth paste isle of your pharmacy. Gum chewing can help with swallowing, reflux, and throat clearing. Chew three pieces a day. If you develop jaw discomfort or headaches decrease the amount of gum chewing. Tell your friends and family to tell you to swallow when you clear your throat. Some people have been clearing so long that they don't even know when they are doing it. Be patient. The urge to clear your throat will not go away overnight. It may take 8 or 12 weeks for the medication and behavior modifications to work.  Good Luck!    _______________________________________________________  If your blood pressure at your visit was 140/90 or greater, please contact your primary care physician to follow up on this.  _______________________________________________________  If you are age 65 or older, your body mass index  should be between 23-30. Your Body mass index is 24.96 kg/m. If this is out of the aforementioned range listed, please consider follow up with your Primary Care Provider.  If you are age 71 or younger, your body mass index should be between 19-25. Your Body mass index is 24.96  kg/m. If this is out of the aformentioned range listed, please consider follow up with your Primary Care Provider.   ________________________________________________________  The [facility] providers would like to encourage you to use MYCHART to communicate with providers for non-urgent requests or questions.  Due to long hold times on the telephone, sending your provider a message by [facility] may be a faster and more efficient way to get a response.  Please allow 48 business hours for a response.  Please remember that this is for non-urgent requests.  _______________________________________________________  I appreciate the opportunity to care for you. Lupita Commander, MD, [facility]

## 2024-06-29 NOTE — Progress Notes (Signed)
 Joseph Mejia 77 y.o. [birth year] [id number]  Assessment & Plan:   Encounter Diagnoses  Name Primary?   Chronic universal ulcerative colitis (HCC) Yes   Gastroesophageal reflux disease, unspecified whether esophagitis present    Irritable bowel syndrome    Chronic throat clearing     He is asymptomatic with respect of his ulcerative colitis and over the years and had normal exams without inflammatory changes or dysplasia.  He prefers not to have a routine repeat colonoscopy.  He understands that polyps or colorectal neoplasia and cancer could be missed.  He is off medication as well.  Previously on low-dose prednisone  as mesalamine discontinued due to chronic renal insufficiency.  He will continue omeprazole  for GERD.  He will continue dicyclomine  for IBS.  Chronic throat clearing advice given to the patient see patient instructions.  Follow-up as needed.  He understands to contact me/us  if he develops rectal bleeding, change in bowel habits significant abdominal pain.  CC: Theophilus Andrews, Tully GRADE, MD   Subjective:   Chief Complaint: Follow-up of ulcerative colitis  HPI 78 year old man with a history of chronic universal ulcerative colitis with last colonoscopy 2020 demonstrating normal mucosa on visualization and pathology.  He had had those findings for many years.  He is here today to discuss whether he should repeat a colonoscopy.  His father did have colon cancer in his 17s.  The patient is not experiencing any rectal bleeding or diarrhea or significant abdominal pain and is not inclined to proceed with a routine repeat colonoscopy.  He is experiencing some chronic throat clearing issues he says he has a lot of allergies.  Throat clearing has been a problem for a number of years now.  It is in the morning and usually gets better as the day goes on.  He is drinking tart cherry for gout I think and thinks that may exacerbate it.  He is on omeprazole  and does not have  heartburn problems. Wt Readings from Last 3 Encounters:  06/29/24 150 lb (68 kg)  04/06/24 145 lb 6.4 oz (66 kg)  04/01/24 152 lb (68.9 kg)    Lab Results  Component Value Date   HGB 13.6 06/01/2024    Allergies  Allergen Reactions   Novocain [Procaine] Palpitations    Lightheadedness    Promethazine Other (See Comments)    Family members had hallucinations when taking this, pt prefers to avoid this drug   Amlodipine  Swelling    Feet swelling   Aspirin     nose bleeds    Mercaptopurine     REACTION: myalgias   Tape     Tears skins, tolerates cloth and paper tape    Current Meds  Medication Sig   acetaminophen  (TYLENOL ) 650 MG CR tablet Take 1,300 mg by mouth at bedtime as needed for pain.   albuterol  (PROAIR  HFA) 108 (90 Base) MCG/ACT inhaler Inhale 2 puffs into the lungs every 6 (six) hours as needed for wheezing or shortness of breath. INHALE 2 PUFFS INTO THE LUNGS 4 TIMES DAILY   allopurinol  (ZYLOPRIM ) 300 MG tablet TAKE 1 TABLET BY MOUTH DAILY   ALPRAZolam  (XANAX ) 0.25 MG tablet TAKE 1 TABLET BY MOUTH EVERY NIGHT AT BEDTIME AS NEEDED FOR ANXIETY AND TAKE 1/2 TABLET DAILY AS NEEDED   amLODipine  (NORVASC ) 5 MG tablet TAKE 1 TABLET BY MOUTH DAILY   amphetamine -dextroamphetamine  (ADDERALL) 5 MG tablet Take 1 tablet (5 mg total) by mouth daily.   Ascorbic Acid (VITAMIN C) 100 MG tablet  Take 500 mg by mouth daily.   atorvastatin  (LIPITOR) 40 MG tablet TAKE 1 TABLET BY MOUTH DAILY   buPROPion  (WELLBUTRIN  XL) 300 MG 24 hr tablet TAKE 1 TABLET BY MOUTH DAILY   colchicine  0.6 MG tablet TAKE 1 TABLET BY MOUTH TWICE  DAILY   Cyanocobalamin  (B-12) 3000 MCG CAPS Take 3,000 mcg by mouth daily.   dicyclomine  (BENTYL ) 20 MG tablet TAKE 1 TABLET BY MOUTH EVERY 6  HOURS   FARXIGA 10 MG TABS tablet Take 10 mg by mouth daily.   fexofenadine  (ALLEGRA) 180 MG tablet Take 180 mg by mouth daily as needed for allergies.   fluticasone  (FLONASE ) 50 MCG/ACT nasal spray Place 1 spray into both  nostrils daily as needed for allergies or rhinitis.   Fluticasone  Furoate (ARNUITY ELLIPTA ) 50 MCG/ACT AEPB USE 1 INHALATION BY MOUTH DAILY   Multiple Vitamin (MULTIVITAMIN WITH MINERALS) TABS tablet Take 1 tablet by mouth daily.   NON FORMULARY Cherry juice   Olopatadine  HCl (PATADAY  OP) Place 1 drop into both eyes daily as needed (allergies / irritation).   omeprazole  (PRILOSEC) 20 MG capsule TAKE 1 CAPSULE BY MOUTH DAILY   polyethylene glycol (MIRALAX  / GLYCOLAX ) 17 g packet Take 17 g by mouth daily.   Simethicone  (GAS-X PO) Take 2 tablets by mouth daily as needed (gas).   tamsulosin (FLOMAX) 0.4 MG CAPS capsule Take 0.4 mg by mouth.   terbinafine  (LAMISIL ) 250 MG tablet Take 1 tablet (250 mg total) by mouth daily.   Vitamin D , Ergocalciferol , 50 MCG (2000 UT) CAPS Take 2,000 mg by mouth daily.   [DISCONTINUED] ketoconazole  (NIZORAL ) 2 % cream Apply 1 Application topically daily.   Past Medical History:  Diagnosis Date   ADD 02/23/2008   ANXIETY 08/03/2007   ASTHMA 08/03/2007   Asthma    Cancer (HCC)    skin cancer    CHEST PAIN 11/17/2008   Closed fracture of four ribs 06/30/2008   DEPRESSION 08/03/2007   ERECTILE DYSFUNCTION, ORGANIC 02/23/2008   Failed moderate sedation during procedure 11/02/2008   GERD 08/03/2007   HAND PAIN, LEFT 06/20/2009   Heart murmur    HIP PAIN, RIGHT 05/27/2008   HYPERLIPIDEMIA 08/03/2007   HYPERTENSION 08/03/2007   Irritable bowel syndrome 11/02/2008   OSTEOARTHRITIS 11/02/2008   OSTEOPENIA 07/26/2008   Pleurisy    RENAL DISEASE, CHRONIC, MILD 11/04/2008   UNIVERSAL ULCERATIVE COLITIS 01/26/2009   Past Surgical History:  Procedure Laterality Date   ANTERIOR FUSION CERVICAL SPINE  12/30/2010   BASAL CELL CARCINOMA EXCISION Left    ear, head   CARPAL TUNNEL RELEASE  12/30/2010   CERVICAL FUSION  04/30/2015   C7   COLONOSCOPY W/ BIOPSIES  06/2001, 08/2006, 07/2008   colon polyp, ulcerative colitis, lymphoid aggregates   ELBOW SURGERY   01/30/2002   left   HAND SURGERY  02/27/2010   basal thumb joint removal and tendon transplant([facility])   INCISION AND DRAINAGE PERIRECTAL ABSCESS  12/30/1996   LUMBAR DISC SURGERY  12/30/1990   LUMBAR LAMINECTOMY/DECOMPRESSION MICRODISCECTOMY N/A 07/18/2022   Procedure: Central decompression microdiscectomy Lumbsr two-three;  Surgeon: Duwayne Purchase, MD;  Location: [facility] OR;  Service: Orthopedics;  Laterality: N/A;   SHOULDER SURGERY Bilateral 09/30/2007   injury at work, dislocated   TONSILLECTOMY     removed as a child   UPPER GASTROINTESTINAL ENDOSCOPY  06/29/2001   2cm hiatal hernia   Social History  Married with 3 children-retired Never smoker no alcohol at this time no drug use  family history  includes COPD in his sister; Cancer in his father; Colon cancer (age of onset: 33) in his father; Heart disease in his brother and mother; Seizures (age of onset: 38) in his brother.   Review of Systems As per HPI  Objective:   Physical Exam @BP  120/60   Ht 5' 5 (1.651 m)   Wt 150 lb (68 kg)   BMI 24.96 kg/m @  General:  NAD Eyes:   anicteric Lungs:  clear Heart::  S1S2 no rubs, murmurs or gallops Abdomen:  soft and nontender, BS+ Ext:   no edema, cyanosis or clubbing    Data Reviewed:  As per HPI

## 2024-06-29 NOTE — Progress Notes (Signed)
 Joseph Mejia 78 y.o. 1946-06-01 [id number]  Assessment & Plan:      Subjective:   Chief Complaint:  HPI  Allergies  Allergen Reactions   Novocain [Procaine] Palpitations    Lightheadedness    Promethazine Other (See Comments)    Family members had hallucinations when taking this, pt prefers to avoid this drug   Amlodipine  Swelling    Feet swelling   Aspirin     nose bleeds    Mercaptopurine     REACTION: myalgias   Tape     Tears skins, tolerates cloth and paper tape    Current Meds  Medication Sig   acetaminophen  (TYLENOL ) 650 MG CR tablet Take 1,300 mg by mouth at bedtime as needed for pain.   albuterol  (PROAIR  HFA) 108 (90 Base) MCG/ACT inhaler Inhale 2 puffs into the lungs every 6 (six) hours as needed for wheezing or shortness of breath. INHALE 2 PUFFS INTO THE LUNGS 4 TIMES DAILY   allopurinol  (ZYLOPRIM ) 300 MG tablet TAKE 1 TABLET BY MOUTH DAILY   ALPRAZolam  (XANAX ) 0.25 MG tablet TAKE 1 TABLET BY MOUTH EVERY NIGHT AT BEDTIME AS NEEDED FOR ANXIETY AND TAKE 1/2 TABLET DAILY AS NEEDED   amLODipine  (NORVASC ) 5 MG tablet TAKE 1 TABLET BY MOUTH DAILY   amphetamine -dextroamphetamine  (ADDERALL) 5 MG tablet Take 1 tablet (5 mg total) by mouth daily.   Ascorbic Acid (VITAMIN C) 100 MG tablet Take 500 mg by mouth daily.   atorvastatin  (LIPITOR) 40 MG tablet TAKE 1 TABLET BY MOUTH DAILY   buPROPion  (WELLBUTRIN  XL) 300 MG 24 hr tablet TAKE 1 TABLET BY MOUTH DAILY   colchicine  0.6 MG tablet TAKE 1 TABLET BY MOUTH TWICE  DAILY   Cyanocobalamin  (B-12) 3000 MCG CAPS Take 3,000 mcg by mouth daily.   dicyclomine  (BENTYL ) 20 MG tablet TAKE 1 TABLET BY MOUTH EVERY 6  HOURS   FARXIGA 10 MG TABS tablet Take 10 mg by mouth daily.   fexofenadine  (ALLEGRA) 180 MG tablet Take 180 mg by mouth daily as needed for allergies.   fluticasone  (FLONASE ) 50 MCG/ACT nasal spray Place 1 spray into both nostrils daily as needed for allergies or rhinitis.   Fluticasone  Furoate (ARNUITY  ELLIPTA) 50 MCG/ACT AEPB USE 1 INHALATION BY MOUTH DAILY   Multiple Vitamin (MULTIVITAMIN WITH MINERALS) TABS tablet Take 1 tablet by mouth daily.   NON FORMULARY Cherry juice   Olopatadine  HCl (PATADAY  OP) Place 1 drop into both eyes daily as needed (allergies / irritation).   omeprazole  (PRILOSEC) 20 MG capsule TAKE 1 CAPSULE BY MOUTH DAILY   polyethylene glycol (MIRALAX  / GLYCOLAX ) 17 g packet Take 17 g by mouth daily.   Simethicone  (GAS-X PO) Take 2 tablets by mouth daily as needed (gas).   tamsulosin (FLOMAX) 0.4 MG CAPS capsule Take 0.4 mg by mouth.   terbinafine  (LAMISIL ) 250 MG tablet Take 1 tablet (250 mg total) by mouth daily.   Vitamin D , Ergocalciferol , 50 MCG (2000 UT) CAPS Take 2,000 mg by mouth daily.   [DISCONTINUED] ketoconazole  (NIZORAL ) 2 % cream Apply 1 Application topically daily.   Past Medical History:  Diagnosis Date   ADD 02/23/2008   ANXIETY 08/03/2007   ASTHMA 08/03/2007   Asthma    Cancer (HCC)    skin cancer    CHEST PAIN 11/17/2008   Closed fracture of four ribs 06/30/2008   DEPRESSION 08/03/2007   ERECTILE DYSFUNCTION, ORGANIC 02/23/2008   Failed moderate sedation during procedure 11/02/2008   GERD 08/03/2007  HAND PAIN, LEFT 06/20/2009   Heart murmur    HIP PAIN, RIGHT 05/27/2008   HYPERLIPIDEMIA 08/03/2007   HYPERTENSION 08/03/2007   Irritable bowel syndrome 11/02/2008   OSTEOARTHRITIS 11/02/2008   OSTEOPENIA 07/26/2008   Pleurisy    RENAL DISEASE, CHRONIC, MILD 11/04/2008   UNIVERSAL ULCERATIVE COLITIS 01/26/2009   Past Surgical History:  Procedure Laterality Date   ANTERIOR FUSION CERVICAL SPINE  12/30/2010   BASAL CELL CARCINOMA EXCISION Left    ear, head   CARPAL TUNNEL RELEASE  12/30/2010   CERVICAL FUSION  04/30/2015   C7   COLONOSCOPY W/ BIOPSIES  06/2001, 08/2006, 07/2008   colon polyp, ulcerative colitis, lymphoid aggregates   ELBOW SURGERY  01/30/2002   left   HAND SURGERY  02/27/2010   basal thumb joint removal and tendon  transplant([facility])   INCISION AND DRAINAGE PERIRECTAL ABSCESS  12/30/1996   LUMBAR DISC SURGERY  12/30/1990   LUMBAR LAMINECTOMY/DECOMPRESSION MICRODISCECTOMY N/A 07/18/2022   Procedure: Central decompression microdiscectomy Lumbsr two-three;  Surgeon: Duwayne Purchase, MD;  Location: [facility] OR;  Service: Orthopedics;  Laterality: N/A;   SHOULDER SURGERY Bilateral 09/30/2007   injury at work, dislocated   TONSILLECTOMY     removed as a child   UPPER GASTROINTESTINAL ENDOSCOPY  06/29/2001   2cm hiatal hernia   Social History   Social History Narrative   Not on file   family history includes COPD in his sister; Cancer in his father; Colon cancer (age of onset: 57) in his father; Heart disease in his brother and mother; Seizures (age of onset: 58) in his brother.   Review of Systems   Objective:   Physical Exam

## 2024-07-03 ENCOUNTER — Other Ambulatory Visit: Payer: Self-pay | Admitting: Internal Medicine

## 2024-07-27 ENCOUNTER — Encounter: Payer: Self-pay | Admitting: Internal Medicine

## 2024-07-27 ENCOUNTER — Ambulatory Visit (INDEPENDENT_AMBULATORY_CARE_PROVIDER_SITE_OTHER): Admitting: Internal Medicine

## 2024-07-27 VITALS — BP 120/70 | HR 82 | Temp 98.2°F | Ht 65.5 in | Wt 151.5 lb

## 2024-07-27 DIAGNOSIS — M1A9XX0 Chronic gout, unspecified, without tophus (tophi): Secondary | ICD-10-CM | POA: Diagnosis not present

## 2024-07-27 DIAGNOSIS — I10 Essential (primary) hypertension: Secondary | ICD-10-CM | POA: Diagnosis not present

## 2024-07-27 DIAGNOSIS — F900 Attention-deficit hyperactivity disorder, predominantly inattentive type: Secondary | ICD-10-CM

## 2024-07-27 DIAGNOSIS — E785 Hyperlipidemia, unspecified: Secondary | ICD-10-CM

## 2024-07-27 DIAGNOSIS — E559 Vitamin D deficiency, unspecified: Secondary | ICD-10-CM

## 2024-07-27 DIAGNOSIS — F411 Generalized anxiety disorder: Secondary | ICD-10-CM | POA: Diagnosis not present

## 2024-07-27 DIAGNOSIS — Z0000 Encounter for general adult medical examination without abnormal findings: Secondary | ICD-10-CM

## 2024-07-27 LAB — COMPREHENSIVE METABOLIC PANEL WITH GFR
ALT: 26 U/L (ref 0–53)
AST: 26 U/L (ref 0–37)
Albumin: 4 g/dL (ref 3.5–5.2)
Alkaline Phosphatase: 102 U/L (ref 39–117)
BUN: 20 mg/dL (ref 6–23)
CO2: 27 meq/L (ref 19–32)
Calcium: 9 mg/dL (ref 8.4–10.5)
Chloride: 104 meq/L (ref 96–112)
Creatinine, Ser: 1.98 mg/dL — ABNORMAL HIGH (ref 0.40–1.50)
GFR: 31.91 mL/min — ABNORMAL LOW (ref >60.00)
Glucose, Bld: 94 mg/dL (ref 70–99)
Potassium: 4.5 meq/L (ref 3.5–5.1)
Sodium: 143 meq/L (ref 135–145)
Total Bilirubin: 0.5 mg/dL (ref 0.2–1.2)
Total Protein: 6.3 g/dL (ref 6.0–8.3)

## 2024-07-27 LAB — LIPID PANEL
Cholesterol: 155 mg/dL (ref 0–200)
HDL: 50.6 mg/dL
LDL Cholesterol: 89 mg/dL (ref 0–99)
NonHDL: 104.22
Total CHOL/HDL Ratio: 3
Triglycerides: 75 mg/dL (ref 0.0–149.0)
VLDL: 15 mg/dL (ref 0.0–40.0)

## 2024-07-27 LAB — CBC WITH DIFFERENTIAL/PLATELET
Basophils Absolute: 0 K/uL (ref 0.0–0.1)
Basophils Relative: 0.6 % (ref 0.0–3.0)
Eosinophils Absolute: 0.2 K/uL (ref 0.0–0.7)
Eosinophils Relative: 3.8 % (ref 0.0–5.0)
HCT: 44 % (ref 39.0–52.0)
Hemoglobin: 14.5 g/dL (ref 13.0–17.0)
Lymphocytes Relative: 33 % (ref 12.0–46.0)
Lymphs Abs: 1.9 K/uL (ref 0.7–4.0)
MCHC: 33 g/dL (ref 30.0–36.0)
MCV: 96.4 fl (ref 78.0–100.0)
Monocytes Absolute: 0.6 K/uL (ref 0.1–1.0)
Monocytes Relative: 10.4 % (ref 3.0–12.0)
Neutro Abs: 3 K/uL (ref 1.4–7.7)
Neutrophils Relative %: 52.2 % (ref 43.0–77.0)
Platelets: 183 K/uL (ref 150.0–400.0)
RBC: 4.56 Mil/uL (ref 4.22–5.81)
RDW: 14.7 % (ref 11.5–15.5)
WBC: 5.7 K/uL (ref 4.0–10.5)

## 2024-07-27 LAB — TSH: TSH: 2.7 u[IU]/mL (ref 0.35–5.50)

## 2024-07-27 LAB — VITAMIN D 25 HYDROXY (VIT D DEFICIENCY, FRACTURES): VITD: 51.36 ng/mL (ref 30.00–100.00)

## 2024-07-27 LAB — PSA: PSA: 0.73 ng/mL (ref 0.10–4.00)

## 2024-07-27 LAB — VITAMIN B12: Vitamin B-12: 1385 pg/mL — ABNORMAL HIGH (ref 211–911)

## 2024-07-27 MED ORDER — BUPROPION HCL ER (XL) 300 MG PO TB24: 300.0000 mg | ORAL_TABLET | Freq: Every day | ORAL | 1 refills | Status: DC

## 2024-07-27 MED ORDER — AMPHETAMINE-DEXTROAMPHETAMINE 5 MG PO TABS
5.0000 mg | ORAL_TABLET | Freq: Every day | ORAL | 0 refills | Status: DC
Start: 2024-07-27 — End: 2024-11-09

## 2024-07-27 MED ORDER — AMPHETAMINE-DEXTROAMPHETAMINE 5 MG PO TABS: 5.0000 mg | ORAL_TABLET | Freq: Every day | ORAL | 0 refills | Status: DC

## 2024-07-27 MED ORDER — AMLODIPINE BESYLATE 5 MG PO TABS: 5.0000 mg | ORAL_TABLET | Freq: Every day | ORAL | 1 refills | Status: AC

## 2024-07-27 MED ORDER — ATORVASTATIN CALCIUM 40 MG PO TABS
40.0000 mg | ORAL_TABLET | Freq: Every day | ORAL | 1 refills | Status: AC
  Filled 2025-01-05 – 2025-01-15 (×2): qty 100, 100d supply, fill #0

## 2024-07-27 MED ORDER — TAMSULOSIN HCL 0.4 MG PO CAPS: 0.4000 mg | ORAL_CAPSULE | Freq: Every day | ORAL | 1 refills | Status: DC

## 2024-07-27 MED ORDER — ALPRAZOLAM 0.25 MG PO TABS: ORAL_TABLET | ORAL | 1 refills | Status: DC

## 2024-07-27 MED ORDER — ALLOPURINOL 300 MG PO TABS
300.0000 mg | ORAL_TABLET | Freq: Every day | ORAL | 1 refills | Status: DC
Start: 2024-07-27 — End: 2024-08-26

## 2024-07-27 MED ORDER — OMEPRAZOLE 20 MG PO CPDR
20.0000 mg | DELAYED_RELEASE_CAPSULE | Freq: Every day | ORAL | 1 refills | Status: AC
  Filled 2025-01-05: qty 100, 100d supply, fill #0

## 2024-07-27 NOTE — Addendum Note (Signed)
 Addended by: KATHRYNE MILLMAN B on: 07/27/2024 09:31 AM   Modules accepted: Orders

## 2024-07-27 NOTE — Progress Notes (Signed)
 Established Patient Office Visit     CC/Reason for Visit: Annual preventive exam  HPI: Joseph Mejia is a 78 y.o. male who is coming in today for the above mentioned reasons. Past Medical History is significant for: ADHD, depression, hypertension, gout.  Feeling well without concerns or complaints.  Has routine eye and dental care.  Immunizations are up-to-date.  Had a colonoscopy in 2020 and was advised no further due to age.   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   Failed moderate sedation during procedure 11/02/2008   GERD 08/03/2007   HAND PAIN, LEFT 06/20/2009   Heart murmur    HIP PAIN, RIGHT 05/27/2008   HYPERLIPIDEMIA 08/03/2007   HYPERTENSION 08/03/2007   Irritable bowel syndrome 11/02/2008   OSTEOARTHRITIS 11/02/2008   OSTEOPENIA 07/26/2008   Pleurisy    RENAL DISEASE, CHRONIC, MILD 11/04/2008   UNIVERSAL ULCERATIVE COLITIS 01/26/2009    Past Surgical History:  Procedure Laterality Date   ANTERIOR FUSION CERVICAL SPINE  12/30/2010   BASAL CELL CARCINOMA EXCISION Left    ear, head   CARPAL TUNNEL RELEASE  12/30/2010   CERVICAL FUSION  04/30/2015   C7   COLONOSCOPY W/ BIOPSIES  06/2001, 08/2006, 07/2008   colon polyp, ulcerative colitis, lymphoid aggregates   ELBOW SURGERY  01/30/2002   left   HAND SURGERY  02/27/2010   basal thumb joint removal and tendon transplant([facility])   INCISION AND DRAINAGE PERIRECTAL ABSCESS  12/30/1996   LUMBAR DISC SURGERY  12/30/1990   LUMBAR LAMINECTOMY/DECOMPRESSION MICRODISCECTOMY N/A 07/18/2022   Procedure: Central decompression microdiscectomy Lumbsr two-three;  Surgeon: Duwayne Purchase, MD;  Location: [facility] OR;  Service: Orthopedics;  Laterality: N/A;    SHOULDER SURGERY Bilateral 09/30/2007   injury at work, dislocated   TONSILLECTOMY     removed as a child   UPPER GASTROINTESTINAL ENDOSCOPY  06/29/2001   2cm hiatal hernia    Social History:  reports that he has never smoked. He has never used smokeless tobacco. He reports that he does not currently use alcohol. He reports that he does not use drugs.  Allergies: Allergies  Allergen Reactions   Novocain [Procaine] Palpitations    Lightheadedness    Promethazine Other (See Comments)    Family members had hallucinations when taking this, pt prefers to avoid this drug   Amlodipine  Swelling    Feet swelling   Aspirin     nose bleeds    Mercaptopurine     REACTION: myalgias   Tape     Tears skins, tolerates cloth and paper tape     Family History:  Family History  Problem Relation Age of Onset   Heart disease Mother        s/p CABG age 12   Cancer Father        colon , brain   Colon cancer Father 36   COPD Sister    Heart disease Brother        CAD   Seizures Brother 77   Colon polyps Neg Hx    Esophageal cancer Neg Hx    Rectal cancer Neg Hx    Stomach cancer Neg Hx      Current Outpatient Medications:  acetaminophen  (TYLENOL ) 650 MG CR tablet, Take 1,300 mg by mouth at bedtime as needed for pain., Disp: , Rfl:    albuterol  (PROAIR  HFA) 108 (90 Base) MCG/ACT inhaler, Inhale 2 puffs into the lungs every 6 (six) hours as needed for wheezing or shortness of breath. INHALE 2 PUFFS INTO THE LUNGS 4 TIMES DAILY, Disp: 25.5 each, Rfl: 0   allopurinol  (ZYLOPRIM ) 300 MG tablet, TAKE 1 TABLET BY MOUTH DAILY, Disp: 90 tablet, Rfl: 0   amLODipine  (NORVASC ) 5 MG tablet, TAKE 1 TABLET BY MOUTH DAILY, Disp: 90 tablet, Rfl: 0   Ascorbic Acid (VITAMIN C) 100 MG tablet, Take 500 mg by mouth daily., Disp: , Rfl:    atorvastatin  (LIPITOR) 40 MG tablet, TAKE 1 TABLET BY MOUTH DAILY, Disp: 100 tablet, Rfl: 0   buPROPion  (WELLBUTRIN  XL) 300 MG 24 hr tablet, TAKE 1 TABLET BY MOUTH  DAILY, Disp: 100 tablet, Rfl: 0   colchicine  0.6 MG tablet, TAKE 1 TABLET BY MOUTH TWICE  DAILY, Disp: 120 tablet, Rfl: 0   Cyanocobalamin  (B-12) 3000 MCG CAPS, Take 3,000 mcg by mouth daily., Disp: , Rfl:    dicyclomine  (BENTYL ) 20 MG tablet, TAKE 1 TABLET BY MOUTH EVERY 6  HOURS, Disp: 400 tablet, Rfl: 0   FARXIGA 10 MG TABS tablet, Take 10 mg by mouth daily., Disp: , Rfl:    fexofenadine  (ALLEGRA) 180 MG tablet, Take 180 mg by mouth daily as needed for allergies., Disp: , Rfl:    fluticasone  (FLONASE ) 50 MCG/ACT nasal spray, Place 1 spray into both nostrils daily as needed for allergies or rhinitis., Disp: , Rfl:    Fluticasone  Furoate (ARNUITY ELLIPTA ) 50 MCG/ACT AEPB, USE 1 INHALATION BY MOUTH DAILY, Disp: 90 each, Rfl: 0   Multiple Vitamin (MULTIVITAMIN WITH MINERALS) TABS tablet, Take 1 tablet by mouth daily., Disp: , Rfl:    NON FORMULARY, Cherry juice, Disp: , Rfl:    Olopatadine  HCl (PATADAY  OP), Place 1 drop into both eyes daily as needed (allergies / irritation)., Disp: , Rfl:    omeprazole  (PRILOSEC) 20 MG capsule, TAKE 1 CAPSULE BY MOUTH DAILY, Disp: 100 capsule, Rfl: 0   polyethylene glycol (MIRALAX  / GLYCOLAX ) 17 g packet, Take 17 g by mouth daily., Disp: 14 each, Rfl: 0   Simethicone  (GAS-X PO), Take 2 tablets by mouth daily as needed (gas)., Disp: , Rfl:    tamsulosin  (FLOMAX ) 0.4 MG CAPS capsule, Take 0.4 mg by mouth., Disp: , Rfl:    terbinafine  (LAMISIL ) 250 MG tablet, Take 1 tablet (250 mg total) by mouth daily., Disp: 30 tablet, Rfl: 0   Vitamin D , Ergocalciferol , 50 MCG (2000 UT) CAPS, Take 2,000 mg by mouth daily., Disp: , Rfl:    ALPRAZolam  (XANAX ) 0.25 MG tablet, TAKE 1 TABLET BY MOUTH EVERY NIGHT AT BEDTIME AS NEEDED FOR ANXIETY AND TAKE 1/2 TABLET DAILY AS NEEDED, Disp: 30 tablet, Rfl: 1   amphetamine -dextroamphetamine  (ADDERALL) 5 MG tablet, Take 1 tablet (5 mg total) by mouth daily., Disp: 30 tablet, Rfl: 0   amphetamine -dextroamphetamine  (ADDERALL) 5 MG tablet, Take  1 tablet (5 mg total) by mouth daily., Disp: 30 tablet, Rfl: 0   amphetamine -dextroamphetamine  (ADDERALL) 5 MG tablet, Take 1 tablet (5 mg total) by mouth daily., Disp: 30 tablet, Rfl: 0  Review of Systems:  Negative unless indicated in HPI.   Physical Exam: Vitals:   07/27/24 0850  BP: 120/70  Pulse: 82  Temp: 98.2 F (36.8 C)  TempSrc: Oral  SpO2: 98%  Weight: 151  lb 8 oz (68.7 kg)  Height: 5' 5.5 (1.664 m)    Body mass index is 24.83 kg/m.   Physical Exam Vitals reviewed.  Constitutional:      General: He is not in acute distress.    Appearance: Normal appearance. He is not ill-appearing, toxic-appearing or diaphoretic.  HENT:     Head: Normocephalic.     Right Ear: Tympanic membrane, ear canal and external ear normal. There is no impacted cerumen.     Left Ear: Tympanic membrane, ear canal and external ear normal. There is no impacted cerumen.     Nose: Nose normal.     Mouth/Throat:     Mouth: Mucous membranes are moist.     Pharynx: Oropharynx is clear. No oropharyngeal exudate or posterior oropharyngeal erythema.  Eyes:     General: No scleral icterus.       Right eye: No discharge.        Left eye: No discharge.     Conjunctiva/sclera: Conjunctivae normal.     Pupils: Pupils are equal, round, and reactive to light.  Neck:     Vascular: No carotid bruit.  Cardiovascular:     Rate and Rhythm: Normal rate and regular rhythm.     Pulses: Normal pulses.     Heart sounds: Normal heart sounds.  Pulmonary:     Effort: Pulmonary effort is normal. No respiratory distress.     Breath sounds: Normal breath sounds.  Abdominal:     General: Abdomen is flat. Bowel sounds are normal.     Palpations: Abdomen is soft.  Musculoskeletal:        General: Normal range of motion.     Cervical back: Normal range of motion.  Skin:    General: Skin is warm and dry.  Neurological:     General: No focal deficit present.     Mental Status: He is alert and oriented to person,  place, and time. Mental status is at baseline.  Psychiatric:        Mood and Affect: Mood normal.        Behavior: Behavior normal.        Thought Content: Thought content normal.        Judgment: Judgment normal.      Impression and Plan:  Encounter for preventive health examination -     CBC with Differential/Platelet; Future -     Comprehensive metabolic panel with GFR; Future -     PSA; Future -     TSH; Future -     Vitamin B12; Future  Essential hypertension  Chronic gout without tophus, unspecified cause, unspecified site  Vitamin D  deficiency -     VITAMIN D  25 Hydroxy (Vit-D Deficiency, Fractures); Future  Anxiety state -     ALPRAZolam ; TAKE 1 TABLET BY MOUTH EVERY NIGHT AT BEDTIME AS NEEDED FOR ANXIETY AND TAKE 1/2 TABLET DAILY AS NEEDED  Dispense: 30 tablet; Refill: 1  Attention deficit hyperactivity disorder (ADHD), predominantly inattentive type -     Amphetamine -Dextroamphetamine ; Take 1 tablet (5 mg total) by mouth daily.  Dispense: 30 tablet; Refill: 0 -     Amphetamine -Dextroamphetamine ; Take 1 tablet (5 mg total) by mouth daily.  Dispense: 30 tablet; Refill: 0 -     Amphetamine -Dextroamphetamine ; Take 1 tablet (5 mg total) by mouth daily.  Dispense: 30 tablet; Refill: 0  Dyslipidemia -     Lipid panel; Future   -Recommend routine eye and dental care. -Healthy lifestyle discussed in detail. -  Labs to be updated today. -Prostate cancer screening: PSA today Health Maintenance  Topic Date Due   COVID-19 Vaccine (4 - 2024-25 season) 08/31/2023   Colon Cancer Screening  07/27/2025*   Flu Shot  07/30/2024   Medicare Annual Wellness Visit  04/01/2025   DTaP/Tdap/Td vaccine (3 - Td or Tdap) 09/01/2032   Pneumococcal Vaccine for age over 33  Completed   Hepatitis C Screening  Completed   Zoster (Shingles) Vaccine  Completed   Hepatitis B Vaccine  Aged Out   HPV Vaccine  Aged Out   Meningitis B Vaccine  Aged Out  *Topic was postponed. The date shown is not  the original due date.       Tully Theophilus Andrews, MD [facility]

## 2024-07-28 ENCOUNTER — Ambulatory Visit: Payer: Self-pay | Admitting: Internal Medicine

## 2024-07-30 ENCOUNTER — Other Ambulatory Visit: Payer: Self-pay | Admitting: Internal Medicine

## 2024-07-30 DIAGNOSIS — F411 Generalized anxiety disorder: Secondary | ICD-10-CM

## 2024-08-16 ENCOUNTER — Other Ambulatory Visit: Payer: Self-pay | Admitting: Internal Medicine

## 2024-08-16 DIAGNOSIS — J4520 Mild intermittent asthma, uncomplicated: Secondary | ICD-10-CM

## 2024-08-26 ENCOUNTER — Other Ambulatory Visit: Payer: Self-pay | Admitting: Internal Medicine

## 2024-08-26 DIAGNOSIS — M1A9XX0 Chronic gout, unspecified, without tophus (tophi): Secondary | ICD-10-CM

## 2024-10-13 DIAGNOSIS — Q619 Cystic kidney disease, unspecified: Secondary | ICD-10-CM | POA: Diagnosis not present

## 2024-11-05 ENCOUNTER — Other Ambulatory Visit: Payer: Self-pay | Admitting: Internal Medicine

## 2024-11-05 DIAGNOSIS — F411 Generalized anxiety disorder: Secondary | ICD-10-CM

## 2024-11-05 NOTE — Telephone Encounter (Signed)
 Copied from CRM [id number]. Topic: Clinical - Medication Refill >> Nov 05, 2024  9:23 AM Viola F wrote: Medication: ALPRAZolam  (XANAX ) 0.25 MG tablet [[id number]]   Has the patient contacted their pharmacy? Yes (Agent: If no, request that the patient contact the pharmacy for the refill. If patient does not wish to contact the pharmacy document the reason why and proceed with request.) (Agent: If yes, when and what did the pharmacy advise?)  This is the patient's preferred pharmacy:  [facility] [id number] - [place], [place] - [address] [place] [address] Phone: [phone] Fax: [phone]   Is this the correct pharmacy for this prescription? Yes If no, delete pharmacy and type the correct one.   Has the prescription been filled recently? Yes  Is the patient out of the medication? No, will be out out Monday   Has the patient been seen for an appointment in the last year OR does the patient have an upcoming appointment? Yes  Can we respond through MyChart? Yes  Agent: Please be advised that Rx refills may take up to 3 business days. We ask that you follow-up with your pharmacy.

## 2024-11-08 MED ORDER — ALPRAZOLAM 0.25 MG PO TABS: ORAL_TABLET | ORAL | 1 refills | Status: AC

## 2024-11-09 ENCOUNTER — Other Ambulatory Visit: Payer: Self-pay | Admitting: Internal Medicine

## 2024-11-09 DIAGNOSIS — F900 Attention-deficit hyperactivity disorder, predominantly inattentive type: Secondary | ICD-10-CM

## 2024-11-09 MED ORDER — AMPHETAMINE-DEXTROAMPHETAMINE 5 MG PO TABS: 5.0000 mg | ORAL_TABLET | Freq: Every day | ORAL | 0 refills | Status: AC

## 2024-11-09 NOTE — Telephone Encounter (Signed)
 Copied from CRM [id number]. Topic: Clinical - Medication Refill >> Nov 09, 2024  9:10 AM Carlyon D wrote: Medication: amphetamine -dextroamphetamine  (ADDERALL) 5 MG tablet  States 3 different scripts are sent   Has the patient contacted their pharmacy? No (Agent: If no, request that the patient contact the pharmacy for the refill. If patient does not wish to contact the pharmacy document the reason why and proceed with request.) (Agent: If yes, when and what did the pharmacy advise?)  This is the patient's preferred pharmacy:  [id number] - [place], [place] - [address] [place] [address] Phone: [phone] Fax: [phone]    Is this the correct pharmacy for this prescription? Yes If no, delete pharmacy and type the correct one.   Has the prescription been filled recently? No  Is the patient out of the medication? Yes  Has the patient been seen for an appointment in the last year OR does the patient have an upcoming appointment? Yes  Can we respond through MyChart? No, prefers phone call on wifes cell [phone]   Agent: Please be advised that Rx refills may take up to 3 business days. We ask that you follow-up with your pharmacy.

## 2024-11-23 LAB — COMPREHENSIVE METABOLIC PANEL WITH GFR
Albumin: 4 (ref 3.5–5.0)
Calcium: 9.4 (ref 8.7–10.7)
eGFR: 38

## 2024-11-23 LAB — BASIC METABOLIC PANEL WITH GFR
BUN: 23 — AB (ref 4–21)
CO2: 24 — AB (ref 13–22)
Chloride: 103 (ref 99–108)
Creatinine: 1.8 — AB (ref 0.6–1.3)
Glucose: 96
Potassium: 4.9 meq/L (ref 3.5–5.1)
Sodium: 142 (ref 137–147)

## 2024-11-23 LAB — CBC AND DIFFERENTIAL: Hemoglobin: 15 (ref 13.5–17.5)

## 2024-12-07 ENCOUNTER — Encounter: Payer: Self-pay | Admitting: Internal Medicine

## 2024-12-13 ENCOUNTER — Other Ambulatory Visit: Payer: Self-pay | Admitting: Internal Medicine

## 2024-12-13 DIAGNOSIS — F900 Attention-deficit hyperactivity disorder, predominantly inattentive type: Secondary | ICD-10-CM

## 2024-12-13 NOTE — Telephone Encounter (Unsigned)
 Copied from CRM #[id number]. Topic: Clinical - Medication Refill >> Dec 13, 2024  1:19 PM Montie POUR wrote: Medication:  amphetamine -dextroamphetamine  (ADDERALL) 5 MG tablet  - Please send in with 2-3 refills.   Has the patient contacted their pharmacy? Yes (Agent: If no, request that the patient contact the pharmacy for the refill. If patient does not wish to contact the pharmacy document the reason why and proceed with request.) (Agent: If yes, when and what did the pharmacy advise?) Pharmacy needs order to refill  This is the patient's preferred pharmacy:  [facility] [id number] - [place], [place] - [address] [place] [address] Phone: [phone] Fax: [phone]  Is this the correct pharmacy for this prescription? Yes If no, delete pharmacy and type the correct one.   Has the prescription been filled recently? No  Is the patient out of the medication? Yes - He ran out Saturday (12/11/24)  Has the patient been seen for an appointment in the last year OR does the patient have an upcoming appointment? Yes  Can we respond through MyChart? Yes  Agent: Please be advised that Rx refills may take up to 3 business days. We ask that you follow-up with your pharmacy.

## 2024-12-13 NOTE — Telephone Encounter (Signed)
 Pt requesting refills attached

## 2024-12-14 NOTE — Telephone Encounter (Signed)
 Please deny.  Refills sent 11/09/24

## 2024-12-25 ENCOUNTER — Other Ambulatory Visit: Payer: Self-pay | Admitting: Internal Medicine

## 2025-01-01 ENCOUNTER — Other Ambulatory Visit (HOSPITAL_COMMUNITY): Payer: Self-pay

## 2025-01-03 ENCOUNTER — Other Ambulatory Visit (HOSPITAL_COMMUNITY): Payer: Self-pay

## 2025-01-05 ENCOUNTER — Other Ambulatory Visit (HOSPITAL_BASED_OUTPATIENT_CLINIC_OR_DEPARTMENT_OTHER): Payer: Self-pay

## 2025-01-05 ENCOUNTER — Encounter: Payer: Self-pay | Admitting: Pharmacist

## 2025-01-05 ENCOUNTER — Other Ambulatory Visit (HOSPITAL_COMMUNITY): Payer: Self-pay

## 2025-01-05 ENCOUNTER — Other Ambulatory Visit: Payer: Self-pay

## 2025-01-05 MED ORDER — ALLOPURINOL 300 MG PO TABS
300.0000 mg | ORAL_TABLET | Freq: Every day | ORAL | 1 refills | Status: AC
  Filled 2025-01-05: qty 100, 100d supply, fill #0

## 2025-01-05 MED ORDER — DAPAGLIFLOZIN PROPANEDIOL 10 MG PO TABS
10.0000 mg | ORAL_TABLET | Freq: Every day | ORAL | 5 refills | Status: AC
  Filled 2025-01-05: qty 100, 100d supply, fill #0

## 2025-01-05 MED ORDER — LISINOPRIL 20 MG PO TABS
20.0000 mg | ORAL_TABLET | Freq: Every day | ORAL | 3 refills | Status: AC
  Filled 2025-01-05: qty 100, 100d supply, fill #0

## 2025-01-05 MED FILL — Tamsulosin HCl Cap 0.4 MG: ORAL | 100 days supply | Qty: 100 | Fill #0 | Status: AC

## 2025-01-05 MED FILL — Colchicine Tab 0.6 MG: ORAL | 60 days supply | Qty: 120 | Fill #0 | Status: CN

## 2025-01-15 ENCOUNTER — Other Ambulatory Visit (HOSPITAL_COMMUNITY): Payer: Self-pay

## 2025-01-21 ENCOUNTER — Other Ambulatory Visit: Payer: Self-pay | Admitting: Internal Medicine

## 2025-02-01 ENCOUNTER — Other Ambulatory Visit: Payer: Self-pay | Admitting: Internal Medicine

## 2025-02-01 DIAGNOSIS — J4520 Mild intermittent asthma, uncomplicated: Secondary | ICD-10-CM
